# Patient Record
Sex: Female | Born: 1955 | Race: White | Hispanic: No | Marital: Married | Smoking: Never smoker
Health system: Southern US, Community
[De-identification: ages and names within clinical notes are randomized; demographics above are authoritative.]

## PROBLEM LIST (undated history)

## (undated) DIAGNOSIS — M1990 Unspecified osteoarthritis, unspecified site: Secondary | ICD-10-CM

## (undated) DIAGNOSIS — K219 Gastro-esophageal reflux disease without esophagitis: Secondary | ICD-10-CM

## (undated) DIAGNOSIS — Z9889 Other specified postprocedural states: Secondary | ICD-10-CM

## (undated) DIAGNOSIS — E079 Disorder of thyroid, unspecified: Secondary | ICD-10-CM

## (undated) DIAGNOSIS — Z923 Personal history of irradiation: Secondary | ICD-10-CM

## (undated) DIAGNOSIS — N189 Chronic kidney disease, unspecified: Secondary | ICD-10-CM

## (undated) DIAGNOSIS — E785 Hyperlipidemia, unspecified: Secondary | ICD-10-CM

## (undated) DIAGNOSIS — D649 Anemia, unspecified: Secondary | ICD-10-CM

## (undated) DIAGNOSIS — I10 Essential (primary) hypertension: Secondary | ICD-10-CM

## (undated) DIAGNOSIS — M109 Gout, unspecified: Secondary | ICD-10-CM

## (undated) DIAGNOSIS — G709 Myoneural disorder, unspecified: Secondary | ICD-10-CM

## (undated) DIAGNOSIS — R112 Nausea with vomiting, unspecified: Secondary | ICD-10-CM

## (undated) DIAGNOSIS — C50919 Malignant neoplasm of unspecified site of unspecified female breast: Secondary | ICD-10-CM

## (undated) DIAGNOSIS — M545 Low back pain, unspecified: Secondary | ICD-10-CM

## (undated) HISTORY — DX: Unspecified osteoarthritis, unspecified site: M19.90

## (undated) HISTORY — PX: SHOULDER SURGERY: SHX246

## (undated) HISTORY — DX: Nausea with vomiting, unspecified: R11.2

## (undated) HISTORY — PX: ABLATION: SHX5711

## (undated) HISTORY — DX: Low back pain, unspecified: M54.50

## (undated) HISTORY — DX: Anemia, unspecified: D64.9

## (undated) HISTORY — DX: Essential (primary) hypertension: I10

## (undated) HISTORY — DX: Gout, unspecified: M10.9

## (undated) HISTORY — DX: Chronic kidney disease, unspecified: N18.9

## (undated) HISTORY — DX: Disorder of thyroid, unspecified: E07.9

## (undated) HISTORY — DX: Myoneural disorder, unspecified: G70.9

## (undated) HISTORY — DX: Hyperlipidemia, unspecified: E78.5

## (undated) HISTORY — DX: Malignant neoplasm of unspecified site of unspecified female breast: C50.919

## (undated) HISTORY — PX: COLONOSCOPY: SHX174

## (undated) HISTORY — DX: Gastro-esophageal reflux disease without esophagitis: K21.9

## (undated) HISTORY — PX: DILATION AND CURETTAGE OF UTERUS: SHX78

## (undated) HISTORY — DX: Other specified postprocedural states: Z98.890

---

## 1997-11-27 ENCOUNTER — Ambulatory Visit (HOSPITAL_COMMUNITY): Admission: RE | Admit: 1997-11-27 | Discharge: 1997-11-27 | Payer: Self-pay | Admitting: Obstetrics and Gynecology

## 1998-12-05 ENCOUNTER — Ambulatory Visit (HOSPITAL_COMMUNITY): Admission: RE | Admit: 1998-12-05 | Discharge: 1998-12-05 | Payer: Self-pay | Admitting: Obstetrics and Gynecology

## 1998-12-05 ENCOUNTER — Encounter: Payer: Self-pay | Admitting: Obstetrics and Gynecology

## 1999-12-07 ENCOUNTER — Encounter: Payer: Self-pay | Admitting: Obstetrics and Gynecology

## 1999-12-07 ENCOUNTER — Ambulatory Visit (HOSPITAL_COMMUNITY): Admission: RE | Admit: 1999-12-07 | Discharge: 1999-12-07 | Payer: Self-pay | Admitting: Obstetrics and Gynecology

## 2000-12-12 ENCOUNTER — Encounter: Payer: Self-pay | Admitting: Obstetrics and Gynecology

## 2000-12-12 ENCOUNTER — Ambulatory Visit (HOSPITAL_COMMUNITY): Admission: RE | Admit: 2000-12-12 | Discharge: 2000-12-12 | Payer: Self-pay | Admitting: Obstetrics and Gynecology

## 2001-10-31 ENCOUNTER — Ambulatory Visit (HOSPITAL_BASED_OUTPATIENT_CLINIC_OR_DEPARTMENT_OTHER): Admission: RE | Admit: 2001-10-31 | Discharge: 2001-10-31 | Payer: Self-pay | Admitting: Orthopedic Surgery

## 2001-12-13 ENCOUNTER — Encounter: Payer: Self-pay | Admitting: Obstetrics and Gynecology

## 2001-12-13 ENCOUNTER — Ambulatory Visit (HOSPITAL_COMMUNITY): Admission: RE | Admit: 2001-12-13 | Discharge: 2001-12-13 | Payer: Self-pay | Admitting: Obstetrics and Gynecology

## 2003-01-04 ENCOUNTER — Encounter: Payer: Self-pay | Admitting: Obstetrics and Gynecology

## 2003-01-04 ENCOUNTER — Ambulatory Visit (HOSPITAL_COMMUNITY): Admission: RE | Admit: 2003-01-04 | Discharge: 2003-01-04 | Payer: Self-pay | Admitting: Obstetrics and Gynecology

## 2003-12-11 ENCOUNTER — Encounter: Admission: RE | Admit: 2003-12-11 | Discharge: 2003-12-11 | Payer: Self-pay | Admitting: Family Medicine

## 2004-01-27 ENCOUNTER — Ambulatory Visit (HOSPITAL_COMMUNITY): Admission: RE | Admit: 2004-01-27 | Discharge: 2004-01-27 | Payer: Self-pay | Admitting: Obstetrics and Gynecology

## 2004-03-01 ENCOUNTER — Ambulatory Visit (HOSPITAL_COMMUNITY): Admission: AD | Admit: 2004-03-01 | Discharge: 2004-03-01 | Payer: Self-pay | Admitting: Obstetrics and Gynecology

## 2004-03-01 ENCOUNTER — Encounter (INDEPENDENT_AMBULATORY_CARE_PROVIDER_SITE_OTHER): Payer: Self-pay | Admitting: Specialist

## 2005-01-14 ENCOUNTER — Encounter: Admission: RE | Admit: 2005-01-14 | Discharge: 2005-01-14 | Payer: Self-pay | Admitting: Family Medicine

## 2005-02-19 ENCOUNTER — Ambulatory Visit (HOSPITAL_COMMUNITY): Admission: RE | Admit: 2005-02-19 | Discharge: 2005-02-19 | Payer: Self-pay | Admitting: Obstetrics and Gynecology

## 2006-02-25 ENCOUNTER — Ambulatory Visit (HOSPITAL_COMMUNITY): Admission: RE | Admit: 2006-02-25 | Discharge: 2006-02-25 | Payer: Self-pay | Admitting: Obstetrics and Gynecology

## 2006-03-09 ENCOUNTER — Ambulatory Visit: Payer: Self-pay | Admitting: Gastroenterology

## 2006-03-22 ENCOUNTER — Ambulatory Visit: Payer: Self-pay | Admitting: Gastroenterology

## 2006-07-14 ENCOUNTER — Ambulatory Visit (HOSPITAL_COMMUNITY): Admission: RE | Admit: 2006-07-14 | Discharge: 2006-07-14 | Payer: Self-pay | Admitting: Obstetrics and Gynecology

## 2007-03-06 ENCOUNTER — Ambulatory Visit (HOSPITAL_COMMUNITY): Admission: RE | Admit: 2007-03-06 | Discharge: 2007-03-06 | Payer: Self-pay | Admitting: Obstetrics and Gynecology

## 2007-03-14 ENCOUNTER — Encounter: Admission: RE | Admit: 2007-03-14 | Discharge: 2007-03-14 | Payer: Self-pay | Admitting: Obstetrics and Gynecology

## 2007-09-03 IMAGING — US US TRANSVAGINAL NON-OB
1 series · 13 of 25 positions shown · non-contrast
Comparison: none

CLINICAL DATA: Persistent bleeding since 06/06/06.  On birth control pills for 1 month.  
 TRANSABDOMINAL AND TRANSVAGINAL PELVIC ULTRASOUND:
TECHNIQUE: Both transabdominal and transvaginal ultrasound examinations of the pelvis were performed including evaluation of the uterus, ovaries, adnexal regions, and pelvic cul-de-sac.

[Series 1: us transvaginal non-ob · 0.30mm/px · 13 of 44 slices shown]
[im 1/44]
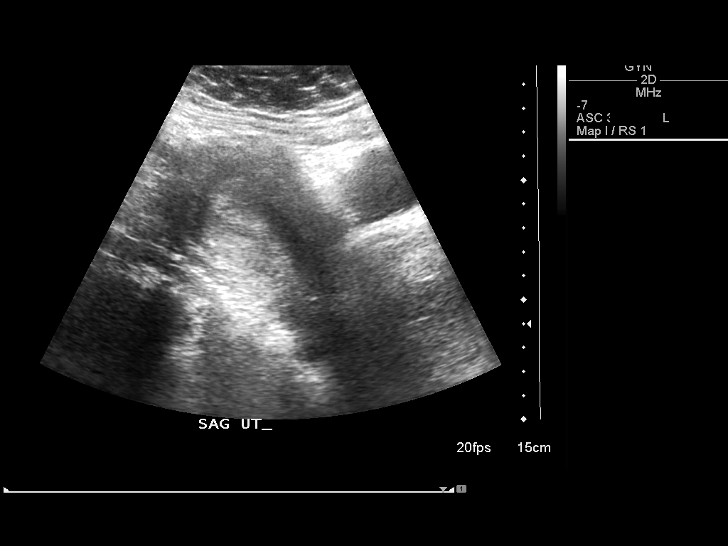
[im 4/44]
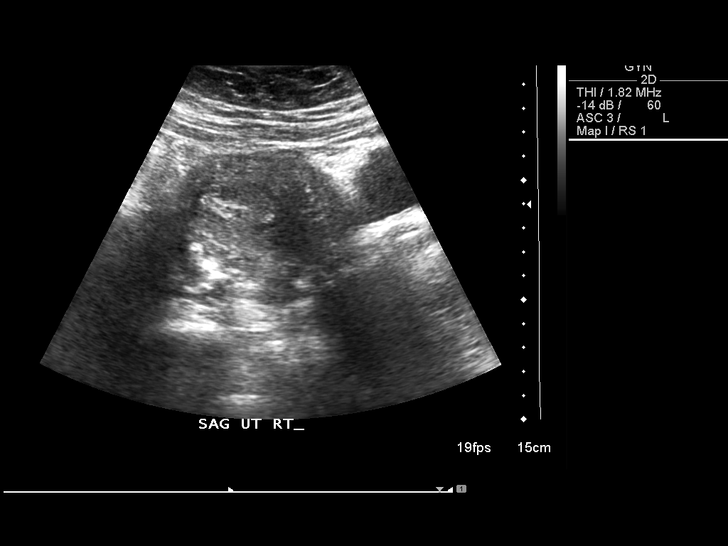
[im 8/44]
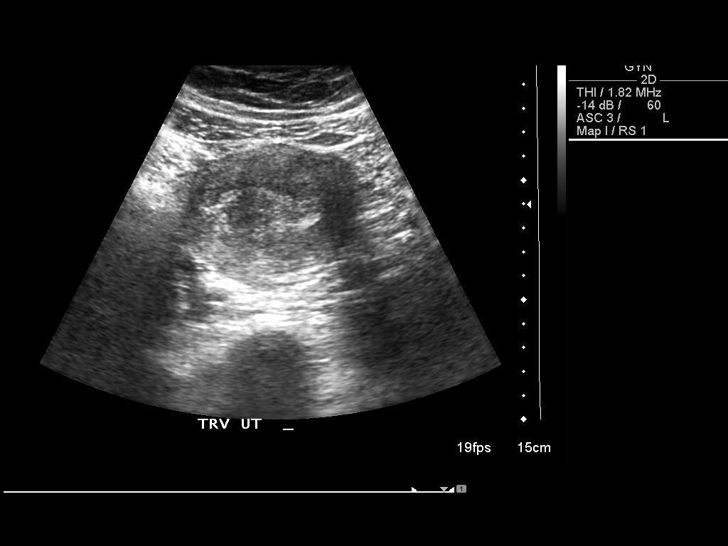
[im 11/44]
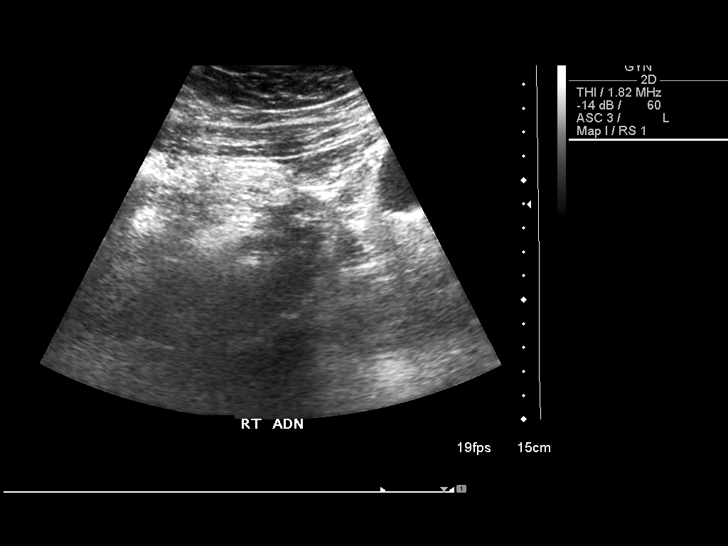
[im 15/44]
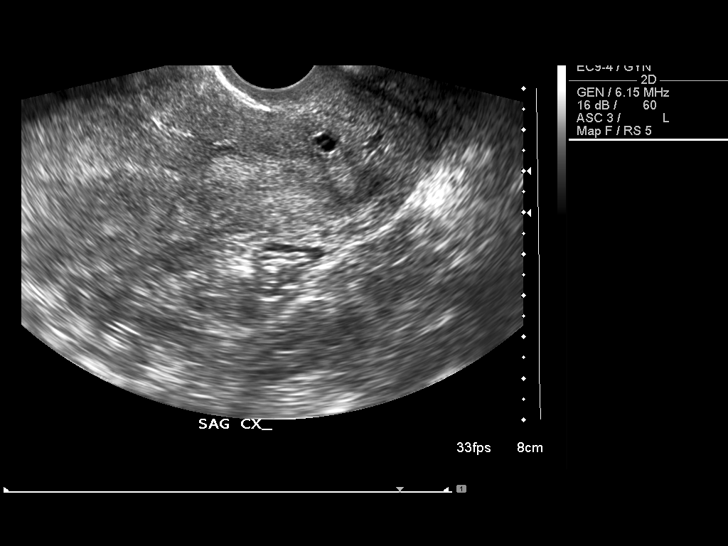
[im 18/44]
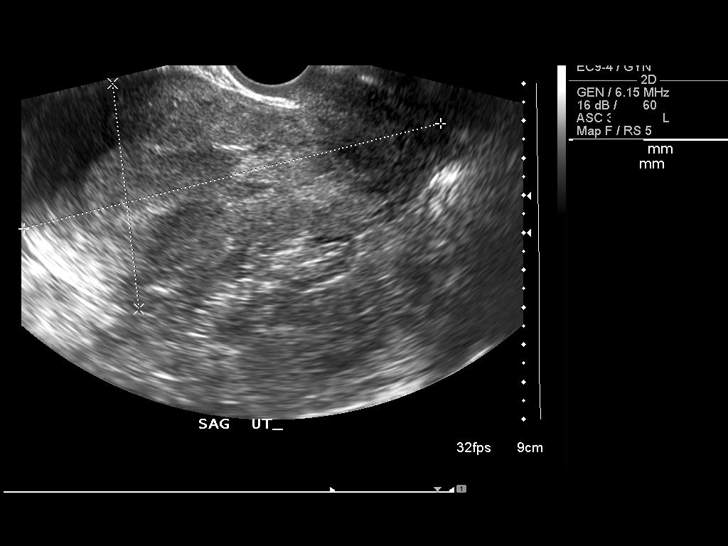
[im 22/44]
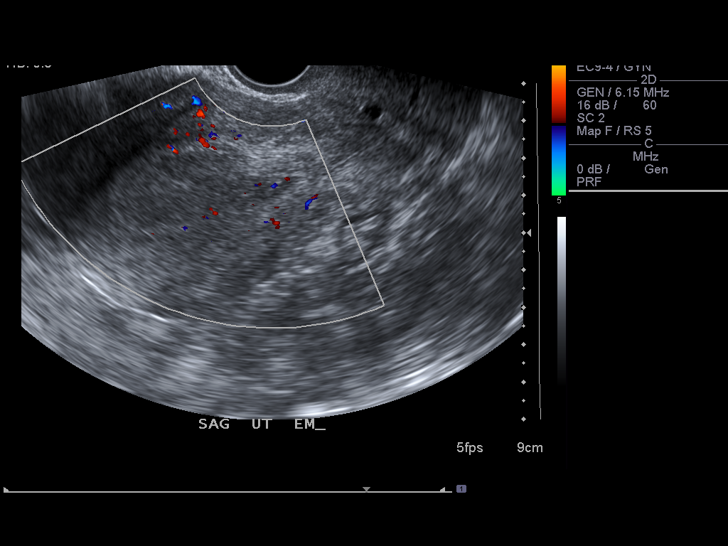
[im 26/44]
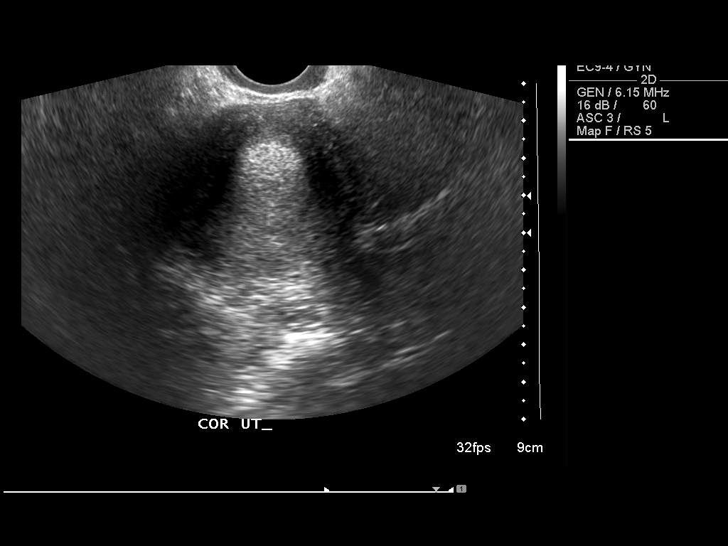
[im 29/44]
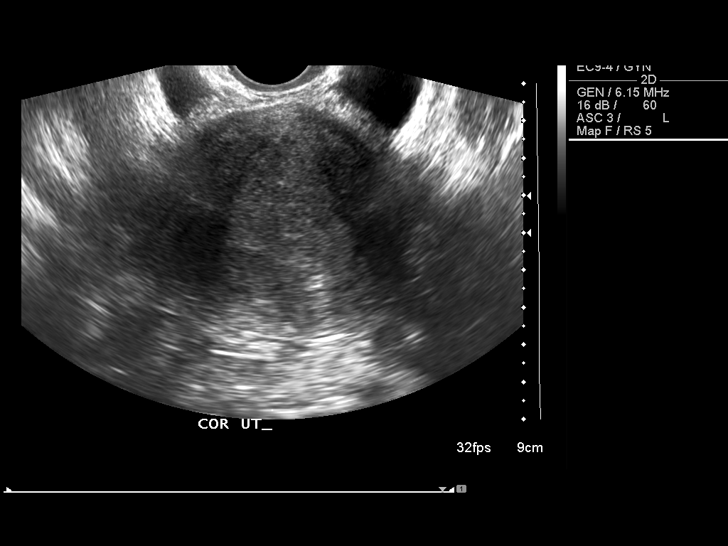
[im 33/44]
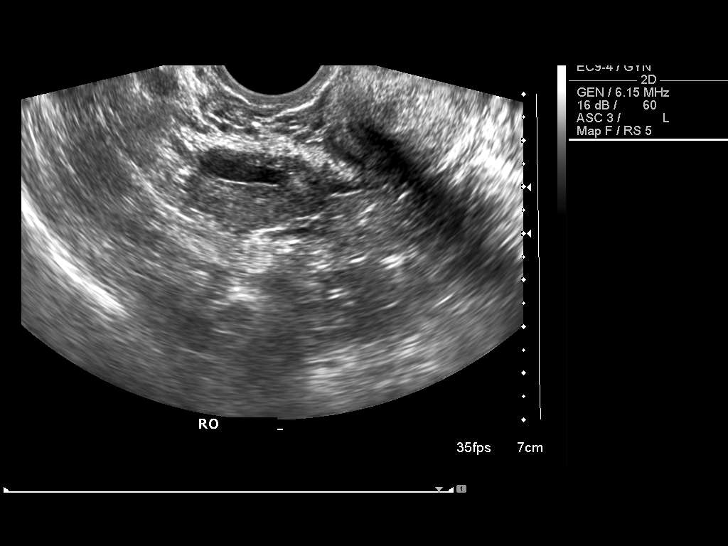
[im 36/44]
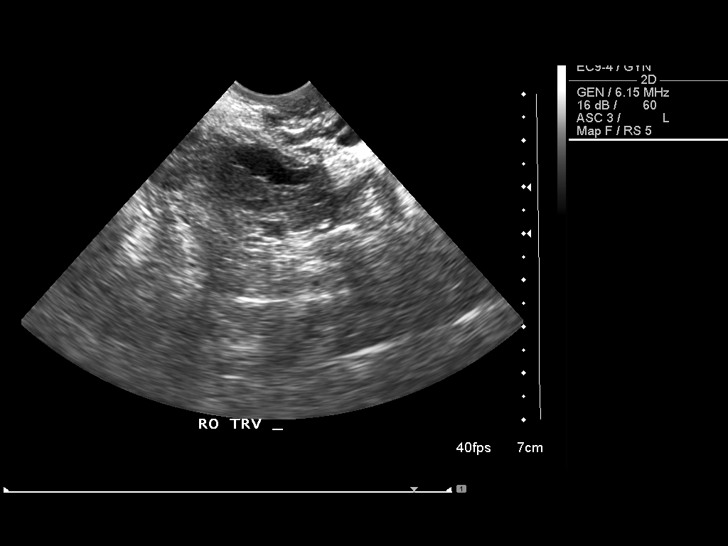
[im 40/44]
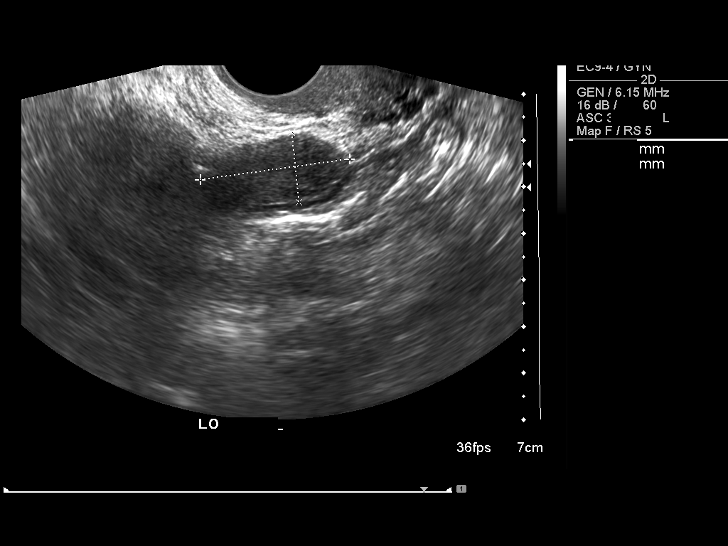
[im 44/44]
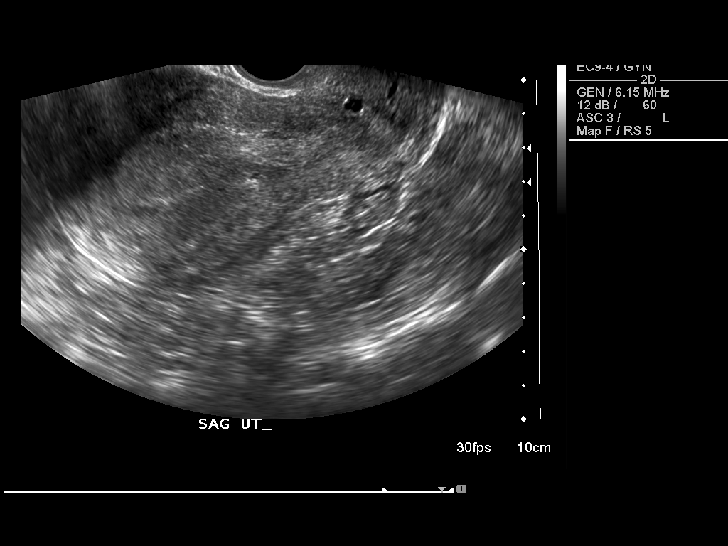

[13 of 25 positions shown; findings below may reference images not displayed]

FINDINGS: The uterus has a sagittal length of 11.0 cm, an AP width of 6.2 cm, and a transverse width of 7.2 cm.  The uterine myometrium appears homogeneous in nature.  
 The endometrial canal appears distended by mixed isoechoic and hyperechoic material, which demonstrated movement during the course of scanning and is most compatible with subacute and acute blood within the endometrial canal.  Because of the distention of the endometrial lining by this blood, the visualization of the complete endometrial lining is somewhat suboptimal.  The portion of the endometrial lining which is visualized appears thin with a single layer measurement of 3.9 mm.  No definite areas of focal thickening, inhomogeneity or abnormal vascularity are identified associated with the endometrial lining, but again the presence of the intraluminal blood decreases sensitivity of the overall evaluation.  
 The ovaries both are seen and have a normal appearance with the right ovary measuring 3.1 x 1.6 x 2.6 cm and the left ovary measuring 3.2 x 1.5 x 1.9 cm.  No cul-de-sac or periovarian fluid is seen and no separate adnexal masses are noted.
IMPRESSION: 1.  Homogeneous uterine myometrium.
 2.  Findings suggestive of subacute and acute blood within the endometrial canal with no definite focal endometrial abnormality identified.  Please see above report for more complete discussion.  
 3.  Normal ovaries.

## 2007-09-12 ENCOUNTER — Encounter (INDEPENDENT_AMBULATORY_CARE_PROVIDER_SITE_OTHER): Payer: Self-pay | Admitting: Diagnostic Radiology

## 2007-09-12 ENCOUNTER — Encounter: Admission: RE | Admit: 2007-09-12 | Discharge: 2007-09-12 | Payer: Self-pay | Admitting: Family Medicine

## 2008-03-20 ENCOUNTER — Encounter: Admission: RE | Admit: 2008-03-20 | Discharge: 2008-03-20 | Payer: Self-pay | Admitting: Obstetrics and Gynecology

## 2009-04-02 ENCOUNTER — Encounter: Admission: RE | Admit: 2009-04-02 | Discharge: 2009-04-02 | Payer: Self-pay | Admitting: Obstetrics and Gynecology

## 2010-04-30 ENCOUNTER — Encounter
Admission: RE | Admit: 2010-04-30 | Discharge: 2010-04-30 | Payer: Self-pay | Source: Home / Self Care | Attending: Internal Medicine | Admitting: Internal Medicine

## 2010-09-25 NOTE — Op Note (Signed)
NAMEMALEIA, Jo Garcia            ACCOUNT NO.:  [id number]   MEDICAL RECORD NO.:  [id number]          PATIENT TYPE:  AMB   LOCATION:  SDC                           FACILITY:  [facility]   PHYSICIAN:  Howard C. Mezer, M.D.  DATE OF BIRTH:  1956-02-23   DATE OF PROCEDURE:  03/01/2004  DATE OF DISCHARGE:                                 OPERATIVE REPORT   INDICATIONS FOR SURGERY:  The patient is a 54 year old female, presenting  with severe menorrhagia and a falling hemoglobin, who is admitted for a D&C.   PREOPERATIVE DIAGNOSIS:  Menorrhagia.   POSTOPERATIVE DIAGNOSIS:  Menorrhagia.   OPERATION PERFORMED:  D&C.   ANESTHESIA:  Local paracervical block.   SURGEON:  Dr. Teodora Medici   PREPARATION:  Betadine.   DESCRIPTION OF PROCEDURE:  With the patient in the lithotomy position, she  was prepped and draped in the routine fashion.  A paracervical block of 1%  Xylocaine was placed without difficulty.  Unfortunately, small suction  curettes were not available in the OR, and a 7 mm rigid curved cannula was  the smallest that was available.  This required a dilation of the cervix.  The uterus sounded to 9.5 cm.  The suction catheter was introduced, and the  contents of the uterus were evacuated.  There was a large amount of blood  and clots, although there did not appear to be a significant amount of  tissue.  The patient tolerated this very well, and the cavity was lightly  curetted with a sharp curette productive of no further tissue.  A Randall-  Stone forceps was introduced, and no polyps or fibroids could be identified.  The cavity did feel to be somewhat irregular.  There was some minimal  bleeding at the end of the procedure.  The estimated blood loss was  approximately 25 mL from the procedure.  The sponge, instrument, and needle  counts were correct.  The patient tolerated the procedure well and was taken  to recovery room in satisfactory condition.     HCM/MEDQ  D:  03/01/2004   T:  03/01/2004  Job:  [id number]   cc:   S. Kyra Manges, M.D.  [phone] [address]  [place]  [place] [address]  Fax: [phone]

## 2010-09-25 NOTE — Op Note (Signed)
[place]  Patient:    Jo Garcia, Jo Garcia Visit Number: [id number] MRN: [id number]          Service Type: DSU Location: [facility] Attending Physician:  Twana First Dictated by:   Elana Alm Thurston Hole, M.D. Proc. Date: 10/31/01 Admit Date:  10/31/2001                             Operative Report  PREOPERATIVE DIAGNOSIS:  Right shoulder partial rotator cuff tear with impingement.  POSTOPERATIVE DIAGNOSIS:  Right shoulder rotator cuff tendinitis with impingement.  OPERATION:  Right shoulder exam under anesthesia, followed by arthroscopic subacromial decompression.  SURGEON:  Salvatore Marvel, M.D.  ASSISTANT:  Julien Girt, P.A.  ANESTHESIA:  General.  OPERATIVE TIME:  30 minutes.  COMPLICATIONS:  None.  INDICATIONS:  The patient is a 55 year old woman who has had significant right shoulder pain for the past six to eight months, increasing in nature, with signs and symptoms and MRI documenting a partial rotator cuff tear versus impingement, with failed conservative care and is now to undergo arthroscopy.  DESCRIPTION OF PROCEDURE:  The patient was brought to the operating room on October 31, 2001, after a supraclavicular block had been placed in the holding room.  She was placed on the operative table in the supine position.  Her right shoulder was examined under anesthesia.  She had full range of motion, and her shoulder was stable to ligamentous exam.  She was then placed in a beach-chair position, and her shoulder and arm were ______ prepped and draped using sterile technique.  Initially, arthroscopy was performed through posterior arthroscopic portal.  The arthroscope with the pump attached was placed into an anterior portal, and an arthroscopic probe was placed.  On initial inspection the articular cartilage in the glenohumeral joint was found to be intact, anterior and posterior labrum intact, superior labrum, biceps tendon  anchor was intact.  Inferior labrum and anteroinferior glenohumeral ligament complex were intact.  Rotator cuff was thoroughly inspected, but there was found to be no evidence of a tear.  Inferior capsule recess was free of pathology.  Subacromial space was entered, and a lateral arthroscopic portal was made.  A large amount of bursitis was resected.  Underneath this the rotator cuff was inflamed and somewhat frayed but with no evidence of a tear.  Subacromial decompression was carried out removing 6-8 mm of the undersurface of the anterior, anterolateral, and anteromedial acromion, and CA ligament release carried out as well.  The [facility] joint was not disturbed.  After this was done, shoulder groove brought through a full range of motion with no impingement on the rotator cuff.  At this point it is felt that all pathology had been satisfactorily addressed.  Instruments were removed.  Portals closed with 3-0 nylon suture and injected with 0.25% Marcaine with epinephrine. Sterile dressings and a sling applied.  The patient awakened and taken to the recovery room in stable condition.  FOLLOW-UP:  The patient will be followed as an outpatient, on Vicodin and Naprosyn.  Will see her back in the office in a week for sutures out and follow-up. Dictated by:   Elana Alm Thurston Hole, M.D. Attending Physician:  Twana First DD:  10/31/01 TD:  11/01/01 Job: [id number]

## 2011-04-15 ENCOUNTER — Other Ambulatory Visit: Payer: Self-pay | Admitting: Internal Medicine

## 2011-04-15 DIAGNOSIS — Z1231 Encounter for screening mammogram for malignant neoplasm of breast: Secondary | ICD-10-CM

## 2011-05-12 ENCOUNTER — Ambulatory Visit
Admission: RE | Admit: 2011-05-12 | Discharge: 2011-05-12 | Disposition: A | Discharge status: Home / Self Care | Payer: BC Managed Care – PPO | Source: Ambulatory Visit | Attending: Internal Medicine | Admitting: Internal Medicine

## 2011-05-12 DIAGNOSIS — Z1231 Encounter for screening mammogram for malignant neoplasm of breast: Secondary | ICD-10-CM

## 2012-04-04 ENCOUNTER — Other Ambulatory Visit: Payer: Self-pay | Admitting: Internal Medicine

## 2012-04-04 DIAGNOSIS — Z1231 Encounter for screening mammogram for malignant neoplasm of breast: Secondary | ICD-10-CM

## 2012-05-12 ENCOUNTER — Ambulatory Visit
Admission: RE | Admit: 2012-05-12 | Discharge: 2012-05-12 | Disposition: A | Discharge status: Home / Self Care | Payer: BC Managed Care – PPO | Source: Ambulatory Visit | Attending: Internal Medicine | Admitting: Internal Medicine

## 2012-05-12 DIAGNOSIS — Z1231 Encounter for screening mammogram for malignant neoplasm of breast: Secondary | ICD-10-CM

## 2013-05-02 ENCOUNTER — Other Ambulatory Visit: Payer: Self-pay

## 2013-05-02 DIAGNOSIS — Z1231 Encounter for screening mammogram for malignant neoplasm of breast: Secondary | ICD-10-CM

## 2013-05-28 ENCOUNTER — Ambulatory Visit
Admission: RE | Admit: 2013-05-28 | Discharge: 2013-05-28 | Disposition: A | Discharge status: Home / Self Care | Payer: BC Managed Care – PPO | Source: Ambulatory Visit

## 2013-05-28 DIAGNOSIS — Z1231 Encounter for screening mammogram for malignant neoplasm of breast: Secondary | ICD-10-CM

## 2013-08-16 ENCOUNTER — Encounter: Payer: Self-pay | Admitting: Podiatry

## 2013-08-16 ENCOUNTER — Ambulatory Visit (INDEPENDENT_AMBULATORY_CARE_PROVIDER_SITE_OTHER): Payer: BC Managed Care – PPO

## 2013-08-16 ENCOUNTER — Ambulatory Visit (INDEPENDENT_AMBULATORY_CARE_PROVIDER_SITE_OTHER): Payer: BC Managed Care – PPO | Admitting: Podiatry

## 2013-08-16 ENCOUNTER — Other Ambulatory Visit: Payer: Self-pay | Admitting: Podiatry

## 2013-08-16 VITALS — BP 110/65 | HR 58 | Resp 16 | Ht 65.0 in | Wt 185.0 lb

## 2013-08-16 DIAGNOSIS — M779 Enthesopathy, unspecified: Secondary | ICD-10-CM

## 2013-08-16 DIAGNOSIS — M2040 Other hammer toe(s) (acquired), unspecified foot: Secondary | ICD-10-CM

## 2013-08-16 MED ORDER — TRIAMCINOLONE ACETONIDE 10 MG/ML IJ SUSP
10.0000 mg | Freq: Once | INTRAMUSCULAR | Status: AC
  Administered 2013-08-16: 10 mg

## 2013-08-16 NOTE — Progress Notes (Signed)
   Subjective:    Patient ID: Jo Garcia, female    DOB: 1955-10-01, 58 y.o.   MRN: [id number]  HPI Comments: "My toes are moving apart"  Patient c/o aching plantar forefoot and 2nd and 3rd toes right foot for a couple years. The toes are separating from each other and has gotten more painful for the last few months. The forefoot sometimes is swollen. The 2nd toe is hammered and is rubbing on her shoes. She says the further apart they get, the more painful the lateral side of her foot gets. She has tried different shoes with wider toe box and also massages the area.     Review of Systems  Musculoskeletal: Positive for arthralgias.  Allergic/Immunologic: Positive for environmental allergies.  All other systems reviewed and are negative.      Objective:   Physical Exam        Assessment & Plan:

## 2013-08-16 NOTE — Progress Notes (Signed)
Subjective:     Patient ID: Jo Garcia, female   DOB: Sep 20, 1955, 58 y.o.   MRN: [id number]  Foot Pain   patient presents stating I'm having a lot of pain in this joint and this toe has really moved over the last year. Patient has a history of gout and is now on high-dose allopurinol which is helped nodules that she was developing   Review of Systems  All other systems reviewed and are negative.      Objective:   Physical Exam  Nursing note and vitals reviewed. Constitutional: She is oriented to person, place, and time.  Cardiovascular: Intact distal pulses.   Musculoskeletal: Normal range of motion.  Neurological: She is oriented to person, place, and time.  Skin: Skin is warm.   neurovascular status intact with muscle strength adequate and range of motion of the subtalar and midtarsal joint within normal limits. Patient has elevated medial dislocation of the second MPJ right with inflammation and fluid within the second MPJ surface upon palpation. Fill time to the digits is normal and the arch height is found to be within normal limits     Assessment:     Probable flexor plate dislocation second MPJ right with acute capsulitis-like symptoms    Plan:     Reviewed H&P and x-rays. Recommended injection treatment explaining that ultimately we may need to use this digit and shorten the second metatarsal bone. Patient understands this and will try conservative care first with considerations long-term for orthotics. Infiltrated 60 mg Xylocaine Marcaine mixture under sterile technique aspirated the joint was able to get out a small amount of clear fluid and injected with a quarter cc of dexamethasone Kenalog and applied thick plantar padding. Reappoint 2 weeks

## 2013-09-06 ENCOUNTER — Encounter: Payer: Self-pay | Admitting: Podiatry

## 2013-09-06 ENCOUNTER — Ambulatory Visit (INDEPENDENT_AMBULATORY_CARE_PROVIDER_SITE_OTHER): Payer: BC Managed Care – PPO | Admitting: Podiatry

## 2013-09-06 VITALS — BP 111/59 | HR 63 | Resp 16 | Ht 65.0 in | Wt 185.0 lb

## 2013-09-06 DIAGNOSIS — M2040 Other hammer toe(s) (acquired), unspecified foot: Secondary | ICD-10-CM

## 2013-09-06 DIAGNOSIS — M779 Enthesopathy, unspecified: Secondary | ICD-10-CM

## 2013-09-07 NOTE — Progress Notes (Signed)
Subjective:     Patient ID: Jo Garcia, female   DOB: 1956/03/20, 58 y.o.   MRN: [id number]  HPI patient presents stating I'm improved but is still getting pain if him on my foot 2 much. Patient is found to have inflammation and pain around the lesser MPJ   Review of Systems     Objective:   Physical Exam Neurovascular status intact with no health history changes noted and moderate discomfort still noted upon deep palpation    Assessment:     Continued capsulitis noted foot    Plan:     Advised patient on treatment options and recommended long-term orthotics to disperse weight off the joint and try to maintain what we've done so far. Patient wants these and is scanned for custom orthotics and I then went ahead and dispensed further metatarsal padding to keep pressure off the joint until orthotics are returned

## 2013-10-05 ENCOUNTER — Ambulatory Visit (INDEPENDENT_AMBULATORY_CARE_PROVIDER_SITE_OTHER): Payer: BC Managed Care – PPO | Admitting: *Deleted

## 2013-10-05 DIAGNOSIS — M2040 Other hammer toe(s) (acquired), unspecified foot: Secondary | ICD-10-CM

## 2013-10-05 NOTE — Progress Notes (Signed)
   Subjective:    Patient ID: Jo Garcia, female    DOB: 29-Jul-1955, 59 y.o.   MRN: [id number]  HPI PICK UP ORTHOTICS AND GIVEN INSTRUCTION.   Review of Systems     Objective:   Physical Exam        Assessment & Plan:

## 2013-10-05 NOTE — Patient Instructions (Signed)

## 2013-10-29 ENCOUNTER — Ambulatory Visit: Payer: BC Managed Care – PPO | Admitting: Podiatry

## 2014-05-27 ENCOUNTER — Other Ambulatory Visit: Payer: Self-pay

## 2014-05-27 DIAGNOSIS — Z1231 Encounter for screening mammogram for malignant neoplasm of breast: Secondary | ICD-10-CM

## 2014-05-31 ENCOUNTER — Ambulatory Visit: Payer: BC Managed Care – PPO

## 2014-06-04 ENCOUNTER — Ambulatory Visit: Payer: BC Managed Care – PPO

## 2014-06-24 ENCOUNTER — Ambulatory Visit: Payer: BC Managed Care – PPO

## 2014-07-09 ENCOUNTER — Ambulatory Visit: Payer: BC Managed Care – PPO

## 2014-07-10 ENCOUNTER — Ambulatory Visit
Admission: RE | Admit: 2014-07-10 | Discharge: 2014-07-10 | Disposition: A | Discharge status: Home / Self Care | Payer: BC Managed Care – PPO | Source: Ambulatory Visit

## 2014-07-10 DIAGNOSIS — Z1231 Encounter for screening mammogram for malignant neoplasm of breast: Secondary | ICD-10-CM

## 2015-06-24 ENCOUNTER — Other Ambulatory Visit: Payer: Self-pay | Admitting: Internal Medicine

## 2015-06-24 DIAGNOSIS — R5381 Other malaise: Secondary | ICD-10-CM

## 2015-10-29 ENCOUNTER — Encounter: Payer: Self-pay | Admitting: Podiatry

## 2015-10-29 ENCOUNTER — Ambulatory Visit (INDEPENDENT_AMBULATORY_CARE_PROVIDER_SITE_OTHER): Payer: BC Managed Care – PPO | Admitting: Podiatry

## 2015-10-29 ENCOUNTER — Ambulatory Visit (INDEPENDENT_AMBULATORY_CARE_PROVIDER_SITE_OTHER): Payer: BC Managed Care – PPO

## 2015-10-29 VITALS — BP 117/64 | HR 54 | Resp 16

## 2015-10-29 DIAGNOSIS — M79675 Pain in left toe(s): Secondary | ICD-10-CM

## 2015-10-29 DIAGNOSIS — M779 Enthesopathy, unspecified: Secondary | ICD-10-CM | POA: Diagnosis not present

## 2015-10-29 MED ORDER — TRIAMCINOLONE ACETONIDE 10 MG/ML IJ SUSP
10.0000 mg | Freq: Once | INTRAMUSCULAR | Status: AC
  Administered 2015-10-29: 10 mg

## 2015-10-29 NOTE — Progress Notes (Signed)
Subjective:     Patient ID: Jo Garcia, female   DOB: April 20, 1956, 60 y.o.   MRN: [id number]  HPI patient presents with pain on the dorsum of the left foot that is occurring of the midtarsal joint with radiating discomfort into the hallux and second toe and first interspace   Review of Systems     Objective:   Physical Exam Neurovascular status intact muscle strength adequate with inflammation of the left midtarsal joint with probable irritation of the superficial peroneal nerve creating irritation distal    Assessment:     H&P and x-rays reviewed.    Plan:     Careful sheath injection administered of the dorsal tendon complex midtarsal joint which was tolerated well and recommended heat and ice therapy and wider-type shoes. Reappoint if symptoms persist  Drains indicate that there is some reactivity around the midtarsal joint with probable arthritic condition

## 2015-12-29 ENCOUNTER — Encounter: Payer: Self-pay | Admitting: Gastroenterology

## 2016-04-29 ENCOUNTER — Ambulatory Visit: Payer: BC Managed Care – PPO | Admitting: Rheumatology

## 2016-04-30 DIAGNOSIS — Z8639 Personal history of other endocrine, nutritional and metabolic disease: Secondary | ICD-10-CM | POA: Insufficient documentation

## 2016-04-30 DIAGNOSIS — Z8679 Personal history of other diseases of the circulatory system: Secondary | ICD-10-CM | POA: Insufficient documentation

## 2016-04-30 DIAGNOSIS — M19079 Primary osteoarthritis, unspecified ankle and foot: Secondary | ICD-10-CM | POA: Insufficient documentation

## 2016-04-30 DIAGNOSIS — M19041 Primary osteoarthritis, right hand: Secondary | ICD-10-CM | POA: Insufficient documentation

## 2016-04-30 DIAGNOSIS — M19042 Primary osteoarthritis, left hand: Principal | ICD-10-CM | POA: Insufficient documentation

## 2016-04-30 DIAGNOSIS — M170 Bilateral primary osteoarthritis of knee: Secondary | ICD-10-CM | POA: Insufficient documentation

## 2016-04-30 DIAGNOSIS — E559 Vitamin D deficiency, unspecified: Secondary | ICD-10-CM | POA: Insufficient documentation

## 2016-04-30 DIAGNOSIS — M1A09X0 Idiopathic chronic gout, multiple sites, without tophus (tophi): Secondary | ICD-10-CM | POA: Insufficient documentation

## 2016-04-30 DIAGNOSIS — Z87442 Personal history of urinary calculi: Secondary | ICD-10-CM | POA: Insufficient documentation

## 2016-04-30 NOTE — Progress Notes (Signed)
Office Visit Note  Patient: Jo Garcia             Date of Birth: 1956/04/10           MRN: [id number]             PCP: Haywood Pao, MD Referring: Haywood Pao, MD Visit Date: 05/11/2016 Occupation: @GUAROCC @    Subjective:  Follow-up 6 month follow-up for osteoarthritis of knees hands and feet. Using Voltaren gel or Voltaren pills with good results.  History of Present Illness: Jo Garcia is a 60 y.o. female  Last seen 12/02/2015 Doing relatively well with the hallway of the knee joint hands and feet. During the weather change, patient ends up having some discomfort. It's not too bad and can do without Voltaren pills or gel for most part but she uses it when her pain is severe. She knows how to use the medication properly. She is using one or the other but not both at the same time. She uses the pills sparingly.  Patient has a history of OA of the knee joint and has had seen [facility] orthopedics and received Visco supplementation about 6 months ago. Patient is still doing well after those injections.   Activities of Daily Living:  Patient reports morning stiffness for 15 minutes.   Patient Reports nocturnal pain.  Difficulty dressing/grooming: Denies Difficulty climbing stairs: Denies Difficulty getting out of chair: Denies Difficulty using hands for taps, buttons, cutlery, and/or writing: Reports   Review of Systems  Constitutional: Negative for fatigue.  HENT: Negative for mouth sores and mouth dryness.   Eyes: Negative for dryness.  Respiratory: Negative for shortness of breath.   Gastrointestinal: Negative for constipation and diarrhea.  Musculoskeletal: Negative for myalgias and myalgias.  Skin: Negative for sensitivity to sunlight.  Psychiatric/Behavioral: Negative for decreased concentration and sleep disturbance.    PMFS History:  Patient Active Problem List   Diagnosis Date Noted  . Osteoarthritis of foot 04/30/2016  .  History of hypertension 04/30/2016  . History of renal calculi 04/30/2016  . Vitamin D deficiency 04/30/2016  . History of hypothyroidism 04/30/2016  . Primary osteoarthritis of both hands 04/30/2016  . Primary osteoarthritis of both knees 04/30/2016  . Idiopathic chronic gout of multiple sites without tophus 04/30/2016    Past Medical History:  Diagnosis Date  . Osteoarthritis     Family History  Problem Relation Age of Onset  . Heart attack Father    Past Surgical History:  Procedure Laterality Date  . ABLATION    . TOTAL SHOULDER ARTHROPLASTY     Social History   Social History Narrative  . No narrative on file     Objective: Vital Signs: BP 117/69 (BP Location: Left Arm, Patient Position: Sitting, Cuff Size: Normal)   Pulse (!) 50   Resp 14   Ht 5\' 5"  (1.651 m)   Wt 180 lb (81.6 kg)   BMI 29.95 kg/m    Physical Exam  Constitutional: She is oriented to person, place, and time. She appears well-developed and well-nourished.  HENT:  Head: Normocephalic and atraumatic.  Eyes: EOM are normal. Pupils are equal, round, and reactive to light.  Cardiovascular: Normal rate, regular rhythm and normal heart sounds.  Exam reveals no gallop and no friction rub.   No murmur heard. Pulmonary/Chest: Effort normal and breath sounds normal. She has no wheezes. She has no rales.  Abdominal: Soft. Bowel sounds are normal. She exhibits no distension. There is no  tenderness. There is no guarding. No hernia.  Musculoskeletal: Normal range of motion. She exhibits no edema, tenderness or deformity.  Lymphadenopathy:    She has no cervical adenopathy.  Neurological: She is alert and oriented to person, place, and time. Coordination normal.  Skin: Skin is warm and dry. Capillary refill takes less than 2 seconds. No rash noted.  Psychiatric: She has a normal mood and affect. Her behavior is normal.  Nursing note and vitals reviewed.    Musculoskeletal Exam:  Full range of motion of all  joints Grip strength is equal and strong bilaterally Fiber myalgia tender points are all absent  CDAI Exam: CDAI Homunculus Exam:   Swelling:  Right hand: 1st MCP, 2nd MCP, 3rd MCP, 4th MCP and 5th MCP Left hand: 1st MCP, 2nd MCP, 3rd MCP, 4th MCP and 5th MCP  Joint Counts:  CDAI Tender Joint count: 0 CDAI Swollen Joint count: 10  Global Assessments:  Patient Global Assessment: 2 Provider Global Assessment: 2  CDAI Calculated Score: 14    Investigation: No additional findings.   Imaging: No results found.  Speciality Comments: No specialty comments available.    Procedures:  No procedures performed Allergies: Penicillins; Sulfa antibiotics; and Codeine   Assessment / Plan:     Visit Diagnoses: Primary osteoarthritis of both hands  Primary osteoarthritis of both knees  Primary osteoarthritis of both feet  Idiopathic chronic gout of multiple sites without tophus  History of hypertension  History of renal calculi - Plan: CBC with Differential/Platelet, COMPLETE METABOLIC PANEL WITH GFR  Vitamin D deficiency  History of hypothyroidism  Encounter for long-term current use of medication - Plan: CBC with Differential/Platelet, COMPLETE METABOLIC PANEL WITH GFR, CBC with Differential/Platelet, COMPLETE METABOLIC PANEL WITH GFR   Plan: #1: Patient is doing well with the OA of the hands, feet. Using Voltaren pills for Voltaren gel sparingly.  #2: We haven't had CMP for this patient since June 2016. She is agreeable to get CBC with differential CMP with GFR for records. She will get full physical exam through her PCPs office coming up in February 2018 and I've asked the patient to remind them that these labs were done today so that'll have to duplicate them. Patient is agreeable  #3: Patient has OA of the knee joint. She is doing really well after the injections received from [facility] about June July 2017.  #4: Refill diclofenac pills 75 mg 1 by  mouth twice a day when necessary dispense 60 pills with 5 refills.  #5: Patient has enough Voltaren gel at this time he does not require refill  #6: CBC with differential CMP with GFR today. Note: I will send a message to the nurse to fax these labs to patient's PCP so they're in her record in case her PCP is not on Epic and does not have access to these medications  #7: Return to clinic in 6 months for follow-up and sooner if there is any issues questions concerns or problems.   Orders: Orders Placed This Encounter  Procedures  . CBC with Differential/Platelet  . COMPLETE METABOLIC PANEL WITH GFR  . CBC with Differential/Platelet  . COMPLETE METABOLIC PANEL WITH GFR   Meds ordered this encounter  Medications  . diclofenac (VOLTAREN) 75 MG EC tablet    Sig: Take 1 tablet (75 mg total) by mouth 2 (two) times daily as needed. Reported on 10/29/2015    Dispense:  60 tablet    Refill:  5    Order  Specific Question:   Supervising Provider    Answer:   Bo Merino [phone]    Face-to-face time spent with patient was 30 minutes. 50% of time was spent in counseling and coordination of care.  Follow-Up Instructions: Return in about 6 months (around 11/08/2016) for OA KJ, Hands, Feet ; diclofenac & voltaren gel.   Eliezer Lofts, PA-C

## 2016-05-11 ENCOUNTER — Encounter: Payer: Self-pay | Admitting: Rheumatology

## 2016-05-11 ENCOUNTER — Ambulatory Visit: Payer: BC Managed Care – PPO | Admitting: Rheumatology

## 2016-05-11 VITALS — BP 117/69 | HR 50 | Resp 14 | Ht 65.0 in | Wt 180.0 lb

## 2016-05-11 DIAGNOSIS — M19041 Primary osteoarthritis, right hand: Secondary | ICD-10-CM

## 2016-05-11 DIAGNOSIS — M1A09X0 Idiopathic chronic gout, multiple sites, without tophus (tophi): Secondary | ICD-10-CM

## 2016-05-11 DIAGNOSIS — Z8639 Personal history of other endocrine, nutritional and metabolic disease: Secondary | ICD-10-CM

## 2016-05-11 DIAGNOSIS — M19071 Primary osteoarthritis, right ankle and foot: Secondary | ICD-10-CM

## 2016-05-11 DIAGNOSIS — M19042 Primary osteoarthritis, left hand: Principal | ICD-10-CM

## 2016-05-11 DIAGNOSIS — M19072 Primary osteoarthritis, left ankle and foot: Secondary | ICD-10-CM

## 2016-05-11 DIAGNOSIS — Z8679 Personal history of other diseases of the circulatory system: Secondary | ICD-10-CM

## 2016-05-11 DIAGNOSIS — Z79899 Other long term (current) drug therapy: Secondary | ICD-10-CM

## 2016-05-11 DIAGNOSIS — E559 Vitamin D deficiency, unspecified: Secondary | ICD-10-CM

## 2016-05-11 DIAGNOSIS — Z87442 Personal history of urinary calculi: Secondary | ICD-10-CM

## 2016-05-11 DIAGNOSIS — M170 Bilateral primary osteoarthritis of knee: Secondary | ICD-10-CM

## 2016-05-11 LAB — CBC WITH DIFFERENTIAL/PLATELET
BASOS PCT: 0 %
Basophils Absolute: 0 cells/uL (ref 0–200)
EOS PCT: 5 %
Eosinophils Absolute: 355 cells/uL (ref 15–500)
HCT: 45.8 % — ABNORMAL HIGH (ref 35.0–45.0)
Hemoglobin: 14.2 g/dL (ref 11.7–15.5)
Lymphocytes Relative: 26 %
Lymphs Abs: 1846 cells/uL (ref 850–3900)
MCH: 28.5 pg (ref 27.0–33.0)
MCHC: 31 g/dL — ABNORMAL LOW (ref 32.0–36.0)
MCV: 91.8 fL (ref 80.0–100.0)
MONOS PCT: 7 %
MPV: 10.5 fL (ref 7.5–12.5)
Monocytes Absolute: 497 cells/uL (ref 200–950)
Neutro Abs: 4402 cells/uL (ref 1500–7800)
Neutrophils Relative %: 62 %
PLATELETS: 171 10*3/uL (ref 140–400)
RBC: 4.99 MIL/uL (ref 3.80–5.10)
RDW: 14.8 % (ref 11.0–15.0)
WBC: 7.1 10*3/uL (ref 3.8–10.8)

## 2016-05-11 MED ORDER — DICLOFENAC SODIUM 75 MG PO TBEC: 75.0000 mg | DELAYED_RELEASE_TABLET | Freq: Two times a day (BID) | ORAL | 5 refills | Status: AC | PRN

## 2016-05-12 LAB — COMPLETE METABOLIC PANEL WITH GFR
ALT: 19 U/L (ref 6–29)
AST: 17 U/L (ref 10–35)
Albumin: 4.2 g/dL (ref 3.6–5.1)
Alkaline Phosphatase: 53 U/L (ref 33–130)
BUN: 19 mg/dL (ref 7–25)
CALCIUM: 9.6 mg/dL (ref 8.6–10.4)
CHLORIDE: 104 mmol/L (ref 98–110)
CO2: 29 mmol/L (ref 20–31)
Creat: 1.16 mg/dL — ABNORMAL HIGH (ref 0.50–0.99)
GFR, Est African American: 59 mL/min — ABNORMAL LOW (ref >=60)
GFR, Est Non African American: 51 mL/min — ABNORMAL LOW (ref >=60)
GLUCOSE: 102 mg/dL — AB (ref 65–99)
POTASSIUM: 4.5 mmol/L (ref 3.5–5.3)
SODIUM: 143 mmol/L (ref 135–146)
Total Bilirubin: 0.4 mg/dL (ref 0.2–1.2)
Total Protein: 6.6 g/dL (ref 6.1–8.1)

## 2016-05-12 NOTE — Progress Notes (Signed)
#  1: CMP with GFR is normal except for mild elevation of creatinine. And GFR is slightly low at 51.Encourage patient to drink adequate amount of water.#2: CBC with differential is within normal limits.#3: [place] labs to PCP.#4: No change in treatment at this time.

## 2016-08-03 ENCOUNTER — Encounter: Payer: Self-pay | Admitting: Gastroenterology

## 2016-10-15 ENCOUNTER — Ambulatory Visit (AMBULATORY_SURGERY_CENTER): Payer: Self-pay | Admitting: *Deleted

## 2016-10-15 VITALS — Ht 64.0 in | Wt 177.8 lb

## 2016-10-15 DIAGNOSIS — Z1211 Encounter for screening for malignant neoplasm of colon: Secondary | ICD-10-CM

## 2016-10-15 MED ORDER — NA SULFATE-K SULFATE-MG SULF 17.5-3.13-1.6 GM/177ML PO SOLN: 1.0000 | Freq: Once | ORAL | 0 refills | Status: AC

## 2016-10-15 NOTE — Progress Notes (Signed)
No egg or soy allergy known to patient   issues with past sedation with any surgeries  or procedures of PONV,  no past  intubation No diet pills per patient No home 02 use per patient  No blood thinners per patient  Pt denies issues with constipation  No A fib or A flutter  EMMI video declined 15$ coupon to pt for suprep

## 2016-10-18 ENCOUNTER — Encounter: Payer: Self-pay | Admitting: Gastroenterology

## 2016-10-29 ENCOUNTER — Ambulatory Visit (AMBULATORY_SURGERY_CENTER): Payer: BC Managed Care – PPO | Admitting: Gastroenterology

## 2016-10-29 ENCOUNTER — Encounter: Payer: Self-pay | Admitting: Gastroenterology

## 2016-10-29 VITALS — BP 106/61 | HR 55 | Temp 98.9°F | Resp 9 | Ht 64.0 in | Wt 177.0 lb

## 2016-10-29 DIAGNOSIS — Z1212 Encounter for screening for malignant neoplasm of rectum: Secondary | ICD-10-CM

## 2016-10-29 DIAGNOSIS — Z1211 Encounter for screening for malignant neoplasm of colon: Secondary | ICD-10-CM | POA: Diagnosis not present

## 2016-10-29 MED ORDER — SODIUM CHLORIDE 0.9 % IV SOLN: 500.0000 mL | INTRAVENOUS | Status: DC

## 2016-10-29 NOTE — Patient Instructions (Signed)
YOU HAD AN ENDOSCOPIC PROCEDURE TODAY AT THE [facility]:   Refer to the procedure report that was given to you for any specific questions about what was found during the examination.  If the procedure report does not answer your questions, please call your gastroenterologist to clarify.  If you requested that your care partner not be given the details of your procedure findings, then the procedure report has been included in a sealed envelope for you to review at your convenience later.  YOU SHOULD EXPECT: Some feelings of bloating in the abdomen. Passage of more gas than usual.  Walking can help get rid of the air that was put into your GI tract during the procedure and reduce the bloating. If you had a lower endoscopy (such as a colonoscopy or flexible sigmoidoscopy) you may notice spotting of blood in your stool or on the toilet paper. If you underwent a bowel prep for your procedure, you may not have a normal bowel movement for a few days.  Please Note:  You might notice some irritation and congestion in your nose or some drainage.  This is from the oxygen used during your procedure.  There is no need for concern and it should clear up in a day or so.  SYMPTOMS TO REPORT IMMEDIATELY:   Following lower endoscopy (colonoscopy or flexible sigmoidoscopy):  Excessive amounts of blood in the stool  Significant tenderness or worsening of abdominal pains  Swelling of the abdomen that is new, acute  Fever of 100F or higher   For urgent or emergent issues, a gastroenterologist can be reached at any hour by calling [phone].   DIET:  We do recommend a small meal at first, but then you may proceed to your regular diet.  Drink plenty of fluids but you should avoid alcoholic beverages for 24 hours. Try to increase the fiber in your diet, and drink plenty of water.  ACTIVITY:  You should plan to take it easy for the rest of today and you should NOT DRIVE or use heavy machinery until  tomorrow (because of the sedation medicines used during the test).    FOLLOW UP: Our staff will call the number listed on your records the next business day following your procedure to check on you and address any questions or concerns that you may have regarding the information given to you following your procedure. If we do not reach you, we will leave a message.  However, if you are feeling well and you are not experiencing any problems, there is no need to return our call.  We will assume that you have returned to your regular daily activities without incident.  If any biopsies were taken you will be contacted by phone or by letter within the next 1-3 weeks.  Please call us at [phone] if you have not heard about the biopsies in 3 weeks.    SIGNATURES/CONFIDENTIALITY: You and/or your care partner have signed paperwork which will be entered into your electronic medical record.  These signatures attest to the fact that that the information above on your After Visit Summary has been reviewed and is understood.  Full responsibility of the confidentiality of this discharge information lies with you and/or your care-partner.  Read all of the handouts given to you by your recovery room nurse. 

## 2016-10-29 NOTE — Op Note (Signed)
[place] Patient Name: Jo Garcia Procedure Date: 10/29/2016 10:45 AM MRN: [id number] Endoscopist: Mauri Pole , MD Age: 61 Referring MD:  Date of Birth: Apr 10, 1956 Gender: Female Account #: [id number] Procedure:                Colonoscopy Indications:              Screening for colorectal malignant neoplasm Medicines:                Monitored Anesthesia Care Procedure:                Pre-Anesthesia Assessment:                           - Prior to the procedure, a History and Physical                            was performed, and patient medications and                            allergies were reviewed. The patient's tolerance of                            previous anesthesia was also reviewed. The risks                            and benefits of the procedure and the sedation                            options and risks were discussed with the patient.                            All questions were answered, and informed consent                            was obtained. Prior Anticoagulants: The patient has                            taken no previous anticoagulant or antiplatelet                            agents. ASA Grade Assessment: II - A patient with                            mild systemic disease. After reviewing the risks                            and benefits, the patient was deemed in                            satisfactory condition to undergo the procedure.                           After obtaining informed consent, the colonoscope  was passed under direct vision. Throughout the                            procedure, the patient's blood pressure, pulse, and                            oxygen saturations were monitored continuously. The                            Model PCF-H190DL [id number] [id number]) scope was introduced                            through the anus and advanced to the the cecum,   identified by appendiceal orifice and ileocecal                            valve. The colonoscopy was performed without                            difficulty. The patient tolerated the procedure                            well. The quality of the bowel preparation was                            excellent. The ileocecal valve, appendiceal                            orifice, and rectum were photographed. Scope In: 10:59:12 AM Scope Out: 11:13:22 AM Scope Withdrawal Time: 0 hours 10 minutes 6 seconds  Total Procedure Duration: 0 hours 14 minutes 10 seconds  Findings:                 The perianal and digital rectal examinations were                            normal.                           A few small and large-mouthed diverticula were                            found in the sigmoid colon.                           Non-bleeding internal hemorrhoids were found during                            retroflexion. The hemorrhoids were small.                           The exam was otherwise without abnormality. Complications:            No immediate complications. Estimated Blood Loss:     Estimated blood loss: none. Impression:               - Diverticulosis  in the sigmoid colon.                           - Non-bleeding internal hemorrhoids.                           - The examination was otherwise normal.                           - No specimens collected. Recommendation:           - Patient has a contact number available for                            emergencies. The signs and symptoms of potential                            delayed complications were discussed with the                            patient. Return to normal activities tomorrow.                            Written discharge instructions were provided to the                            patient.                           - Resume previous diet.                           - Continue present medications.                           -  Repeat colonoscopy in 10 years for screening                            purposes. Mauri Pole, MD 10/29/2016 11:17:07 AM This report has been signed electronically.

## 2016-10-29 NOTE — Progress Notes (Signed)
To recovery, report to Hodges, RN, VSS 

## 2016-11-01 ENCOUNTER — Telehealth: Payer: Self-pay | Admitting: *Deleted

## 2016-11-01 NOTE — Telephone Encounter (Signed)
  Follow up Call-  Call back number 10/29/2016  Post procedure Call Back phone  # [phone]  Permission to leave phone message Yes  Some recent data might be hidden     Patient questions:  Do you have a fever, pain , or abdominal swelling? No. Pain Score  0 *  Have you tolerated food without any problems? Yes.    Have you been able to return to your normal activities? Yes.    Do you have any questions about your discharge instructions: Diet   No. Medications  No. Follow up visit  No.  Do you have questions or concerns about your Care? No.  Actions: * If pain score is 4 or above: No action needed, pain <4.

## 2016-11-04 DIAGNOSIS — E785 Hyperlipidemia, unspecified: Secondary | ICD-10-CM | POA: Insufficient documentation

## 2016-11-04 NOTE — Progress Notes (Signed)
Office Visit Note  Patient: Jo Garcia             Date of Birth: 1955/10/10           MRN: [id number]             PCP: Haywood Pao, MD Referring: Haywood Pao, MD Visit Date: 11/08/2016 Occupation: @GUAROCC @    Subjective:  Pain hands   History of Present Illness: Jo Garcia is a 61 y.o. female with history of osteoarthritis and gout. She states she has not had a gout flare in a long time. She states she has twinges of discomfort sometimes in her toes and in her hands. She's been also having discomfort in her right third PIP joint. She states her right knee joint causes discomfort especially coming in and out of the car. She had Visco supplement injections last year which were helpful. Patient reports that she had bone density done by her PCP which might have shown osteopenia.  Activities of Daily Living:  Patient reports morning stiffness for 30 minutes.   Patient Denies nocturnal pain.  Difficulty dressing/grooming: Denies Difficulty climbing stairs: Reports Difficulty getting out of chair: Reports Difficulty using hands for taps, buttons, cutlery, and/or writing: Reports   Review of Systems  Constitutional: Positive for fatigue. Negative for night sweats, weight gain, weight loss and weakness.  HENT: Negative for mouth sores, trouble swallowing, trouble swallowing, mouth dryness and nose dryness.   Eyes: Negative for pain, redness, visual disturbance and dryness.  Respiratory: Negative for cough, shortness of breath and difficulty breathing.   Cardiovascular: Negative for chest pain, palpitations, hypertension, irregular heartbeat and swelling in legs/feet.  Gastrointestinal: Negative for blood in stool, constipation and diarrhea.  Endocrine: Negative for increased urination.  Genitourinary: Negative for vaginal dryness.  Musculoskeletal: Positive for arthralgias, joint pain and morning stiffness. Negative for joint swelling, myalgias, muscle  weakness, muscle tenderness and myalgias.  Skin: Positive for sensitivity to sunlight. Negative for color change, rash, hair loss, skin tightness and ulcers.  Allergic/Immunologic: Negative for susceptible to infections.  Neurological: Negative for dizziness, memory loss and night sweats.  Hematological: Negative for swollen glands.  Psychiatric/Behavioral: Negative for depressed mood and sleep disturbance. The patient is not nervous/anxious.     PMFS History:  Patient Active Problem List   Diagnosis Date Noted  . Dyslipidemia 11/04/2016  . Osteoarthritis of foot 04/30/2016  . History of hypertension 04/30/2016  . History of renal calculi 04/30/2016  . Vitamin D deficiency 04/30/2016  . History of hypothyroidism 04/30/2016  . Primary osteoarthritis of both hands 04/30/2016  . Primary osteoarthritis of both knees 04/30/2016  . Idiopathic chronic gout of multiple sites without tophus 04/30/2016    Past Medical History:  Diagnosis Date  . Anemia   . Chronic kidney disease    uric acid kidney stones  . GERD (gastroesophageal reflux disease)    past hx - uses ranitadine OTC prn only   . Gout   . Hyperlipidemia   . Hypertension   . Neuromuscular disorder ([place])    hiatal hernia  . Osteoarthritis    thumbs, big toe left foot, top both feet  . PONV (postoperative nausea and vomiting)   . Thyroid disease     Family History  Problem Relation Age of Onset  . Heart attack Father   . Colon cancer Cousin   . Colon polyps Neg Hx   . Esophageal cancer Neg Hx   . Rectal cancer Neg Hx   .  Stomach cancer Neg Hx    Past Surgical History:  Procedure Laterality Date  . ABLATION    . COLONOSCOPY    . DILATION AND CURETTAGE OF UTERUS    . SHOULDER SURGERY     repair of tear    Social History   Social History Narrative  . No narrative on file     Objective: Vital Signs: BP 101/64   Pulse (!) 54   Resp 14   Ht 5\' 5"  (1.651 m)   Wt 180 lb (81.6 kg)   BMI 29.95 kg/m     Physical Exam  Constitutional: She is oriented to person, place, and time. She appears well-developed and well-nourished.  HENT:  Head: Normocephalic and atraumatic.  Eyes: Conjunctivae and EOM are normal.  Neck: Normal range of motion.  Cardiovascular: Normal rate, regular rhythm, normal heart sounds and intact distal pulses.   Pulmonary/Chest: Effort normal and breath sounds normal.  Abdominal: Soft. Bowel sounds are normal.  Lymphadenopathy:    She has no cervical adenopathy.  Neurological: She is alert and oriented to person, place, and time.  Skin: Skin is warm and dry. Capillary refill takes less than 2 seconds.  Psychiatric: She has a normal mood and affect. Her behavior is normal.  Nursing note and vitals reviewed.    Musculoskeletal Exam: C-spine and thoracic lumbar spine good range of motion. Shoulder joints elbow joints wrist joint MCPs PIPs DIPs with good range of motion. She does have DIP PIP thickening bilaterally consistent with osteoarthritis. She also has thickening of her right third finger flexor tendon consistent with tenosynovitis. Hip joints, knee joints, ankles MTPs PIPs DIPs are good range of motion with no synovitis.  CDAI Exam: No CDAI exam completed.    Investigation: No additional findings.   Imaging: Xr Knee 3 View Right  Result Date: 11/08/2016 Moderate medial compartment narrowing noted. Severe patellofemoral narrowing noted. Impression: These findings a consistent with moderate osteoarthritis and severe chondromalacia patella.   Speciality Comments: No specialty comments available. CBC    Component Value Date/Time   WBC 7.1 05/11/2016 0843   RBC 4.99 05/11/2016 0843   HGB 14.2 05/11/2016 0843   HCT 45.8 (H) 05/11/2016 0843   PLT 171 05/11/2016 0843   MCV 91.8 05/11/2016 0843   MCH 28.5 05/11/2016 0843   MCHC 31.0 (L) 05/11/2016 0843   RDW 14.8 05/11/2016 0843   LYMPHSABS 1,846 05/11/2016 0843   MONOABS 497 05/11/2016 0843   EOSABS 355  05/11/2016 0843   BASOSABS 0 05/11/2016 0843   CMP     Component Value Date/Time   NA 143 05/11/2016 0843   K 4.5 05/11/2016 0843   CL 104 05/11/2016 0843   CO2 29 05/11/2016 0843   GLUCOSE 102 (H) 05/11/2016 0843   BUN 19 05/11/2016 0843   CREATININE 1.16 (H) 05/11/2016 0843   CALCIUM 9.6 05/11/2016 0843   PROT 6.6 05/11/2016 0843   ALBUMIN 4.2 05/11/2016 0843   AST 17 05/11/2016 0843   ALT 19 05/11/2016 0843   ALKPHOS 53 05/11/2016 0843   BILITOT 0.4 05/11/2016 0843   GFRNONAA 51 (L) 05/11/2016 0843   GFRAA 59 (L) 05/11/2016 0843   10/2014 Uric acid 4.0, GFR in 50s   Procedures:  No procedures performed Allergies: Penicillins; Sulfa antibiotics; and Codeine   Assessment / Plan:     Visit Diagnoses: Idiopathic chronic gout of multiple sites without tophus -Patient has not had any flares of gout. On allopurinol 300 mg by mouth daily, diclofenac  75 mg by mouth twice a day prn. - Plan: Uric acid  Trigger middle finger of right hand: I discussed cortisone injection which she declined. I've advised her to apply Voltaren gel and also we'll give finger splint to use for 3 days. Then she can uses intermittently for flares. If she has persistent problems she should notify us for future cortisone injection.  Primary osteoarthritis of both hands: Joint protection and muscle strengthening discussed.   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 Findings are consistent with moderate osteoarthritis and severe chondromalacia patella. Patient is concerned about possible meniscal tear as she is having some instability off-and-on. I've advised her to follow-up with Dr. Maureen Ralphs if she has persistent symptoms. In the meantime she will try Voltaren gel and knee joint extremity exercises.  Primary osteoarthritis of both feet: Proper fitting shoes were discussed.  History of hypertension: Blood pressure is  well controlled.  History of hypothyroidism  History of renal calculi uric acid: Followed by urology.  Vitamin D deficiency. Patient states that she has osteopenia. Have advised her to bring DEXA scan results next visit.  Dyslipidemia  NSAID long-term use - Plan: CBC with Differential/Platelet, COMPLETE METABOLIC PANEL WITH GFR     Orders: Orders Placed This Encounter  Procedures  . XR KNEE 3 VIEW RIGHT  . CBC with Differential/Platelet  . COMPLETE METABOLIC PANEL WITH GFR  . Uric acid  . VITAMIN D 25 Hydroxy (Vit-D Deficiency, Fractures)   No orders of the defined types were placed in this encounter.   Face-to-face time spent with patient was 30 minutes. 50% of time was spent in counseling and coordination of care.  Follow-Up Instructions: Return in about 6 months (around 05/11/2017) for Osteoarthritis, Gout.   Bo Merino, MD  Note - This record has been created using Editor, commissioning.  Chart creation errors have been sought, but may not always  have been located. Such creation errors do not reflect on  the standard of medical care.

## 2016-11-08 ENCOUNTER — Encounter: Payer: Self-pay | Admitting: Rheumatology

## 2016-11-08 ENCOUNTER — Ambulatory Visit (INDEPENDENT_AMBULATORY_CARE_PROVIDER_SITE_OTHER): Payer: BC Managed Care – PPO | Admitting: Rheumatology

## 2016-11-08 ENCOUNTER — Ambulatory Visit (INDEPENDENT_AMBULATORY_CARE_PROVIDER_SITE_OTHER): Payer: BC Managed Care – PPO

## 2016-11-08 VITALS — BP 101/64 | HR 54 | Resp 14 | Ht 65.0 in | Wt 180.0 lb

## 2016-11-08 DIAGNOSIS — M65331 Trigger finger, right middle finger: Secondary | ICD-10-CM | POA: Diagnosis not present

## 2016-11-08 DIAGNOSIS — M1A09X0 Idiopathic chronic gout, multiple sites, without tophus (tophi): Secondary | ICD-10-CM

## 2016-11-08 DIAGNOSIS — Z87442 Personal history of urinary calculi: Secondary | ICD-10-CM

## 2016-11-08 DIAGNOSIS — Z8679 Personal history of other diseases of the circulatory system: Secondary | ICD-10-CM | POA: Diagnosis not present

## 2016-11-08 DIAGNOSIS — M25561 Pain in right knee: Secondary | ICD-10-CM

## 2016-11-08 DIAGNOSIS — M19071 Primary osteoarthritis, right ankle and foot: Secondary | ICD-10-CM | POA: Diagnosis not present

## 2016-11-08 DIAGNOSIS — M19041 Primary osteoarthritis, right hand: Secondary | ICD-10-CM | POA: Diagnosis not present

## 2016-11-08 DIAGNOSIS — M170 Bilateral primary osteoarthritis of knee: Secondary | ICD-10-CM | POA: Diagnosis not present

## 2016-11-08 DIAGNOSIS — Z8639 Personal history of other endocrine, nutritional and metabolic disease: Secondary | ICD-10-CM

## 2016-11-08 DIAGNOSIS — E785 Hyperlipidemia, unspecified: Secondary | ICD-10-CM

## 2016-11-08 DIAGNOSIS — M19042 Primary osteoarthritis, left hand: Secondary | ICD-10-CM

## 2016-11-08 DIAGNOSIS — Z791 Long term (current) use of non-steroidal anti-inflammatories (NSAID): Secondary | ICD-10-CM

## 2016-11-08 DIAGNOSIS — G8929 Other chronic pain: Secondary | ICD-10-CM

## 2016-11-08 DIAGNOSIS — E559 Vitamin D deficiency, unspecified: Secondary | ICD-10-CM

## 2016-11-08 DIAGNOSIS — M19072 Primary osteoarthritis, left ankle and foot: Secondary | ICD-10-CM

## 2016-11-08 LAB — CBC WITH DIFFERENTIAL/PLATELET
BASOS ABS: 0 {cells}/uL (ref 0–200)
Basophils Relative: 0 %
EOS ABS: 427 {cells}/uL (ref 15–500)
EOS PCT: 7 %
HEMATOCRIT: 43.7 % (ref 35.0–45.0)
Hemoglobin: 14.2 g/dL (ref 11.7–15.5)
LYMPHS ABS: 1586 {cells}/uL (ref 850–3900)
Lymphocytes Relative: 26 %
MCH: 29.5 pg (ref 27.0–33.0)
MCHC: 32.5 g/dL (ref 32.0–36.0)
MCV: 90.7 fL (ref 80.0–100.0)
MONO ABS: 305 {cells}/uL (ref 200–950)
MPV: 10.2 fL (ref 7.5–12.5)
Monocytes Relative: 5 %
NEUTROS PCT: 62 %
Neutro Abs: 3782 cells/uL (ref 1500–7800)
Platelets: 157 10*3/uL (ref 140–400)
RBC: 4.82 MIL/uL (ref 3.80–5.10)
RDW: 15 % (ref 11.0–15.0)
WBC: 6.1 10*3/uL (ref 3.8–10.8)

## 2016-11-08 LAB — URIC ACID: URIC ACID, SERUM: 4.5 mg/dL (ref 2.5–7.0)

## 2016-11-08 LAB — COMPLETE METABOLIC PANEL WITH GFR
ALK PHOS: 63 U/L (ref 33–130)
ALT: 36 U/L — ABNORMAL HIGH (ref 6–29)
AST: 28 U/L (ref 10–35)
Albumin: 4.3 g/dL (ref 3.6–5.1)
BILIRUBIN TOTAL: 0.4 mg/dL (ref 0.2–1.2)
BUN: 17 mg/dL (ref 7–25)
CALCIUM: 9.5 mg/dL (ref 8.6–10.4)
CO2: 27 mmol/L (ref 20–31)
Chloride: 103 mmol/L (ref 98–110)
Creat: 1.1 mg/dL — ABNORMAL HIGH (ref 0.50–0.99)
GFR, EST AFRICAN AMERICAN: 63 mL/min (ref >=60)
GFR, EST NON AFRICAN AMERICAN: 55 mL/min — AB (ref >=60)
Glucose, Bld: 96 mg/dL (ref 65–99)
POTASSIUM: 4.2 mmol/L (ref 3.5–5.3)
Sodium: 141 mmol/L (ref 135–146)
TOTAL PROTEIN: 6.9 g/dL (ref 6.1–8.1)

## 2016-11-08 NOTE — Patient Instructions (Signed)

## 2016-11-09 LAB — VITAMIN D 25 HYDROXY (VIT D DEFICIENCY, FRACTURES): VIT D 25 HYDROXY: 25 ng/mL — AB (ref 30–100)

## 2016-11-09 NOTE — Progress Notes (Signed)
Labs are stable. Vitami D is low. Please call vitamin D 50,000 units once a week for 90 days. Repeat labs in 3 months.

## 2016-11-16 ENCOUNTER — Telehealth: Payer: Self-pay | Admitting: *Deleted

## 2016-11-16 DIAGNOSIS — E559 Vitamin D deficiency, unspecified: Secondary | ICD-10-CM

## 2016-11-16 MED ORDER — VITAMIN D (ERGOCALCIFEROL) 1.25 MG (50000 UNIT) PO CAPS: 50000.0000 [IU] | ORAL_CAPSULE | ORAL | 0 refills | Status: DC

## 2016-11-16 NOTE — Telephone Encounter (Signed)
-----   Message from Bo Merino, MD sent at 11/09/2016 12:24 PM EDT ----- Labs are stable. Vitami D is low. Please call vitamin D 50,000 units once a week for 90 days. Repeat labs in 3 months.

## 2017-02-07 ENCOUNTER — Telehealth: Payer: Self-pay | Admitting: Rheumatology

## 2017-02-07 ENCOUNTER — Other Ambulatory Visit: Payer: Self-pay | Admitting: *Deleted

## 2017-02-07 DIAGNOSIS — E559 Vitamin D deficiency, unspecified: Secondary | ICD-10-CM

## 2017-02-07 NOTE — Telephone Encounter (Signed)
Labs released.  

## 2017-02-07 NOTE — Telephone Encounter (Signed)
Patient needs lab orders sent to [facility] [phone]

## 2017-02-09 LAB — VITAMIN D 25 HYDROXY (VIT D DEFICIENCY, FRACTURES): VIT D 25 HYDROXY: 49 ng/mL (ref 30–100)

## 2017-02-09 NOTE — Progress Notes (Signed)
Vitamin D is normal now. She can take vitamin D 50,000 units once a month or 2000 units OTC every day.

## 2017-02-11 ENCOUNTER — Telehealth: Payer: Self-pay | Admitting: Rheumatology

## 2017-02-11 NOTE — Telephone Encounter (Signed)
Opened in error

## 2017-04-27 NOTE — Progress Notes (Signed)
Office Visit Note  Patient: Jo Garcia             Date of Birth: Nov 25, 1955           MRN: [id number]             PCP: Haywood Pao, MD Referring: Haywood Pao, MD Visit Date: 05/11/2017 Occupation: @GUAROCC @    Subjective:  Left CMC joint pain   History of Present Illness: Jo Garcia is a 61 y.o. female with a history of gout and osteoarthritis.  Patient states she continues to take Allopurinol 300 mg po daily.  She uses diclofenac 75 mg as needed during flares.  She states she noticed increased pain in her left elbow, wrist, and thumb while cooking around Christmas time.  She also is experiencing left CMC joint pain.  She states she has occasional knee and ankle pain.  She states she continues to see her urologist yearly, and she reports increased uric acid stones in her left kidney.  She states she experiences stiffness in her bilateral hands.  She also states she has right middle trigger finger, which she applies Voltaren gel that helps.     Activities of Daily Living:  Patient reports morning stiffness for 10 minutes.   Patient Denies nocturnal pain.  Difficulty dressing/grooming: Denies Difficulty climbing stairs: Reports Difficulty getting out of chair: Reports Difficulty using hands for taps, buttons, cutlery, and/or writing: Denies   Review of Systems  Constitutional: Negative for fatigue and weakness.  HENT: Negative for mouth sores, mouth dryness and nose dryness.   Eyes: Negative for redness and dryness.  Respiratory: Negative for cough, hemoptysis, shortness of breath and difficulty breathing.   Cardiovascular: Positive for hypertension. Negative for chest pain, palpitations, irregular heartbeat and swelling in legs/feet.  Gastrointestinal: Negative for blood in stool, constipation and diarrhea.  Endocrine: Negative for increased urination.  Genitourinary: Negative for painful urination.  Musculoskeletal: Positive for arthralgias, joint  pain, joint swelling and morning stiffness. Negative for myalgias, muscle weakness, muscle tenderness and myalgias.  Skin: Negative for pallor, rash, hair loss, nodules/bumps, redness, skin tightness, ulcers and sensitivity to sunlight.  Neurological: Negative for dizziness, numbness and headaches.  Hematological: Negative for swollen glands.  Psychiatric/Behavioral: Negative.  Negative for depressed mood and sleep disturbance. The patient is not nervous/anxious.     PMFS History:  Patient Active Problem List   Diagnosis Date Noted  . Dyslipidemia 11/04/2016  . Osteoarthritis of foot 04/30/2016  . History of hypertension 04/30/2016  . History of renal calculi 04/30/2016  . Vitamin D deficiency 04/30/2016  . History of hypothyroidism 04/30/2016  . Primary osteoarthritis of both hands 04/30/2016  . Primary osteoarthritis of both knees 04/30/2016  . Idiopathic chronic gout of multiple sites without tophus 04/30/2016    Past Medical History:  Diagnosis Date  . Anemia   . Chronic kidney disease    uric acid kidney stones  . GERD (gastroesophageal reflux disease)    past hx - uses ranitadine OTC prn only   . Gout   . Hyperlipidemia   . Hypertension   . Neuromuscular disorder ([place])    hiatal hernia  . Osteoarthritis    thumbs, big toe left foot, top both feet  . PONV (postoperative nausea and vomiting)   . Thyroid disease     Family History  Problem Relation Age of Onset  . Heart attack Father   . Colon cancer Cousin   . Colon polyps Neg Hx   .  Esophageal cancer Neg Hx   . Rectal cancer Neg Hx   . Stomach cancer Neg Hx    Past Surgical History:  Procedure Laterality Date  . ABLATION    . COLONOSCOPY    . DILATION AND CURETTAGE OF UTERUS    . SHOULDER SURGERY     repair of tear    Social History   Social History Narrative  . Not on file     Objective: Vital Signs: BP 122/64 (BP Location: Left Arm, Patient Position: Sitting, Cuff Size: Normal)   Pulse 73   Resp  17   Ht 5' 4.5" (1.638 m)   Wt 181 lb (82.1 kg)   BMI 30.59 kg/m    Physical Exam  Constitutional: She is oriented to person, place, and time. She appears well-developed and well-nourished.  HENT:  Head: Normocephalic and atraumatic.  Eyes: Conjunctivae and EOM are normal.  Neck: Normal range of motion.  Cardiovascular: Normal rate, regular rhythm, normal heart sounds and intact distal pulses.  Pulmonary/Chest: Effort normal and breath sounds normal.  Abdominal: Soft. Bowel sounds are normal.  Lymphadenopathy:    She has no cervical adenopathy.  Neurological: She is alert and oriented to person, place, and time.  Skin: Skin is warm and dry. Capillary refill takes less than 2 seconds.  Psychiatric: She has a normal mood and affect. Her behavior is normal.  Nursing note and vitals reviewed.    Musculoskeletal Exam: C-spine, thoracic, and lumbar spine good ROM.  Shoulder joints, elbow joints, and wrist joints good ROM.  MCPs, PIPs, and DIPs good ROM with no synovitis.  PIP and DIP synovial thickening consistent with osteoarthritis.  Left CMC joint tenderness.  Hip joints, knee joints, and ankle joints good ROM.  Knee crepitus bilaterally with no warmth or effusion.  MTPs, PIPs, and DIPs good ROM with no synovitis.    CDAI Exam: No CDAI exam completed.    Investigation: No additional findings. Uric acid: 11/08/2016 4.5 CBC Latest Ref Rng & Units 11/08/2016 05/11/2016  WBC 3.8 - 10.8 K/uL 6.1 7.1  Hemoglobin 11.7 - 15.5 g/dL 14.2 14.2  Hematocrit 35.0 - 45.0 % 43.7 45.8(H)  Platelets 140 - 400 K/uL 157 171   CMP Latest Ref Rng & Units 11/08/2016 05/11/2016  Glucose 65 - 99 mg/dL 96 102(H)  BUN 7 - 25 mg/dL 17 19  Creatinine 0.50 - 0.99 mg/dL 1.10(H) 1.16(H)  Sodium 135 - 146 mmol/L 141 143  Potassium 3.5 - 5.3 mmol/L 4.2 4.5  Chloride 98 - 110 mmol/L 103 104  CO2 20 - 31 mmol/L 27 29  Calcium 8.6 - 10.4 mg/dL 9.5 9.6  Total Protein 6.1 - 8.1 g/dL 6.9 6.6  Total Bilirubin 0.2 - 1.2  mg/dL 0.4 0.4  Alkaline Phos 33 - 130 U/L 63 53  AST 10 - 35 U/L 28 17  ALT 6 - 29 U/L 36(H) 19    Imaging: No results found.  Speciality Comments: No specialty comments available.    Procedures:  No procedures performed Allergies: Penicillins; Sulfa antibiotics; and Codeine   Assessment / Plan:     Visit Diagnoses: Idiopathic chronic gout of multiple sites without tophus - On allopurinol 300 mg by mouth daily, diclofenac 75 mg by mouth twice a day prn. Uric acid: 11/08/2016 4. Her uric acid level was drawn today.  She will continue on her current treatment regimen. Her joint pain is most likely due to osteoarthritis.- Plan: Uric acid  Primary osteoarthritis of both hands: She has  PIP and DIP synovial thickening. She has been experiencing pain in her CMC joints and DIP joints. Discussed joint protection and muscle strengthening.  A handout of hand exercises was provided to the patient.  A list of natural anti-inflammatories was also provided to the patient.    Primary osteoarthritis of both knees - with chondromalacia patella: Crepitus of bilateral knees.  No warmth or effusion.  She has occasional discomfort.  Discussed osteoarthritis natural anti-inflammatories that she can start introducing.    Primary osteoarthritis of both feet: She experiences occasional discomfort.  She wears proper fitting shoes.    History of vitamin D deficiency - Her vitamin D was last checked on 02/08/17 and it was WNL.  She is no longer taking Vitamin D 50,000 units.  Recommended that she take 2,000 units OTC daily as a maintenance dose.   NSAID long-term use - She takes Diclofenac PRN.  A CBC and CMP were drawn today.  CBC and CMP will be monitored every 6 months. Plan: CBC with Differential/Platelet, COMPLETE METABOLIC PANEL WITH GFR   Other medical conditions are listed as follows:   History of hypothyroidism  History of renal calculi - Followed by urology.  Patient reports she has increased uric acid  stones in her left kidney.  She is going to keep following up with her urologist.    History of hypertension  Dyslipidemia     Orders: Orders Placed This Encounter  Procedures  . Uric acid  . CBC with Differential/Platelet  . COMPLETE METABOLIC PANEL WITH GFR   No orders of the defined types were placed in this encounter.   Face-to-face time spent with patient was 30 minutes. Greater than 50% of time was spent in counseling and coordination of care.  Follow-Up Instructions: Return in about 6 months (around 11/08/2017) for Gout, Osteoarthritis.  Bo Merino, MD  Note - This record has been created using Editor, commissioning.  Chart creation errors have been sought, but may not always  have been located. Such creation errors do not reflect on  the standard of medical care.

## 2017-05-11 ENCOUNTER — Encounter: Payer: Self-pay | Admitting: Rheumatology

## 2017-05-11 ENCOUNTER — Ambulatory Visit: Payer: BC Managed Care – PPO | Admitting: Rheumatology

## 2017-05-11 ENCOUNTER — Encounter (INDEPENDENT_AMBULATORY_CARE_PROVIDER_SITE_OTHER): Payer: Self-pay

## 2017-05-11 VITALS — BP 122/64 | HR 73 | Resp 17 | Ht 64.5 in | Wt 181.0 lb

## 2017-05-11 DIAGNOSIS — Z8639 Personal history of other endocrine, nutritional and metabolic disease: Secondary | ICD-10-CM

## 2017-05-11 DIAGNOSIS — Z791 Long term (current) use of non-steroidal anti-inflammatories (NSAID): Secondary | ICD-10-CM

## 2017-05-11 DIAGNOSIS — M19072 Primary osteoarthritis, left ankle and foot: Secondary | ICD-10-CM | POA: Diagnosis not present

## 2017-05-11 DIAGNOSIS — M170 Bilateral primary osteoarthritis of knee: Secondary | ICD-10-CM

## 2017-05-11 DIAGNOSIS — M19071 Primary osteoarthritis, right ankle and foot: Secondary | ICD-10-CM | POA: Diagnosis not present

## 2017-05-11 DIAGNOSIS — Z8679 Personal history of other diseases of the circulatory system: Secondary | ICD-10-CM | POA: Diagnosis not present

## 2017-05-11 DIAGNOSIS — M1A09X0 Idiopathic chronic gout, multiple sites, without tophus (tophi): Secondary | ICD-10-CM | POA: Diagnosis not present

## 2017-05-11 DIAGNOSIS — Z87442 Personal history of urinary calculi: Secondary | ICD-10-CM

## 2017-05-11 DIAGNOSIS — M19041 Primary osteoarthritis, right hand: Secondary | ICD-10-CM | POA: Diagnosis not present

## 2017-05-11 DIAGNOSIS — M19042 Primary osteoarthritis, left hand: Secondary | ICD-10-CM | POA: Diagnosis not present

## 2017-05-11 DIAGNOSIS — E785 Hyperlipidemia, unspecified: Secondary | ICD-10-CM | POA: Diagnosis not present

## 2017-05-11 NOTE — Patient Instructions (Addendum)
 Natural anti-inflammatories  You can purchase these at [employer], [employer] or online.  . Turmeric (capsules)  . Ginger (ginger root or capsules)  . Omega 3 (Fish, flax seeds, chia seeds, walnuts, almonds)  . Tart cherry (dried or extract)   Patient should be under the care of a physician while taking these supplements. This may not be reproduced without the permission of Dr. Shaili Deveshwar.   Hand Exercises Hand exercises can be helpful to almost anyone. These exercises can strengthen the hands, improve flexibility and movement, and increase blood flow to the hands. These results can make work and daily tasks easier. Hand exercises can be especially helpful for people who have joint pain from arthritis or have nerve damage from overuse (carpal tunnel syndrome). These exercises can also help people who have injured a hand. Most of these hand exercises are fairly gentle stretching routines. You can do them often throughout the day. Still, it is a good idea to ask your health care provider which exercises would be best for you. Warming your hands before exercise may help to reduce stiffness. You can do this with gentle massage or by placing your hands in warm water for 15 minutes. Also, make sure you pay attention to your level of hand pain as you begin an exercise routine. Exercises Knuckle Bend Repeat this exercise 5-10 times with each hand. 1. Stand or sit with your arm, hand, and all five fingers pointed straight up. Make sure your wrist is straight. 2. Gently and slowly bend your fingers down and inward until the tips of your fingers are touching the tops of your palm. 3. Hold this position for a few seconds. 4. Extend your fingers out to their original position, all pointing straight up again.  Finger Fan Repeat this exercise 5-10 times with each hand. 1. Hold your arm and hand out in front of you. Keep your wrist straight. 2. Squeeze your hand into a fist. 3. Hold this  position for a few seconds. 4. Fan out, or spread apart, your hand and fingers as much as possible, stretching every joint fully.  Tabletop Repeat this exercise 5-10 times with each hand. 1. Stand or sit with your arm, hand, and all five fingers pointed straight up. Make sure your wrist is straight. 2. Gently and slowly bend your fingers at the knuckles where they meet the hand until your hand is making an upside-down L shape. Your fingers should form a tabletop. 3. Hold this position for a few seconds. 4. Extend your fingers out to their original position, all pointing straight up again.  Making Os Repeat this exercise 5-10 times with each hand. 1. Stand or sit with your arm, hand, and all five fingers pointed straight up. Make sure your wrist is straight. 2. Make an O shape by touching your pointer finger to your thumb. Hold for a few seconds. Then open your hand wide. 3. Repeat this motion with each finger on your hand.  Table Spread Repeat this exercise 5-10 times with each hand. 1. Place your hand on a table with your palm facing down. Make sure your wrist is straight. 2. Spread your fingers out as much as possible. Hold this position for a few seconds. 3. Slide your fingers back together again. Hold for a few seconds.  Ball Grip  Repeat this exercise 10-15 times with each hand. 1. Hold a tennis ball or another soft ball in your hand. 2. While slowly increasing pressure, squeeze the ball as   hard as possible. 3. Squeeze as hard as you can for 3-5 seconds. 4. Relax and repeat.  Wrist Curls Repeat this exercise 10-15 times with each hand. 1. Sit in a chair that has armrests. 2. Hold a light weight in your hand, such as a dumbbell that weighs 1-3 pounds (0.5-1.4 kg). Ask your health care provider what weight would be best for you. 3. Rest your hand just over the end of the chair arm with your palm facing up. 4. Gently pivot your wrist up and down while holding the weight. Do not  twist your wrist from side to side.  Contact a health care provider if:  Your hand pain or discomfort gets much worse when you do an exercise.  Your hand pain or discomfort does not improve within 2 hours after you exercise. If you have any of these problems, stop doing these exercises right away. Do not do them again unless your health care provider says that you can. Get help right away if:  You develop sudden, severe hand pain. If this happens, stop doing these exercises right away. Do not do them again unless your health care provider says that you can. This information is not intended to replace advice given to you by your health care provider. Make sure you discuss any questions you have with your health care provider. Document Released: 04/07/2015 Document Revised: 10/02/2015 Document Reviewed: 11/04/2014 Elsevier Interactive Patient Education  2018 [employer]  

## 2017-05-12 LAB — COMPLETE METABOLIC PANEL WITH GFR
AG Ratio: 1.9 (calc) (ref 1.0–2.5)
ALKALINE PHOSPHATASE (APISO): 71 U/L (ref 33–130)
ALT: 24 U/L (ref 6–29)
AST: 19 U/L (ref 10–35)
Albumin: 4.4 g/dL (ref 3.6–5.1)
BUN / CREAT RATIO: 17 (calc) (ref 6–22)
BUN: 23 mg/dL (ref 7–25)
CO2: 31 mmol/L (ref 20–32)
CREATININE: 1.37 mg/dL — AB (ref 0.50–0.99)
Calcium: 9.6 mg/dL (ref 8.6–10.4)
Chloride: 104 mmol/L (ref 98–110)
GFR, EST NON AFRICAN AMERICAN: 42 mL/min/{1.73_m2} — AB (ref >=60)
GFR, Est African American: 48 mL/min/{1.73_m2} — ABNORMAL LOW (ref >=60)
GLOBULIN: 2.3 g/dL (ref 1.9–3.7)
Glucose, Bld: 100 mg/dL — ABNORMAL HIGH (ref 65–99)
Potassium: 4.7 mmol/L (ref 3.5–5.3)
SODIUM: 141 mmol/L (ref 135–146)
Total Bilirubin: 0.5 mg/dL (ref 0.2–1.2)
Total Protein: 6.7 g/dL (ref 6.1–8.1)

## 2017-05-12 LAB — CBC WITH DIFFERENTIAL/PLATELET
BASOS PCT: 0.4 %
Basophils Absolute: 28 cells/uL (ref 0–200)
EOS ABS: 391 {cells}/uL (ref 15–500)
Eosinophils Relative: 5.5 %
HCT: 43 % (ref 35.0–45.0)
Hemoglobin: 14.1 g/dL (ref 11.7–15.5)
Lymphs Abs: 1512 cells/uL (ref 850–3900)
MCH: 29.1 pg (ref 27.0–33.0)
MCHC: 32.8 g/dL (ref 32.0–36.0)
MCV: 88.7 fL (ref 80.0–100.0)
MONOS PCT: 6.3 %
MPV: 10.5 fL (ref 7.5–12.5)
Neutro Abs: 4722 cells/uL (ref 1500–7800)
Neutrophils Relative %: 66.5 %
PLATELETS: 171 10*3/uL (ref 140–400)
RBC: 4.85 10*6/uL (ref 3.80–5.10)
RDW: 13.4 % (ref 11.0–15.0)
TOTAL LYMPHOCYTE: 21.3 %
WBC: 7.1 10*3/uL (ref 3.8–10.8)
WBCMIX: 447 {cells}/uL (ref 200–950)

## 2017-05-12 LAB — URIC ACID: Uric Acid, Serum: 3.9 mg/dL (ref 2.5–7.0)

## 2017-05-12 NOTE — Progress Notes (Signed)
Uric acid is within desired range.   Creat continues to increase and GFR continues to decrease.  Please advise her to discontinue Diclofenac po tablets.  We will continue to monitor.

## 2017-06-17 ENCOUNTER — Other Ambulatory Visit: Payer: Self-pay | Admitting: *Deleted

## 2017-06-17 NOTE — Telephone Encounter (Signed)
Refill request received from pharmacy for  Diclofenac. Unable to refill due to labs.

## 2017-11-03 NOTE — Progress Notes (Signed)
Office Visit Note  Patient: Jo Garcia             Date of Birth: 15-Jun-1955           MRN: [id number]             PCP: Haywood Pao, MD Referring: Haywood Pao, MD Visit Date: 11/16/2017 Occupation: @GUAROCC @    Subjective:  Hand stiffness   History of Present Illness: Jo Garcia is a 62 y.o. female with history of gout and osteoarthritis.  Patient is on allopurinol 300 mg by mouth daily and Diclofenac 75 mg BID PRN.  She denies any recent gout flares.  She denies missing any doses of her medications.  She occasionally will take Tylenol for pain relief as well.  She reports she continues to have stiffness in bilateral hands.  She denies any pain or swelling at this time.  She states she tries to work on range of motion and complete fist formation on a daily basis.  She denies any pain in her feet at this time.  She wears proper fitting shoes.  She states that in May she twisted her knee while trying to pull a box of paper on the ground.  She states that her right knee bruised and was very painful.  She denies any swelling or pain at this time.  She states she continues to have crepitus when climbing steps.    Activities of Daily Living:  Patient reports morning stiffness all day.   Patient Reports nocturnal pain.  Difficulty dressing/grooming: Denies Difficulty climbing stairs: Reports Difficulty getting out of chair: Reports Difficulty using hands for taps, buttons, cutlery, and/or writing: Reports   Review of Systems  Constitutional: Negative for fatigue.  HENT: Negative for mouth sores, mouth dryness and nose dryness.   Eyes: Negative for pain, visual disturbance and dryness.  Respiratory: Negative for cough, hemoptysis, shortness of breath and difficulty breathing.   Cardiovascular: Negative for chest pain, palpitations, hypertension and swelling in legs/feet.  Gastrointestinal: Negative for blood in stool, constipation and diarrhea.  Endocrine:  Negative for increased urination.  Genitourinary: Negative for painful urination.  Musculoskeletal: Negative for arthralgias, joint pain, joint swelling, myalgias, muscle weakness, morning stiffness, muscle tenderness and myalgias.  Skin: Negative for color change, pallor, rash, hair loss, nodules/bumps, skin tightness, ulcers and sensitivity to sunlight.  Allergic/Immunologic: Negative for susceptible to infections.  Neurological: Negative for dizziness, numbness, headaches and weakness.  Hematological: Negative for swollen glands.  Psychiatric/Behavioral: Negative for depressed mood and sleep disturbance. The patient is not nervous/anxious.     PMFS History:  Patient Active Problem List   Diagnosis Date Noted  . Dyslipidemia 11/04/2016  . Osteoarthritis of foot 04/30/2016  . History of hypertension 04/30/2016  . History of renal calculi 04/30/2016  . Vitamin D deficiency 04/30/2016  . History of hypothyroidism 04/30/2016  . Primary osteoarthritis of both hands 04/30/2016  . Primary osteoarthritis of both knees 04/30/2016  . Idiopathic chronic gout of multiple sites without tophus 04/30/2016    Past Medical History:  Diagnosis Date  . Anemia   . Chronic kidney disease    uric acid kidney stones  . GERD (gastroesophageal reflux disease)    past hx - uses ranitadine OTC prn only   . Gout   . Hyperlipidemia   . Hypertension   . Neuromuscular disorder ([place])    hiatal hernia  . Osteoarthritis    thumbs, big toe left foot, top both feet  . PONV (  postoperative nausea and vomiting)   . Thyroid disease     Family History  Problem Relation Age of Onset  . Rheum arthritis Mother   . Heart attack Father   . Colon cancer Cousin   . Rheum arthritis Daughter   . Hypertension Daughter   . Healthy Son   . Colon polyps Neg Hx   . Esophageal cancer Neg Hx   . Rectal cancer Neg Hx   . Stomach cancer Neg Hx    Past Surgical History:  Procedure Laterality Date  . ABLATION    .  COLONOSCOPY    . DILATION AND CURETTAGE OF UTERUS    . SHOULDER SURGERY     repair of tear    Social History   Social History Narrative  . Not on file     Objective: Vital Signs: BP 110/76 (BP Location: Left Arm, Patient Position: Sitting, Cuff Size: Normal)   Pulse 64   Resp 12   Ht 5' 4.5" (1.638 m)   Wt 181 lb (82.1 kg)   BMI 30.59 kg/m    Physical Exam  Constitutional: She is oriented to person, place, and time. She appears well-developed and well-nourished.  HENT:  Head: Normocephalic and atraumatic.  Eyes: Conjunctivae and EOM are normal.  Neck: Normal range of motion.  Cardiovascular: Normal rate, regular rhythm, normal heart sounds and intact distal pulses.  Pulmonary/Chest: Effort normal and breath sounds normal.  Abdominal: Soft. Bowel sounds are normal.  Lymphadenopathy:    She has no cervical adenopathy.  Neurological: She is alert and oriented to person, place, and time.  Skin: Skin is warm and dry. Capillary refill takes less than 2 seconds.  Psychiatric: She has a normal mood and affect. Her behavior is normal.  Nursing note and vitals reviewed.    Musculoskeletal Exam: C-spine, thoracic spine, and lumbar spine good ROM. No midline spinal tenderness.  No SI joint tenderness. Shoulder joints, elbow joints, wrist joints, MCPs, PIPs, and DIPs good ROM. Nearly complete fist formation bilaterally.  PIP and DIP synovial thickening consistent with osteoarthritis.  Mild CMC joint synovial thickening bilaterally.  Hip joints, knee joints, ankle joints, ankle joints, MTPs, PIPs, and DIPs good ROM with no synovitis.  Discomfort with right hip ROM.  No warmth or effusion of knee joints.  No tenderness of trochanteric bursa bilaterally.   CDAI Exam: No CDAI exam completed.    Investigation: No additional findings.Uric acid: 05/11/2017 3.9 CBC Latest Ref Rng & Units 05/11/2017 11/08/2016 05/11/2016  WBC 3.8 - 10.8 Thousand/uL 7.1 6.1 7.1  Hemoglobin 11.7 - 15.5 g/dL 14.1 14.2  14.2  Hematocrit 35.0 - 45.0 % 43.0 43.7 45.8(H)  Platelets 140 - 400 Thousand/uL 171 157 171   CMP Latest Ref Rng & Units 05/11/2017 11/08/2016 05/11/2016  Glucose 65 - 99 mg/dL 100(H) 96 102(H)  BUN 7 - 25 mg/dL 23 17 19   Creatinine 0.50 - 0.99 mg/dL 1.37(H) 1.10(H) 1.16(H)  Sodium 135 - 146 mmol/L 141 141 143  Potassium 3.5 - 5.3 mmol/L 4.7 4.2 4.5  Chloride 98 - 110 mmol/L 104 103 104  CO2 20 - 32 mmol/L 31 27 29   Calcium 8.6 - 10.4 mg/dL 9.6 9.5 9.6  Total Protein 6.1 - 8.1 g/dL 6.7 6.9 6.6  Total Bilirubin 0.2 - 1.2 mg/dL 0.5 0.4 0.4  Alkaline Phos 33 - 130 U/L - 63 53  AST 10 - 35 U/L 19 28 17   ALT 6 - 29 U/L 24 36(H) 19    Imaging:  No results found.  Speciality Comments: No specialty comments available.    Procedures:  No procedures performed Allergies: Penicillins; Sulfa antibiotics; and Codeine   Assessment / Plan:     Visit Diagnoses: Idiopathic chronic gout of multiple sites without tophus -She has not had any flares of gout recently.  She has no joint pain or joint swelling at this time.  She has no synovitis on exam.  She continues to take allopurinol 300 mg by mouth daily.  She takes Diclofenac 75 mg by mouth twice daily as needed.  We will check uric acid level, CBC, and CMP today .Uric acid: 05/11/2017 3.9 - Plan: CBC with Differential/Platelet, COMPLETE METABOLIC PANEL WITH GFR, Uric acid  Primary osteoarthritis of both hands: She has PIP and DIP synovial thickening consistent with osteoarthritis of bilateral hands.  She has mild CMC joint synovial thickening bilaterally.  She has nearly complete fist formation bilaterally.  She works on range of motion daily basis.  She is given a handout of hand exercises that she can perform at home.  Joint protection and muscle strengthening were discussed.  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  No warmth or effusion noted on exam.  She has good range of motion.  She  injured her right knee in May when she twisted her knee while moving a box of paper on the ground.  She reports that it became very swollen and bruised.  She continues to have some tenderness on the medial aspect of her right knee.  No ecchymosis is noted today.  Primary osteoarthritis of both feet: She has mild osteoarthritic changes in bilateral feet. She has no discomfort at this time.  The importance of wearing proper fitting shoes was discussed.  History of vitamin D deficiency  NSAID long-term use - She takes Diclofenac PRN.  We will check CMP today.  - Plan: COMPLETE METABOLIC PANEL WITH GFR  Other medical conditions are listed as follows:   Dyslipidemia  History of renal calculi - Followed by urology.  History of hypothyroidism  History of hypertension    Orders: Orders Placed This Encounter  Procedures  . CBC with Differential/Platelet  . COMPLETE METABOLIC PANEL WITH GFR  . Uric acid   No orders of the defined types were placed in this encounter.     Follow-Up Instructions: Return in about 6 months (around 05/19/2018) for Gout, Osteoarthritis.   Ofilia Neas, PA-C   I examined and evaluated the patient with Jo Sams PA.  Patient has significant osteoarthritis in her hands on my examination today.  She also has osteoarthritis in her knee joints which causes crepitus.  No synovitis was noted.  She has not had any gout flare in a long time.  The plan of care was discussed as noted above.  Bo Merino, MD  Note - This record has been created using Editor, commissioning.  Chart creation errors have been sought, but may not always  have been located. Such creation errors do not reflect on  the standard of medical care.

## 2017-11-16 ENCOUNTER — Ambulatory Visit: Payer: BC Managed Care – PPO | Admitting: Rheumatology

## 2017-11-16 ENCOUNTER — Encounter (INDEPENDENT_AMBULATORY_CARE_PROVIDER_SITE_OTHER): Payer: Self-pay

## 2017-11-16 ENCOUNTER — Encounter: Payer: Self-pay | Admitting: Rheumatology

## 2017-11-16 VITALS — BP 110/76 | HR 64 | Resp 12 | Ht 64.5 in | Wt 181.0 lb

## 2017-11-16 DIAGNOSIS — M19042 Primary osteoarthritis, left hand: Secondary | ICD-10-CM

## 2017-11-16 DIAGNOSIS — Z8639 Personal history of other endocrine, nutritional and metabolic disease: Secondary | ICD-10-CM | POA: Diagnosis not present

## 2017-11-16 DIAGNOSIS — M19072 Primary osteoarthritis, left ankle and foot: Secondary | ICD-10-CM

## 2017-11-16 DIAGNOSIS — M1A09X0 Idiopathic chronic gout, multiple sites, without tophus (tophi): Secondary | ICD-10-CM | POA: Diagnosis not present

## 2017-11-16 DIAGNOSIS — M170 Bilateral primary osteoarthritis of knee: Secondary | ICD-10-CM | POA: Diagnosis not present

## 2017-11-16 DIAGNOSIS — M19041 Primary osteoarthritis, right hand: Secondary | ICD-10-CM

## 2017-11-16 DIAGNOSIS — Z8679 Personal history of other diseases of the circulatory system: Secondary | ICD-10-CM | POA: Diagnosis not present

## 2017-11-16 DIAGNOSIS — M19071 Primary osteoarthritis, right ankle and foot: Secondary | ICD-10-CM | POA: Diagnosis not present

## 2017-11-16 DIAGNOSIS — Z87442 Personal history of urinary calculi: Secondary | ICD-10-CM | POA: Diagnosis not present

## 2017-11-16 DIAGNOSIS — Z791 Long term (current) use of non-steroidal anti-inflammatories (NSAID): Secondary | ICD-10-CM | POA: Diagnosis not present

## 2017-11-16 DIAGNOSIS — E785 Hyperlipidemia, unspecified: Secondary | ICD-10-CM

## 2017-11-16 NOTE — Patient Instructions (Signed)

## 2017-11-17 LAB — COMPLETE METABOLIC PANEL WITH GFR
AG Ratio: 1.8 (calc) (ref 1.0–2.5)
ALKALINE PHOSPHATASE (APISO): 65 U/L (ref 33–130)
ALT: 15 U/L (ref 6–29)
AST: 18 U/L (ref 10–35)
Albumin: 4.4 g/dL (ref 3.6–5.1)
BILIRUBIN TOTAL: 0.5 mg/dL (ref 0.2–1.2)
BUN/Creatinine Ratio: 16 (calc) (ref 6–22)
BUN: 19 mg/dL (ref 7–25)
CHLORIDE: 104 mmol/L (ref 98–110)
CO2: 31 mmol/L (ref 20–32)
CREATININE: 1.16 mg/dL — AB (ref 0.50–0.99)
Calcium: 9.2 mg/dL (ref 8.6–10.4)
GFR, Est African American: 59 mL/min/{1.73_m2} — ABNORMAL LOW (ref >=60)
GFR, Est Non African American: 51 mL/min/{1.73_m2} — ABNORMAL LOW (ref >=60)
GLUCOSE: 95 mg/dL (ref 65–99)
Globulin: 2.4 g/dL (calc) (ref 1.9–3.7)
Potassium: 4 mmol/L (ref 3.5–5.3)
Sodium: 142 mmol/L (ref 135–146)
TOTAL PROTEIN: 6.8 g/dL (ref 6.1–8.1)

## 2017-11-17 LAB — URIC ACID: URIC ACID, SERUM: 4 mg/dL (ref 2.5–7.0)

## 2017-11-17 LAB — CBC WITH DIFFERENTIAL/PLATELET
BASOS PCT: 0.5 %
Basophils Absolute: 33 cells/uL (ref 0–200)
EOS PCT: 7 %
Eosinophils Absolute: 455 cells/uL (ref 15–500)
HEMATOCRIT: 41.9 % (ref 35.0–45.0)
Hemoglobin: 14.1 g/dL (ref 11.7–15.5)
LYMPHS ABS: 1599 {cells}/uL (ref 850–3900)
MCH: 29.7 pg (ref 27.0–33.0)
MCHC: 33.7 g/dL (ref 32.0–36.0)
MCV: 88.4 fL (ref 80.0–100.0)
MPV: 10.7 fL (ref 7.5–12.5)
Monocytes Relative: 8.5 %
Neutro Abs: 3861 cells/uL (ref 1500–7800)
Neutrophils Relative %: 59.4 %
PLATELETS: 187 10*3/uL (ref 140–400)
RBC: 4.74 10*6/uL (ref 3.80–5.10)
RDW: 13.6 % (ref 11.0–15.0)
Total Lymphocyte: 24.6 %
WBC: 6.5 10*3/uL (ref 3.8–10.8)
WBCMIX: 553 {cells}/uL (ref 200–950)

## 2017-11-17 NOTE — Progress Notes (Signed)
CBC WNL. CMP stable. Uric acid is 4.0, which is in desirable range.

## 2017-11-21 ENCOUNTER — Ambulatory Visit: Payer: BC Managed Care – PPO | Admitting: Rheumatology

## 2017-12-08 HISTORY — PX: EYE SURGERY: SHX253

## 2018-05-04 NOTE — Progress Notes (Signed)
Office Visit Note  Patient: Jo Garcia             Date of Birth: 03-15-1956           MRN: [id number]             PCP: Haywood Pao, MD Referring: Haywood Pao, MD Visit Date: 05/18/2018 Occupation: @GUAROCC @  Subjective:  Left shoulder pain   History of Present Illness: Jo Garcia is a 62 y.o. female with history of gout and osteoarthritis.  She is on Allopurinol 300 mg po daily.  She has not had any recent gout flares.  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   She presents today with pain in multiple joints including bilateral shoulder joints, bilateral knee joints, bilateral hands, and bilateral feet.  She denies any joint swelling.  She states she walks 4 miles per day.  She states she experiences pain on the dorsal aspect of both feet and is unable to wear tennis shoes. She states she is having severe pain in the left shoulder joint.  She states she has been evaluated by Dr. Noemi Chapel in September 2019 who recommended performing exercises.  She states the exercises have worsened her left shoulder pain. She is also having pain in both CMC joints.   She has been taking Ibuprofen 200 mg 3 tablets by mouth at bedtime and she uses voltaren gel topically at bedtime.    Activities of Daily Living:  Patient reports morning stiffness for several hours.   Patient Reports nocturnal pain.  Difficulty dressing/grooming: Reports Difficulty climbing stairs: Denies Difficulty getting out of chair: Denies Difficulty using hands for taps, buttons, cutlery, and/or writing: Reports  Review of Systems  Constitutional: Positive for fatigue.  HENT: Negative for mouth sores, trouble swallowing, trouble swallowing, mouth dryness and nose dryness.   Eyes: Negative for pain, redness, itching, visual disturbance and dryness.  Respiratory: Negative for cough, hemoptysis, shortness of breath, wheezing and difficulty breathing.   Cardiovascular: Negative for  chest pain, palpitations, hypertension and swelling in legs/feet.  Gastrointestinal: Negative for abdominal pain, blood in stool, constipation and diarrhea.  Endocrine: Negative for increased urination.  Genitourinary: Negative for painful urination, nocturia and pelvic pain.  Musculoskeletal: Positive for arthralgias, joint pain, joint swelling and morning stiffness. Negative for myalgias, muscle weakness, muscle tenderness and myalgias.  Skin: Negative for color change, pallor, rash, hair loss, nodules/bumps, skin tightness, ulcers and sensitivity to sunlight.  Allergic/Immunologic: Negative for susceptible to infections.  Neurological: Negative for dizziness, light-headedness, numbness, headaches, memory loss and weakness.  Hematological: Negative for bruising/bleeding tendency and swollen glands.  Psychiatric/Behavioral: Negative for depressed mood, confusion and sleep disturbance. The patient is not nervous/anxious.     PMFS History:  Patient Active Problem List   Diagnosis Date Noted  . Dyslipidemia 11/04/2016  . Osteoarthritis of foot 04/30/2016  . History of hypertension 04/30/2016  . History of renal calculi 04/30/2016  . Vitamin D deficiency 04/30/2016  . History of hypothyroidism 04/30/2016  . Primary osteoarthritis of both hands 04/30/2016  . Primary osteoarthritis of both knees 04/30/2016  . Idiopathic chronic gout of multiple sites without tophus 04/30/2016    Past Medical History:  Diagnosis Date  . Anemia   . Chronic kidney disease    uric acid kidney stones  . GERD (gastroesophageal reflux disease)    past hx - uses ranitadine OTC prn only   . Gout   . Hyperlipidemia   . Hypertension   .  Neuromuscular disorder ([place])    hiatal hernia  . Osteoarthritis    thumbs, big toe left foot, top both feet  . PONV (postoperative nausea and vomiting)   . Thyroid disease     Family History  Problem Relation Age of Onset  . Rheum arthritis Mother   . Heart attack Father    . Colon cancer Cousin   . Rheum arthritis Daughter   . Hypertension Daughter   . Healthy Son   . Colon polyps Neg Hx   . Esophageal cancer Neg Hx   . Rectal cancer Neg Hx   . Stomach cancer Neg Hx    Past Surgical History:  Procedure Laterality Date  . ABLATION    . COLONOSCOPY    . DILATION AND CURETTAGE OF UTERUS    . EYE SURGERY Bilateral 12/2017   eyelid surgery   . SHOULDER SURGERY     repair of tear    Social History   Social History Narrative  . Not on file    Objective: Vital Signs: BP 124/72 (BP Location: Left Arm, Patient Position: Sitting, Cuff Size: Normal)   Pulse (!) 47   Resp 13   Ht 5' 4.5" (1.638 m)   Wt 174 lb (78.9 kg)   BMI 29.41 kg/m    Physical Exam Vitals signs and nursing note reviewed.  Constitutional:      Appearance: She is well-developed.  HENT:     Head: Normocephalic and atraumatic.  Eyes:     Conjunctiva/sclera: Conjunctivae normal.  Neck:     Musculoskeletal: Normal range of motion.  Cardiovascular:     Rate and Rhythm: Normal rate and regular rhythm.     Heart sounds: Normal heart sounds.  Pulmonary:     Effort: Pulmonary effort is normal.     Breath sounds: Normal breath sounds.  Abdominal:     General: Bowel sounds are normal.     Palpations: Abdomen is soft.  Lymphadenopathy:     Cervical: No cervical adenopathy.  Skin:    General: Skin is warm and dry.     Capillary Refill: Capillary refill takes less than 2 seconds.  Neurological:     Mental Status: She is alert and oriented to person, place, and time.  Psychiatric:        Behavior: Behavior normal.      Musculoskeletal Exam: C-spine, thoracic spine, and lumbar spine good ROM.  No midline spinal tenderness.  No SI joint tenderness.  Shoulder joints good ROM. She has painful ROM of the left shoulder joint.  Elbow joints, wrist joints, MCPs, PIPs, and DIPs good ROM with no synovitis. PIP and DIP synovial thickening consistent with osteoarthritis of both hands. Mild  CMC joint synovial thickening.  Right CMC joint tenderness. Right hip slightly limited ROM with discomfort. Full ROM of bilateral knee joints.  No warmth or effusion noted.  No tenderness of trochanteric bursa bilaterally.    CDAI Exam: CDAI Score: Not documented Patient Global Assessment: Not documented; Provider Global Assessment: Not documented Swollen: Not documented; Tender: Not documented Joint Exam   Not documented   There is currently no information documented on the homunculus. Go to the Rheumatology activity and complete the homunculus joint exam.  Investigation: No additional findings.  Imaging: Xr Shoulder Left  Result Date: 05/18/2018 No glenohumeral joint space narrowing was noted.  No acromioclavicular joint space narrowing was noted.  No chondrocalcinosis was noted. Impression: Unremarkable x-ray of the shoulder joint.   Recent Labs: Lab Results  Component Value Date   WBC 6.5 11/16/2017   HGB 14.1 11/16/2017   PLT 187 11/16/2017   NA 142 11/16/2017   K 4.0 11/16/2017   CL 104 11/16/2017   CO2 31 11/16/2017   GLUCOSE 95 11/16/2017   BUN 19 11/16/2017   CREATININE 1.16 (H) 11/16/2017   BILITOT 0.5 11/16/2017   ALKPHOS 63 11/08/2016   AST 18 11/16/2017   ALT 15 11/16/2017   PROT 6.8 11/16/2017   ALBUMIN 4.3 11/08/2016   CALCIUM 9.2 11/16/2017   GFRAA 59 (L) 11/16/2017    Speciality Comments: No specialty comments available.  Procedures:  Large Joint Inj: L glenohumeral on 05/18/2018 10:46 AM Indications: pain Details: 27 G 1.5 in needle, posterior approach  Arthrogram: No  Medications: 1.5 mL lidocaine 1 %; 40 mg triamcinolone acetonide 40 MG/ML Aspirate: 0 mL Outcome: tolerated well, no immediate complications Procedure, treatment alternatives, risks and benefits explained, specific risks discussed. Consent was given by the patient. Immediately prior to procedure a time out was called to verify the correct patient, procedure, equipment, support  staff and site/side marked as required. Patient was prepped and draped in the usual sterile fashion.     Allergies: Penicillins; Sulfa antibiotics; and Codeine   Assessment / Plan:     Visit Diagnoses: Idiopathic chronic gout of multiple sites without tophus - She has not had any recent gout flares.  She is clinically doing well on allopurinol 300 mg by mouth daily.  Her most recent uric acid: 11/16/2017 4.0.  Future orders for CBC, CMP, and uric acid were placed today.  She is going to return for lab work tomorrow.  She does not need any refills of allopurinol at this time.  She was advised to notify us if she develops a gout flare.  She will follow up in 6 months. - Plan: COMPLETE METABOLIC PANEL WITH GFR, CBC with Differential/Platelet, Uric acid  Primary osteoarthritis of both hands: She has PIP and DIP synovial thickening consistent with osteoarthritis of both hands.  She has mild CMC joint synovial thickening. She has right CMC joint tenderness.  No synovitis noted.  She has been experiencing increased discomfort in both hands.  Joint protection and muscle strengthening were discussed.   Chronic left shoulder pain -She has been having increased pain in the left shoulder for the past several months.  She was evaluated by Dr. Noemi Chapel who recommended exercises and performed a cortisone injection in September 2019.  She reports the cortisone injection provided significant relief.  She denies any recent injuries but reports she has been having increased pain with ROM and at night.  She requested a cortisone injection today.  She tolerated the procedure well.  Procedure note is completed above.  X-ray of the left knee was obtained today.  She reports performing shoulder exercises aggravates her left shoulder.  She is going to continue using voltaren gel topically for pain relief. 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  She has no warmth or effusion of knee joints.  She has good ROM bilaterally.  She walks 4 miles per day.  She was encouraged to continue on using voltaren gel topically.   NSAID long-term use - She takes Diclofenac PRN.  Future orders for CMP were placed today.   Primary osteoarthritis of both feet: She has tenderness on the dorsal aspect of both feet.  No synovitis or erythema  noted.  She has PIP and DIP synovial thickening consistent with osteoarthritis of both feet.  We discussed the importance of wearing proper fitting shoes.   Other medical conditions are listed as follows:   History of vitamin D deficiency  History of hypertension  History of renal calculi - Followed by urology.  Dyslipidemia  History of hypothyroidism    Orders: Orders Placed This Encounter  Procedures  . Large Joint Inj: L glenohumeral  . XR Shoulder Left  . COMPLETE METABOLIC PANEL WITH GFR  . CBC with Differential/Platelet  . Uric acid   No orders of the defined types were placed in this encounter.   Face-to-face time spent with patient was 30 minutes. Greater than 50% of time was spent in counseling and coordination of care.  Follow-Up Instructions: Return in about 6 months (around 11/16/2018) for Gout, Osteoarthritis.   Bo Merino, MD  Note - This record has been created using Editor, commissioning.  Chart creation errors have been sought, but may not always  have been located. Such creation errors do not reflect on  the standard of medical care.

## 2018-05-18 ENCOUNTER — Encounter: Payer: Self-pay | Admitting: Rheumatology

## 2018-05-18 ENCOUNTER — Ambulatory Visit (INDEPENDENT_AMBULATORY_CARE_PROVIDER_SITE_OTHER): Payer: Self-pay

## 2018-05-18 ENCOUNTER — Ambulatory Visit: Payer: BC Managed Care – PPO | Admitting: Rheumatology

## 2018-05-18 VITALS — BP 124/72 | HR 47 | Resp 13 | Ht 64.5 in | Wt 174.0 lb

## 2018-05-18 DIAGNOSIS — E785 Hyperlipidemia, unspecified: Secondary | ICD-10-CM

## 2018-05-18 DIAGNOSIS — M170 Bilateral primary osteoarthritis of knee: Secondary | ICD-10-CM

## 2018-05-18 DIAGNOSIS — M19042 Primary osteoarthritis, left hand: Secondary | ICD-10-CM

## 2018-05-18 DIAGNOSIS — M19071 Primary osteoarthritis, right ankle and foot: Secondary | ICD-10-CM | POA: Diagnosis not present

## 2018-05-18 DIAGNOSIS — M19072 Primary osteoarthritis, left ankle and foot: Secondary | ICD-10-CM

## 2018-05-18 DIAGNOSIS — Z87442 Personal history of urinary calculi: Secondary | ICD-10-CM

## 2018-05-18 DIAGNOSIS — Z8679 Personal history of other diseases of the circulatory system: Secondary | ICD-10-CM

## 2018-05-18 DIAGNOSIS — G8929 Other chronic pain: Secondary | ICD-10-CM

## 2018-05-18 DIAGNOSIS — Z8639 Personal history of other endocrine, nutritional and metabolic disease: Secondary | ICD-10-CM

## 2018-05-18 DIAGNOSIS — M25512 Pain in left shoulder: Secondary | ICD-10-CM | POA: Diagnosis not present

## 2018-05-18 DIAGNOSIS — M19041 Primary osteoarthritis, right hand: Secondary | ICD-10-CM | POA: Diagnosis not present

## 2018-05-18 DIAGNOSIS — M1A09X0 Idiopathic chronic gout, multiple sites, without tophus (tophi): Secondary | ICD-10-CM | POA: Diagnosis not present

## 2018-05-18 DIAGNOSIS — Z791 Long term (current) use of non-steroidal anti-inflammatories (NSAID): Secondary | ICD-10-CM

## 2018-05-18 MED ORDER — TRIAMCINOLONE ACETONIDE 40 MG/ML IJ SUSP
40.0000 mg | INTRAMUSCULAR | Status: AC | PRN
  Administered 2018-05-18: 40 mg via INTRA_ARTICULAR

## 2018-05-18 MED ORDER — LIDOCAINE HCL 1 % IJ SOLN
1.5000 mL | INTRAMUSCULAR | Status: AC | PRN
  Administered 2018-05-18: 1.5 mL

## 2018-05-23 ENCOUNTER — Other Ambulatory Visit: Payer: Self-pay | Admitting: *Deleted

## 2018-05-23 DIAGNOSIS — M1A09X0 Idiopathic chronic gout, multiple sites, without tophus (tophi): Secondary | ICD-10-CM

## 2018-05-24 LAB — CBC WITH DIFFERENTIAL/PLATELET
Absolute Monocytes: 578 cells/uL (ref 200–950)
Basophils Absolute: 38 cells/uL (ref 0–200)
Basophils Relative: 0.5 %
EOS PCT: 2.3 %
Eosinophils Absolute: 173 cells/uL (ref 15–500)
HCT: 43.7 % (ref 35.0–45.0)
Hemoglobin: 14.4 g/dL (ref 11.7–15.5)
Lymphs Abs: 1853 cells/uL (ref 850–3900)
MCH: 29.5 pg (ref 27.0–33.0)
MCHC: 33 g/dL (ref 32.0–36.0)
MCV: 89.5 fL (ref 80.0–100.0)
MONOS PCT: 7.7 %
MPV: 10.8 fL (ref 7.5–12.5)
NEUTROS ABS: 4860 {cells}/uL (ref 1500–7800)
Neutrophils Relative %: 64.8 %
Platelets: 190 10*3/uL (ref 140–400)
RBC: 4.88 10*6/uL (ref 3.80–5.10)
RDW: 13.8 % (ref 11.0–15.0)
Total Lymphocyte: 24.7 %
WBC: 7.5 10*3/uL (ref 3.8–10.8)

## 2018-05-24 LAB — COMPLETE METABOLIC PANEL WITH GFR
AG Ratio: 1.8 (calc) (ref 1.0–2.5)
ALT: 17 U/L (ref 6–29)
AST: 17 U/L (ref 10–35)
Albumin: 4.4 g/dL (ref 3.6–5.1)
Alkaline phosphatase (APISO): 62 U/L (ref 33–130)
BUN/Creatinine Ratio: 19 (calc) (ref 6–22)
BUN: 23 mg/dL (ref 7–25)
CO2: 28 mmol/L (ref 20–32)
Calcium: 9.4 mg/dL (ref 8.6–10.4)
Chloride: 102 mmol/L (ref 98–110)
Creat: 1.22 mg/dL — ABNORMAL HIGH (ref 0.50–0.99)
GFR, Est African American: 55 mL/min/{1.73_m2} — ABNORMAL LOW (ref >=60)
GFR, Est Non African American: 47 mL/min/{1.73_m2} — ABNORMAL LOW (ref >=60)
Globulin: 2.4 g/dL (calc) (ref 1.9–3.7)
Glucose, Bld: 89 mg/dL (ref 65–99)
Potassium: 4.4 mmol/L (ref 3.5–5.3)
Sodium: 141 mmol/L (ref 135–146)
Total Bilirubin: 0.5 mg/dL (ref 0.2–1.2)
Total Protein: 6.8 g/dL (ref 6.1–8.1)

## 2018-05-24 LAB — URIC ACID: Uric Acid, Serum: 4.4 mg/dL (ref 2.5–7.0)

## 2018-05-24 NOTE — Progress Notes (Signed)
Uric acid is within desirable range. CBC is WNL. Creatinine and GFR are stable. We will continue to monitor.

## 2018-11-02 NOTE — Progress Notes (Signed)
Office Visit Note  Patient: Jo Garcia             Date of Birth: 09/26/1955           MRN: [id number]             PCP: Jo Pao, MD Referring: Jo Pao, MD Visit Date: 11/16/2018 Occupation: @GUAROCC @  Subjective:  Pain in hands and feet   History of Present Illness: Jo Garcia is a 63 y.o. female with history of gout and osteoarthritis.  She is taking allopurinol 300 mg 1 tablet by mouth daily. She denies any gout flares.  She denies missing any doses of allopurinol.  She is experiencing increased pain in both hands and both feet.  She is having bilateral CMC joint pain.  She has not tried [facility] joint braces in the past.  She is been applying Voltaren gel topically as needed and taking diclofenac tablets to 75 mg twice daily PRN for pain relief.  She has seen Dr. Paulla Garcia in the past for hammertoes but has not seen him recently due to not wanting to proceed with surgery.  She is currently having left shoulder joint pain.  She states that she experiences nocturnal pain in the left shoulder.  She states that she was seeing Dr. Noemi Garcia in the past and was going to proceed with surgery but has postponed it due to the COVID-19 pandemic.  She has been walking 4 miles daily.  She has been trying to lose weight and has noticed less knee joint pain bilaterally.  She reports she continues to follow-up with her urologist on a regular basis.  She states she continues to pass uric acid kidney stones.     Activities of Daily Living:  Patient reports  joint stiffness  all day Patient Reports nocturnal pain.  Difficulty dressing/grooming: Denies  Difficulty climbing stairs: Denies Difficulty getting out of chair: Denies Difficulty using hands for taps, buttons, cutlery, and/or writing: Reports  Review of Systems  Constitutional: Negative for fatigue.  HENT: Negative for mouth sores, mouth dryness and nose dryness.   Eyes: Negative for pain, visual disturbance and  dryness.  Respiratory: Negative for cough, hemoptysis, shortness of breath and difficulty breathing.   Cardiovascular: Negative for chest pain, palpitations, hypertension and swelling in legs/feet.  Gastrointestinal: Negative for blood in stool, constipation and diarrhea.  Endocrine: Negative for increased urination.  Genitourinary: Negative for painful urination.  Musculoskeletal: Positive for arthralgias, joint pain and morning stiffness. Negative for joint swelling, myalgias, muscle weakness, muscle tenderness and myalgias.  Skin: Negative for color change, pallor, rash, hair loss, nodules/bumps, skin tightness, ulcers and sensitivity to sunlight.  Allergic/Immunologic: Negative for susceptible to infections.  Neurological: Negative for dizziness, numbness, headaches and weakness.  Hematological: Negative for swollen glands.  Psychiatric/Behavioral: Negative for depressed mood and sleep disturbance. The patient is not nervous/anxious.     PMFS History:  Patient Active Problem List   Diagnosis Date Noted   Dyslipidemia 11/04/2016   Osteoarthritis of foot 04/30/2016   History of hypertension 04/30/2016   History of renal calculi 04/30/2016   Vitamin D deficiency 04/30/2016   History of hypothyroidism 04/30/2016   Primary osteoarthritis of both hands 04/30/2016   Primary osteoarthritis of both knees 04/30/2016   Idiopathic chronic gout of multiple sites without tophus 04/30/2016    Past Medical History:  Diagnosis Date   Anemia    Chronic kidney disease    uric acid kidney stones   GERD (gastroesophageal  reflux disease)    past hx - uses ranitadine OTC prn only    Gout    Hyperlipidemia    Hypertension    Neuromuscular disorder ([facility])    hiatal hernia   Osteoarthritis    thumbs, big toe left foot, top both feet   PONV (postoperative nausea and vomiting)    Thyroid disease     Family History  Problem Relation Age of Onset   Rheum arthritis Mother     Heart attack Father    Colon cancer Cousin    Rheum arthritis Daughter    Hypertension Daughter    Healthy Son    Colon polyps Neg Hx    Esophageal cancer Neg Hx    Rectal cancer Neg Hx    Stomach cancer Neg Hx    Past Surgical History:  Procedure Laterality Date   ABLATION     COLONOSCOPY     DILATION AND CURETTAGE OF UTERUS     EYE SURGERY Bilateral 12/2017   eyelid surgery    SHOULDER SURGERY     repair of tear    Social History   Social History Narrative   Not on file    There is no immunization history on file for this patient.   Objective: Vital Signs: BP 109/78 (BP Location: Left Arm, Patient Position: Sitting, Cuff Size: Normal)    Pulse (!) 59    Resp 13    Ht 5' 4.5" (1.638 m)    Wt 166 lb 12.8 oz (75.7 kg)    BMI 28.19 kg/m    Physical Exam Vitals signs and nursing note reviewed.  Constitutional:      Appearance: She is well-developed.  HENT:     Head: Normocephalic and atraumatic.  Eyes:     Conjunctiva/sclera: Conjunctivae normal.  Neck:     Musculoskeletal: Normal range of motion.  Cardiovascular:     Rate and Rhythm: Normal rate and regular rhythm.     Heart sounds: Normal heart sounds.  Pulmonary:     Effort: Pulmonary effort is normal.     Breath sounds: Normal breath sounds.  Abdominal:     General: Bowel sounds are normal.     Palpations: Abdomen is soft.  Lymphadenopathy:     Cervical: No cervical adenopathy.  Skin:    General: Skin is warm and dry.     Capillary Refill: Capillary refill takes less than 2 seconds.  Neurological:     Mental Status: She is alert and oriented to person, place, and time.  Psychiatric:        Behavior: Behavior normal.      Musculoskeletal Exam: C-spine limited range of motion with lateral rotation.  Thoracic and lumbar spine good range of motion.  No midline spinal tenderness.  No SI joint tenderness.  Shoulder joints have good range of motion with some discomfort in the left shoulder.   Elbow joints, wrist joints, MCPs, PIPs and DIPs good range of motion.  She has tenderness in the right second MCP joint.  She has tenderness of bilateral CMC joints.  She has PIP and DIP synovial thickening consistent with osteoarthritis of bilateral hands.  She has 95% fist formation bilaterally.  Hip joints have good range of motion with no discomfort.  Knee joints have good range of motion with no warmth or effusion.  Ankle joints, MTPs, PIPs, DIPs good range of motion no synovitis.  No warmth or effusion bilateral knee joints.  No tenderness of MTP joints.  She has  PIP and DIP synovial thickening consistent with osteoarthritis of bilateral feet.  Hammertoes present.  CDAI Exam: CDAI Score: -- Patient Global: --; Provider Global: -- Swollen: --; Tender: -- Joint Exam   No joint exam has been documented for this visit   There is currently no information documented on the homunculus. Go to the Rheumatology activity and complete the homunculus joint exam.  Investigation: No additional findings.  Imaging: No results found.  Recent Labs: Lab Results  Component Value Date   WBC 7.5 05/23/2018   HGB 14.4 05/23/2018   PLT 190 05/23/2018   NA 141 05/23/2018   K 4.4 05/23/2018   CL 102 05/23/2018   CO2 28 05/23/2018   GLUCOSE 89 05/23/2018   BUN 23 05/23/2018   CREATININE 1.22 (H) 05/23/2018   BILITOT 0.5 05/23/2018   ALKPHOS 63 11/08/2016   AST 17 05/23/2018   ALT 17 05/23/2018   PROT 6.8 05/23/2018   ALBUMIN 4.3 11/08/2016   CALCIUM 9.4 05/23/2018   GFRAA 55 (L) 05/23/2018    Speciality Comments: No specialty comments available.  Procedures:  No procedures performed Allergies: Penicillins, Sulfa antibiotics, and Codeine   Assessment / Plan:     Visit Diagnoses: Idiopathic chronic gout of multiple sites without tophus -She has not had any recent gout flares.  She continues to pass uric acid kidney stones and is followed by her urologist.  She takes allopurinol 300 mg 1  tablet by mouth daily.  She has not missed any doses of allopurinol recently.  Her uric acid level was 4.4 on 05/23/2018.  We will check her uric acid level today.  She does not need any refills of allopurinol this time.  She was advised to notify us if develops signs or symptoms of a gout flare.  She will follow up in 6 months.  Primary osteoarthritis of both hands -She has PIP and DIP synovial thickening consistent with osteoarthritis of both hands. She has tenderness of bilateral CMC joints.  No obvious synovitis was noted. Lab work and X-rays of both hands were obtained today.    Pain in both hands: She has PIP and DIP synovial thickening consistent with osteoarthritis of both hands.  She is having bilateral CMC joint tenderness.  She has tenderness and thickening of the right 2nd MCP joint .X-rays of both hands were obtained today.  We will check CCP, RF, 14-3-3 eta, sed rate, and uric acid today.    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  She has some discomfort in the left knee joint when going down steps.  She has been walking 4 miles daily.  Her knee joint pain has improved since losing weight.  Primary osteoarthritis of both feet - She has PIP and DIP synovial thickening consistent with osteoarthritis of both feet.  X-rays of both feet were obtained today.  Pain in both feet: She has been having increased pain in both feet.  PIP and DIP synovial thickening consistent with osteoarthritis.  X-rays of both feet were obtained today.  She has been using voltaren gel topically and taking diclofenac 75 1 tablet by mouth BID PRN for pain relief.  She was advised to avoid NSAIDs and to take tylenol as needed for pain relief due to elevated creatinine and low GFR. We discussed the importance of wearing proper fitting shoes.  She has been walking 4 miles daily for exercise.    History of renal calculi -  Followed by urology. She  continues to pass uric acid kidney stones intermittently. She has been taking allopurinol 300 mg po daily.   NSAID long-term use - She takes Diclofenac 75 mg 1 tablet BID PRN for pain relief.  She was advised to avoid taking NSAIDs at this time.  Her creatinine has been chronically elevated.  Creatinine was 1.22 and GFR was 47 on 05/23/18.  We will check CMP today.  She was encouraged to take tylenol for pain relief if necessary.   Medication monitoring encounter: CBC and CMP will be drawn today to monitor for drug toxicity.   Other medical conditions are listed as follows:   History of hypertension   History of vitamin D deficiency   Dyslipidemia   History of hypothyroidism   Orders: No orders of the defined types were placed in this encounter.  No orders of the defined types were placed in this encounter.   Face-to-face time spent with patient was 30  minutes. Greater than 50% of time was spent in counseling and coordination of care.  Follow-Up Instructions: Return in about 6 months (around 05/19/2019) for Gout, Osteoarthritis.   Ofilia Neas, PA-C   I examined and evaluated the patient with Hazel Sams PA.  Patient has been complaining of increased discomfort in her hands and feet with intermittent swelling.  No synovitis was noted on examination today.  X-rays were consistent with osteoarthritis.  She has underlying gout.  We will obtain labs as discussed above.  The plan of care was discussed as noted above.  Bo Merino, MD Note - This record has been created using Editor, commissioning.  Chart creation errors have been sought, but may not always  have been located. Such creation errors do not reflect on  the standard of medical care.

## 2018-11-16 ENCOUNTER — Ambulatory Visit: Payer: Self-pay

## 2018-11-16 ENCOUNTER — Ambulatory Visit: Payer: BC Managed Care – PPO | Admitting: Rheumatology

## 2018-11-16 ENCOUNTER — Other Ambulatory Visit: Payer: Self-pay

## 2018-11-16 ENCOUNTER — Encounter: Payer: Self-pay | Admitting: Rheumatology

## 2018-11-16 VITALS — BP 109/78 | HR 59 | Resp 13 | Ht 64.5 in | Wt 166.8 lb

## 2018-11-16 DIAGNOSIS — M170 Bilateral primary osteoarthritis of knee: Secondary | ICD-10-CM

## 2018-11-16 DIAGNOSIS — M1A09X0 Idiopathic chronic gout, multiple sites, without tophus (tophi): Secondary | ICD-10-CM | POA: Diagnosis not present

## 2018-11-16 DIAGNOSIS — E785 Hyperlipidemia, unspecified: Secondary | ICD-10-CM

## 2018-11-16 DIAGNOSIS — Z87442 Personal history of urinary calculi: Secondary | ICD-10-CM

## 2018-11-16 DIAGNOSIS — Z791 Long term (current) use of non-steroidal anti-inflammatories (NSAID): Secondary | ICD-10-CM

## 2018-11-16 DIAGNOSIS — M19041 Primary osteoarthritis, right hand: Secondary | ICD-10-CM

## 2018-11-16 DIAGNOSIS — M79672 Pain in left foot: Secondary | ICD-10-CM

## 2018-11-16 DIAGNOSIS — M19072 Primary osteoarthritis, left ankle and foot: Secondary | ICD-10-CM

## 2018-11-16 DIAGNOSIS — M79671 Pain in right foot: Secondary | ICD-10-CM

## 2018-11-16 DIAGNOSIS — Z8679 Personal history of other diseases of the circulatory system: Secondary | ICD-10-CM

## 2018-11-16 DIAGNOSIS — Z8639 Personal history of other endocrine, nutritional and metabolic disease: Secondary | ICD-10-CM

## 2018-11-16 DIAGNOSIS — M79641 Pain in right hand: Secondary | ICD-10-CM

## 2018-11-16 DIAGNOSIS — M19071 Primary osteoarthritis, right ankle and foot: Secondary | ICD-10-CM | POA: Diagnosis not present

## 2018-11-16 DIAGNOSIS — M19042 Primary osteoarthritis, left hand: Secondary | ICD-10-CM

## 2018-11-16 DIAGNOSIS — M79642 Pain in left hand: Secondary | ICD-10-CM | POA: Diagnosis not present

## 2018-11-16 DIAGNOSIS — Z5181 Encounter for therapeutic drug level monitoring: Secondary | ICD-10-CM

## 2018-11-17 NOTE — Progress Notes (Signed)
Anti-CCP is negative.

## 2018-11-17 NOTE — Progress Notes (Signed)
CBC WNL. Creatinine borderline elevated but improving.  Sed rate WNL.  Uric acid within desirable range.  RF negative.

## 2018-11-21 LAB — COMPLETE METABOLIC PANEL WITH GFR
AG Ratio: 1.8 (calc) (ref 1.0–2.5)
ALT: 21 U/L (ref 6–29)
AST: 25 U/L (ref 10–35)
Albumin: 4.5 g/dL (ref 3.6–5.1)
Alkaline phosphatase (APISO): 63 U/L (ref 37–153)
BUN/Creatinine Ratio: 22 (calc) (ref 6–22)
BUN: 23 mg/dL (ref 7–25)
CO2: 28 mmol/L (ref 20–32)
Calcium: 9.6 mg/dL (ref 8.6–10.4)
Chloride: 105 mmol/L (ref 98–110)
Creat: 1.03 mg/dL — ABNORMAL HIGH (ref 0.50–0.99)
GFR, Est African American: 67 mL/min/{1.73_m2} (ref >=60)
GFR, Est Non African American: 58 mL/min/{1.73_m2} — ABNORMAL LOW (ref >=60)
Globulin: 2.5 g/dL (calc) (ref 1.9–3.7)
Glucose, Bld: 99 mg/dL (ref 65–99)
Potassium: 4.3 mmol/L (ref 3.5–5.3)
Sodium: 141 mmol/L (ref 135–146)
Total Bilirubin: 0.5 mg/dL (ref 0.2–1.2)
Total Protein: 7 g/dL (ref 6.1–8.1)

## 2018-11-21 LAB — 14-3-3 ETA PROTEIN: 14-3-3 eta Protein: <0.2 ng/mL (ref <0.2)

## 2018-11-21 LAB — CBC WITH DIFFERENTIAL/PLATELET
Absolute Monocytes: 462 cells/uL (ref 200–950)
Basophils Absolute: 29 cells/uL (ref 0–200)
Basophils Relative: 0.5 %
Eosinophils Absolute: 291 cells/uL (ref 15–500)
Eosinophils Relative: 5.1 %
HCT: 43.3 % (ref 35.0–45.0)
Hemoglobin: 14.2 g/dL (ref 11.7–15.5)
Lymphs Abs: 1248 cells/uL (ref 850–3900)
MCH: 30 pg (ref 27.0–33.0)
MCHC: 32.8 g/dL (ref 32.0–36.0)
MCV: 91.4 fL (ref 80.0–100.0)
MPV: 10.5 fL (ref 7.5–12.5)
Monocytes Relative: 8.1 %
Neutro Abs: 3671 cells/uL (ref 1500–7800)
Neutrophils Relative %: 64.4 %
Platelets: 175 10*3/uL (ref 140–400)
RBC: 4.74 10*6/uL (ref 3.80–5.10)
RDW: 13.6 % (ref 11.0–15.0)
Total Lymphocyte: 21.9 %
WBC: 5.7 10*3/uL (ref 3.8–10.8)

## 2018-11-21 LAB — URIC ACID: Uric Acid, Serum: 4.1 mg/dL (ref 2.5–7.0)

## 2018-11-21 LAB — RHEUMATOID FACTOR: Rheumatoid fact SerPl-aCnc: <14 IU/mL (ref <14)

## 2018-11-21 LAB — SEDIMENTATION RATE: Sed Rate: 2 mm/h (ref 0–30)

## 2018-11-21 LAB — CYCLIC CITRUL PEPTIDE ANTIBODY, IGG: Cyclic Citrullin Peptide Ab: <16 UNITS

## 2018-11-22 NOTE — Progress Notes (Signed)
14-3-3 eta negative.

## 2019-05-16 ENCOUNTER — Ambulatory Visit: Payer: BC Managed Care – PPO | Admitting: Rheumatology

## 2019-07-13 NOTE — Progress Notes (Signed)
Office Visit Note  Patient: Jo Garcia             Date of Birth: 08/08/1955           MRN: [id number]             PCP: Haywood Pao, MD Referring: Haywood Pao, MD Visit Date: 07/20/2019 Occupation: @GUAROCC @  Subjective:  Pain in both feet   History of Present Illness: Jo Garcia is a 64 y.o. female with history of osteoarthritis and gout. She is taking allopurinol 300 mg 1 tablet by mouth daily for management of gout.  She denies any recent gout flares.  She has been experiencing increased pain in both hands and both feet.  She has significant discomfort in both CMC joints.  She has noticed decreased grip strength. She has been experiencing nocturnal pain in both feet.  She denies any joint swelling.  She continues to have chronic pain in both hands, which is exacerbated by going down steps or down hill.  She continues to walk 4 miles daily. She ices her knee joints and uses voltaren gel topically.  She has also noticed tenderness over the medial epicondyles of both elbow joints.    Activities of Daily Living:  Patient reports joint stiffness all day  Patient Reports nocturnal pain.  Difficulty dressing/grooming: Reports Difficulty climbing stairs: Reports Difficulty getting out of chair: Reports Difficulty using hands for taps, buttons, cutlery, and/or writing: Reports  Review of Systems  Constitutional: Negative for fatigue.  HENT: Negative for mouth sores, mouth dryness and nose dryness.   Eyes: Negative for itching and dryness.  Respiratory: Negative for shortness of breath and difficulty breathing.   Cardiovascular: Negative for chest pain and palpitations.  Gastrointestinal: Negative for blood in stool, constipation and diarrhea.  Endocrine: Positive for increased urination.  Genitourinary: Negative for difficulty urinating and painful urination.  Musculoskeletal: Positive for arthralgias, joint pain, muscle weakness, morning stiffness and muscle  tenderness. Negative for joint swelling.  Skin: Negative for rash.  Allergic/Immunologic: Negative for susceptible to infections.  Neurological: Negative for dizziness, numbness, headaches, memory loss and weakness.  Hematological: Negative for bruising/bleeding tendency.  Psychiatric/Behavioral: Negative for confusion and sleep disturbance.    PMFS History:  Patient Active Problem List   Diagnosis Date Noted  . Dyslipidemia 11/04/2016  . Osteoarthritis of foot 04/30/2016  . History of hypertension 04/30/2016  . History of renal calculi 04/30/2016  . Vitamin D deficiency 04/30/2016  . History of hypothyroidism 04/30/2016  . Primary osteoarthritis of both hands 04/30/2016  . Primary osteoarthritis of both knees 04/30/2016  . Idiopathic chronic gout of multiple sites without tophus 04/30/2016    Past Medical History:  Diagnosis Date  . Anemia   . Chronic kidney disease    uric acid kidney stones  . GERD (gastroesophageal reflux disease)    past hx - uses ranitadine OTC prn only   . Gout   . Hyperlipidemia   . Hypertension   . Neuromuscular disorder ([place])    hiatal hernia  . Osteoarthritis    thumbs, big toe left foot, top both feet  . PONV (postoperative nausea and vomiting)   . Thyroid disease     Family History  Problem Relation Age of Onset  . Rheum arthritis Mother   . Heart attack Father   . Colon cancer Cousin   . Rheum arthritis Daughter   . Hypertension Daughter   . Healthy Son   . Colon polyps Neg Hx   .  Esophageal cancer Neg Hx   . Rectal cancer Neg Hx   . Stomach cancer Neg Hx    Past Surgical History:  Procedure Laterality Date  . ABLATION    . COLONOSCOPY    . DILATION AND CURETTAGE OF UTERUS    . EYE SURGERY Bilateral 12/2017   eyelid surgery   . SHOULDER SURGERY     repair of tear    Social History   Social History Narrative  . Not on file    There is no immunization history on file for this patient.   Objective: Vital Signs: BP  103/72 (BP Location: Left Wrist, Patient Position: Sitting, Cuff Size: Normal)   Pulse (!) 53   Resp 13   Ht 5' 4.5" (1.638 m)   Wt 169 lb (76.7 kg)   BMI 28.56 kg/m    Physical Exam Vitals and nursing note reviewed.  Constitutional:      Appearance: She is well-developed.  HENT:     Head: Normocephalic and atraumatic.  Eyes:     Conjunctiva/sclera: Conjunctivae normal.  Pulmonary:     Effort: Pulmonary effort is normal.  Abdominal:     General: Bowel sounds are normal.     Palpations: Abdomen is soft.  Musculoskeletal:     Cervical back: Normal range of motion.  Lymphadenopathy:     Cervical: No cervical adenopathy.  Skin:    General: Skin is warm and dry.     Capillary Refill: Capillary refill takes less than 2 seconds.  Neurological:     Mental Status: She is alert and oriented to person, place, and time.  Psychiatric:        Behavior: Behavior normal.      Musculoskeletal Exam: C-spine good ROM.  Postural thoracic kyphosis noted.  Shoulder joints good ROM with no discomfort.  Elbow joints good ROM with no inflammation.  Tenderness over medial epicondyles of both elbows.  Wrist joints, MCPs, PIPs, and DIPs good ROM with no synovitis.  PIP and DIP thickening consistent with osteoarthritis of both hands.  CMC joint thickening and tenderness bilaterally.  Hip joints, knee joints, ankle joints, MTPs, PIPs, and DIPs good ROM with no synovitis.  No warmth or effusion of knee joints.  No tenderness or swelling of ankle joints.  Dorsal spurs noted.  PIP and DIP thickening consistent with osteoarthritis.   CDAI Exam: CDAI Score: -- Patient Global: --; Provider Global: -- Swollen: --; Tender: -- Joint Exam 07/20/2019   No joint exam has been documented for this visit   There is currently no information documented on the homunculus. Go to the Rheumatology activity and complete the homunculus joint exam.  Investigation: No additional findings.  Imaging: No results  found.  Recent Labs: Lab Results  Component Value Date   WBC 5.7 11/16/2018   HGB 14.2 11/16/2018   PLT 175 11/16/2018   NA 141 11/16/2018   K 4.3 11/16/2018   CL 105 11/16/2018   CO2 28 11/16/2018   GLUCOSE 99 11/16/2018   BUN 23 11/16/2018   CREATININE 1.03 (H) 11/16/2018   BILITOT 0.5 11/16/2018   ALKPHOS 63 11/08/2016   AST 25 11/16/2018   ALT 21 11/16/2018   PROT 7.0 11/16/2018   ALBUMIN 4.3 11/08/2016   CALCIUM 9.6 11/16/2018   GFRAA 67 11/16/2018    Speciality Comments: No specialty comments available.  Procedures:  No procedures performed Allergies: Penicillins, Sulfa antibiotics, and Codeine   Assessment / Plan:     Visit Diagnoses: Idiopathic chronic  gout of multiple sites without tophus -She has not had any recent gout flares.  She is clinically doing well on allopurinol 300 mg 1 tablet by mouth daily.  She has not missed any doses of allopurinol recently.  Her uric acid level was 4.1 on 11/16/2018.  We will recheck uric acid level today.  She will continue taking allopurinol as prescribed.  She was advised to notify us if she develops signs or symptoms of a gout flare.- Plan: CBC with Differential/Platelet, COMPLETE METABOLIC PANEL WITH GFR, Uric acid  Primary osteoarthritis of both hands: She has PIP and DIP thickening consistent with osteoarthritis of both hands.  She has discomfort in bilateral CMC joints.  She has no synovitis on exam.  RF, 14 3 3  eta, anti-CCP were negative on 11/16/2018.  Sed rate was within normal limits at that time.  We discussed purchasing CMC joint braces to wear.  Joint protection and muscle strengthening were discussed.  Several hand exercises were demonstrated today.  She was given a handout of hand exercises to perform.  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  She has good range of motion of both knees on  exam.  No warmth or effusion was noted.  She uses Voltaren gel and ice topically for pain relief.  We discussed the importance of lower extremity muscle strengthening.  Primary osteoarthritis of both feet: She presents today with increased pain in both feet.  She has PIP and DIP thickening consistent with osteoarthritis of both feet.  Dorsal spurs noted bilaterally.  She has been experiencing nocturnal pain in both feet.  No synovitis or dactylitis was noted.  We discussed the importance of wearing proper fitting shoes.  We also discussed she may need to be fitted for orthotics.  She was given ankle joint exercises to start performing.  NSAID long-term use: She takes diclofenac 75 mg 1 tablet by mouth as needed for pain relief.  She has been taking diclofenac very sparingly.  Medial epicondylitis of both elbows: She has tenderness bilaterally.  We discussed that she can use Voltaren gel topically as needed.  She was also given a handout of exercises to perform.  We discussed the importance of avoiding overuse activities.   Other medical conditions are listed as follows:  History of vitamin D deficiency  History of renal calculi  History of hypothyroidism  History of hypertension  Dyslipidemia    Orders: Orders Placed This Encounter  Procedures  . CBC with Differential/Platelet  . COMPLETE METABOLIC PANEL WITH GFR  . Uric acid   No orders of the defined types were placed in this encounter.   Face-to-face time spent with patient was 30 minutes. Greater than 50% of time was spent in counseling and coordination of care.  Follow-Up Instructions: Return in about 6 months (around 01/20/2020) for Osteoarthritis, Gout.   Ofilia Neas, PA-C   I examined and evaluated the patient with Hazel Sams PA.  Patient has not had any gout flare.  Although she continues to have some discomfort from underlying osteoarthritis.  She had no synovitis on examination.  The plan of care was discussed as  noted above.  Bo Merino, MD  Note - This record has been created using Editor, commissioning.  Chart creation errors have been sought, but may not always  have been located. Such creation errors do not reflect on  the standard of medical care. 

## 2019-07-20 ENCOUNTER — Other Ambulatory Visit: Payer: Self-pay

## 2019-07-20 ENCOUNTER — Encounter: Payer: Self-pay | Admitting: Rheumatology

## 2019-07-20 ENCOUNTER — Encounter (INDEPENDENT_AMBULATORY_CARE_PROVIDER_SITE_OTHER): Payer: Self-pay

## 2019-07-20 ENCOUNTER — Ambulatory Visit: Payer: BC Managed Care – PPO | Admitting: Rheumatology

## 2019-07-20 VITALS — BP 103/72 | HR 53 | Resp 13 | Ht 64.5 in | Wt 169.0 lb

## 2019-07-20 DIAGNOSIS — M19072 Primary osteoarthritis, left ankle and foot: Secondary | ICD-10-CM

## 2019-07-20 DIAGNOSIS — Z8639 Personal history of other endocrine, nutritional and metabolic disease: Secondary | ICD-10-CM

## 2019-07-20 DIAGNOSIS — M19041 Primary osteoarthritis, right hand: Secondary | ICD-10-CM | POA: Diagnosis not present

## 2019-07-20 DIAGNOSIS — M7702 Medial epicondylitis, left elbow: Secondary | ICD-10-CM

## 2019-07-20 DIAGNOSIS — Z87442 Personal history of urinary calculi: Secondary | ICD-10-CM

## 2019-07-20 DIAGNOSIS — Z791 Long term (current) use of non-steroidal anti-inflammatories (NSAID): Secondary | ICD-10-CM

## 2019-07-20 DIAGNOSIS — M19071 Primary osteoarthritis, right ankle and foot: Secondary | ICD-10-CM | POA: Diagnosis not present

## 2019-07-20 DIAGNOSIS — Z8679 Personal history of other diseases of the circulatory system: Secondary | ICD-10-CM

## 2019-07-20 DIAGNOSIS — M1A09X0 Idiopathic chronic gout, multiple sites, without tophus (tophi): Secondary | ICD-10-CM | POA: Diagnosis not present

## 2019-07-20 DIAGNOSIS — E785 Hyperlipidemia, unspecified: Secondary | ICD-10-CM

## 2019-07-20 DIAGNOSIS — M7701 Medial epicondylitis, right elbow: Secondary | ICD-10-CM

## 2019-07-20 DIAGNOSIS — M19042 Primary osteoarthritis, left hand: Secondary | ICD-10-CM

## 2019-07-20 DIAGNOSIS — M170 Bilateral primary osteoarthritis of knee: Secondary | ICD-10-CM | POA: Diagnosis not present

## 2019-07-20 NOTE — Patient Instructions (Addendum)
Hand Exercises Hand exercises can be helpful for almost anyone. These exercises can strengthen the hands, improve flexibility and movement, and increase blood flow to the hands. These results can make work and daily tasks easier. Hand exercises can be especially helpful for people who have joint pain from arthritis or have nerve damage from overuse (carpal tunnel syndrome). These exercises can also help people who have injured a hand. Exercises Most of these hand exercises are gentle stretching and motion exercises. It is usually safe to do them often throughout the day. Warming up your hands before exercise may help to reduce stiffness. You can do this with gentle massage or by placing your hands in warm water for 10-15 minutes. It is normal to feel some stretching, pulling, tightness, or mild discomfort as you begin new exercises. This will gradually improve. Stop an exercise right away if you feel sudden, severe pain or your pain gets worse. Ask your health care provider which exercises are best for you. Knuckle bend or "claw" fist 1. Stand or sit with your arm, hand, and all five fingers pointed straight up. Make sure to keep your wrist straight during the exercise. 2. Gently bend your fingers down toward your palm until the tips of your fingers are touching the top of your palm. Keep your big knuckle straight and just bend the small knuckles in your fingers. 3. Hold this position for __________ seconds. 4. Straighten (extend) your fingers back to the starting position. Repeat this exercise 5-10 times with each hand. Full finger fist 1. Stand or sit with your arm, hand, and all five fingers pointed straight up. Make sure to keep your wrist straight during the exercise. 2. Gently bend your fingers into your palm until the tips of your fingers are touching the middle of your palm. 3. Hold this position for __________ seconds. 4. Extend your fingers back to the starting position, stretching every  joint fully. Repeat this exercise 5-10 times with each hand. Straight fist 1. Stand or sit with your arm, hand, and all five fingers pointed straight up. Make sure to keep your wrist straight during the exercise. 2. Gently bend your fingers at the big knuckle, where your fingers meet your hand, and the middle knuckle. Keep the knuckle at the tips of your fingers straight and try to touch the bottom of your palm. 3. Hold this position for __________ seconds. 4. Extend your fingers back to the starting position, stretching every joint fully. Repeat this exercise 5-10 times with each hand. Tabletop 1. Stand or sit with your arm, hand, and all five fingers pointed straight up. Make sure to keep your wrist straight during the exercise. 2. Gently bend your fingers at the big knuckle, where your fingers meet your hand, as far down as you can while keeping the small knuckles in your fingers straight. Think of forming a tabletop with your fingers. 3. Hold this position for __________ seconds. 4. Extend your fingers back to the starting position, stretching every joint fully. Repeat this exercise 5-10 times with each hand. Finger spread 1. Place your hand flat on a table with your palm facing down. Make sure your wrist stays straight as you do this exercise. 2. Spread your fingers and thumb apart from each other as far as you can until you feel a gentle stretch. Hold this position for __________ seconds. 3. Bring your fingers and thumb tight together again. Hold this position for __________ seconds. Repeat this exercise 5-10 times with each hand.   Making circles 1. Stand or sit with your arm, hand, and all five fingers pointed straight up. Make sure to keep your wrist straight during the exercise. 2. Make a circle by touching the tip of your thumb to the tip of your index finger. 3. Hold for __________ seconds. Then open your hand wide. 4. Repeat this motion with your thumb and each finger on your  hand. Repeat this exercise 5-10 times with each hand. Thumb motion 1. Sit with your forearm resting on a table and your wrist straight. Your thumb should be facing up toward the ceiling. Keep your fingers relaxed as you move your thumb. 2. Lift your thumb up as high as you can toward the ceiling. Hold for __________ seconds. 3. Bend your thumb across your palm as far as you can, reaching the tip of your thumb for the small finger (pinkie) side of your palm. Hold for __________ seconds. Repeat this exercise 5-10 times with each hand. Grip strengthening  1. Hold a stress ball or other soft ball in the middle of your hand. 2. Slowly increase the pressure, squeezing the ball as much as you can without causing pain. Think of bringing the tips of your fingers into the middle of your palm. All of your finger joints should bend when doing this exercise. 3. Hold your squeeze for __________ seconds, then relax. Repeat this exercise 5-10 times with each hand. Contact a health care provider if:  Your hand pain or discomfort gets much worse when you do an exercise.  Your hand pain or discomfort does not improve within 2 hours after you exercise. If you have any of these problems, stop doing these exercises right away. Do not do them again unless your health care provider says that you can. Get help right away if:  You develop sudden, severe hand pain or swelling. If this happens, stop doing these exercises right away. Do not do them again unless your health care provider says that you can. This information is not intended to replace advice given to you by your health care provider. Make sure you discuss any questions you have with your health care provider. Document Revised: 08/17/2018 Document Reviewed: 04/27/2018 Elsevier Patient Education  [place] Ask your health care provider which exercises are safe for you. Do exercises exactly as told by your health care  provider and adjust them as directed. It is normal to feel mild stretching, pulling, tightness, or discomfort as you do these exercises. Stop right away if you feel sudden pain or your pain gets worse. Do not begin these exercises until told by your health care provider. Stretching and range-of-motion exercises These exercises warm up your muscles and joints and improve the movement and flexibility of your elbow. Wrist extension  1. Straighten your left / right elbow in front of you with your palm facing up toward the ceiling. ? If told by your health care provider, bend your left / right elbow to a 90-degree angle (right angle) at your side. 2. With your other hand, gently pull your left / right hand and fingers toward the floor (extension). Stop when you feel a gentle stretch on the palm side of your forearm. 3. Hold this position for __________ seconds. Repeat __________ times. Complete this exercise __________ times a day. Wrist flexion  1. Straighten your left / right elbow in front of you with your palm facing down toward the floor. ? If told by your health  care provider, bend your left / right elbow to a 90-degree angle (right angle) at your side. 2. With your other hand, gently push over the back of your left / right hand so your fingers point toward the floor (flexion). Stop when you feel a gentle stretch on the back of your forearm. 3. Hold this position for __________ seconds. Repeat __________ times. Complete this exercise __________ times a day. Forearm rotation, supination 1. Sit or stand with your elbows at your side. 2. Bend your left / right elbow to a 90-degree angle (right angle). 3. Using your uninjured hand, turn your left / right palm up toward the ceiling (supination) until you feel a gentle stretch along the inside of your forearm. 4. Hold this position for __________ seconds. Repeat __________ times. Complete this exercise __________ times a day. Forearm rotation,  pronation 1. Sit or stand with your elbows at your side. 2. Bend your left / right elbow to a 90-degree angle (right angle). 3. Using your uninjured hand, turn your left / right palm down toward the floor (pronation) until you feel a gentle stretch along the top of your forearm. 4. Hold this position for __________ seconds. Repeat __________ times. Complete this exercise __________ times a day. Strengthening exercises These exercises build strength and endurance in your elbow. Endurance is the ability to use your muscles for a long time, even after they get tired. Wrist flexion  1. Sit with your left / right forearm supported on a table or other surface and your palm turned up toward the ceiling. Let your left / right wrist extend over the edge of the surface. 2. Hold a __________ weight or a piece of rubber exercise band or tubing. ? If using a rubber exercise band or tubing, hold the other end of the tubing with your other hand. 3. Slowly bend your wrist so your hand moves up toward the ceiling (flexion). Try to only move your wrist and keep the rest of your arm still. 4. Hold this position for __________ seconds. 5. Slowly return to the starting position. Repeat __________ times. Complete this exercise __________ times a day. Wrist flexion, eccentric 1. Sit with your left / right forearm palm-up and supported on a table or other surface. Let your left / right wrist extend over the edge of the surface. 2. Hold a __________ weight or a piece of rubber exercise band or tubing in your left / right hand. ? If using a rubber exercise band or tubing, hold the other end of the tubing with your other hand. 3. Use your uninjured hand to move your left / right hand up toward the ceiling. 4. Take your uninjured hand away and slowly return to the starting position using only your left / right hand (eccentric flexion). Repeat __________ times. Complete this exercise __________ times a day. Forearm  rotation, pronation To do this exercise, you will need a lightweight hammer or rubber mallet. 1. Sit with your left / right forearm supported on a table or other surface. Bend your elbow to a 90-degree angle (right angle). Position your forearm so that your palm is facing up toward the ceiling, with your hand resting over the edge of the table. 2. Hold a hammer in your left / right hand. ? To make this exercise easier, hold the hammer near the head of the hammer. ? To make this exercise harder, hold the hammer near the end of the handle. 3. Without moving your elbow, slowly turn (rotate)  your forearm so your palm faces down toward the floor (pronation). 4. Hold this position for __________ seconds. 5. Slowly return to the starting position. Repeat __________ times. Complete this exercise __________ times a day. Shoulder blade squeeze 1. Sit in a stable chair or stand with good posture. If you are sitting down, do not let your back touch the back of the chair. 2. Your arms should be at your sides with your elbows bent to a 90-degree angle (right angle). Position your forearms so that your thumbs are facing the ceiling (neutral position). 3. Without lifting your shoulders up, squeeze your shoulder blades tightly together. 4. Hold this position for __________ seconds. 5. Slowly release and return to the starting position. Repeat __________ times. Complete this exercise __________ times a day. This information is not intended to replace advice given to you by your health care provider. Make sure you discuss any questions you have with your health care provider. Document Revised: 08/17/2018 Document Reviewed: 06/20/2018 Elsevier Patient Education  [place].  Ankle Exercises Ask your health care provider which exercises are safe for you. Do exercises exactly as told by your health care provider and adjust them as directed. It is normal to feel mild stretching, pulling, tightness, or mild  discomfort as you do these exercises. Stop right away if you feel sudden pain or your pain gets worse. Do not begin these exercises until told by your health care provider. Stretching and range-of-motion exercises These exercises warm up your muscles and joints and improve the movement and flexibility of your ankle. These exercises may also help to relieve pain. Dorsiflexion/plantar flexion  4. Sit with your __________ knee straight or bent. Do not rest your foot on anything. 5. Flex your __________ ankle to tilt the top of your foot toward your shin. This is called dorsiflexion. 6. Hold this position for __________ seconds. 7. Point your toes downward to tilt the top of your foot away from your shin. This is called plantar flexion. 8. Hold this position for __________ seconds. Repeat __________ times. Complete this exercise __________ times a day. Ankle alphabet  4. Sit with your __________ foot supported at your lower leg. ? Do not rest your foot on anything. ? Make sure your foot has room to move freely. 5. Think of your __________ foot as a paintbrush: ? Move your foot to trace each letter of the alphabet in the air. Keep your hip and knee still while you trace the letters. Trace every letter from A to Z. ? Make the letters as large as you can without causing or increasing any discomfort. Repeat __________ times. Complete this exercise __________ times a day. Passive ankle dorsiflexion This is an exercise in which something or someone moves your ankle for you. You do not move it yourself. 5. Sit on a chair that is placed on a non-carpeted surface. 6. Place your __________ foot on the floor, directly under your __________ knee. Extend your __________ leg for support. 7. Keeping your heel down, slide your __________ foot back toward the chair until you feel a stretch at your ankle or calf. If you do not feel a stretch, slide your buttocks forward to the edge of the chair while keeping your  heel down. 8. Hold this stretch for __________ seconds. Repeat __________ times. Complete this exercise __________ times a day. Strengthening exercises These exercises build strength and endurance in your ankle. Endurance is the ability to use your muscles for a long time, even after  they get tired. Dorsiflexors These are muscles that lift your foot up. 5. Secure a rubber exercise band or tube to an object, such as a table leg, that will stay still when the band is pulled. Secure the other end around your __________ foot. 6. Sit on the floor, facing the object with your __________ leg extended. The band or tube should be slightly tense when your foot is relaxed. 7. Slowly flex your __________ ankle and toes to bring your foot toward your shin. 8. Hold this position for __________ seconds. 9. Slowly return your foot to the starting position, controlling the band as you do that. Repeat __________ times. Complete this exercise __________ times a day. Plantar flexors These are muscles that push your foot down. 6. Sit on the floor with your __________ leg extended. 7. Loop a rubber exercise band or tube around the ball of your __________ foot. The ball of your foot is on the walking surface, right under your toes. The band or tube should be slightly tense when your foot is relaxed. 8. Slowly point your toes downward, pushing them away from you. 9. Hold this position for __________ seconds. 10. Slowly release the tension in the band or tube, controlling smoothly until your foot is back in the starting position. Repeat __________ times. Complete this exercise __________ times a day. Towel curls  5. Sit in a chair on a non-carpeted surface, and put your feet on the floor. 6. Place a towel in front of your feet. If told by your health care provider, add a __________ pound weight to the end of the towel. 7. Keeping your heel on the floor, put your __________ foot on the towel. 8. Pull the towel  toward you by grabbing the towel with your toes and curling them under. Keep your heel on the floor. 9. Let your toes relax. 10. Grab the towel again. Keep pulling the towel until it is completely underneath your foot. Repeat __________ times. Complete this exercise __________ times a day. Standing plantar flexion This is an exercise in which you use your toes to lift your body's weight while standing. 6. Stand with your feet shoulder-width apart. 7. Keep your weight spread evenly over the width of your feet while you rise up on your toes. Use a wall or table to steady yourself if needed, but try not to use it for support. 8. If this exercise is too easy, try these options: ? Shift your weight toward your __________ leg until you feel challenged. ? If told by your health care provider, lift your uninjured leg off the floor. 9. Hold this position for __________ seconds. Repeat __________ times. Complete this exercise __________ times a day. Tandem walking 6. Stand with one foot directly in front of the other. 7. Slowly raise your back foot up, lifting your heel before your toes, and place it directly in front of your other foot. 8. Continue to walk in this heel-to-toe way for __________ or for as long as told by your health care provider. Have a countertop or wall nearby to use if needed to keep your balance, but try not to hold onto anything for support. Repeat __________ times. Complete this exercise __________ times a day. This information is not intended to replace advice given to you by your health care provider. Make sure you discuss any questions you have with your health care provider. Document Revised: 01/21/2018 Document Reviewed: 01/23/2018 Elsevier Patient Education  [place].

## 2019-07-21 LAB — CBC WITH DIFFERENTIAL/PLATELET
Absolute Monocytes: 470 cells/uL (ref 200–950)
Basophils Absolute: 41 cells/uL (ref 0–200)
Basophils Relative: 0.7 %
Eosinophils Absolute: 360 cells/uL (ref 15–500)
Eosinophils Relative: 6.2 %
HCT: 42.3 % (ref 35.0–45.0)
Hemoglobin: 14.1 g/dL (ref 11.7–15.5)
Lymphs Abs: 1444 cells/uL (ref 850–3900)
MCH: 30.5 pg (ref 27.0–33.0)
MCHC: 33.3 g/dL (ref 32.0–36.0)
MCV: 91.6 fL (ref 80.0–100.0)
MPV: 10.7 fL (ref 7.5–12.5)
Monocytes Relative: 8.1 %
Neutro Abs: 3486 cells/uL (ref 1500–7800)
Neutrophils Relative %: 60.1 %
Platelets: 182 10*3/uL (ref 140–400)
RBC: 4.62 10*6/uL (ref 3.80–5.10)
RDW: 14.4 % (ref 11.0–15.0)
Total Lymphocyte: 24.9 %
WBC: 5.8 10*3/uL (ref 3.8–10.8)

## 2019-07-21 LAB — COMPLETE METABOLIC PANEL WITH GFR
AG Ratio: 2.1 (calc) (ref 1.0–2.5)
ALT: 19 U/L (ref 6–29)
AST: 24 U/L (ref 10–35)
Albumin: 4.6 g/dL (ref 3.6–5.1)
Alkaline phosphatase (APISO): 65 U/L (ref 37–153)
BUN/Creatinine Ratio: 21 (calc) (ref 6–22)
BUN: 23 mg/dL (ref 7–25)
CO2: 29 mmol/L (ref 20–32)
Calcium: 10.2 mg/dL (ref 8.6–10.4)
Chloride: 104 mmol/L (ref 98–110)
Creat: 1.11 mg/dL — ABNORMAL HIGH (ref 0.50–0.99)
GFR, Est African American: 61 mL/min/{1.73_m2} (ref >=60)
GFR, Est Non African American: 53 mL/min/{1.73_m2} — ABNORMAL LOW (ref >=60)
Globulin: 2.2 g/dL (calc) (ref 1.9–3.7)
Glucose, Bld: 104 mg/dL — ABNORMAL HIGH (ref 65–99)
Potassium: 4 mmol/L (ref 3.5–5.3)
Sodium: 141 mmol/L (ref 135–146)
Total Bilirubin: 0.5 mg/dL (ref 0.2–1.2)
Total Protein: 6.8 g/dL (ref 6.1–8.1)

## 2019-07-21 LAB — URIC ACID: Uric Acid, Serum: 3.9 mg/dL (ref 2.5–7.0)

## 2019-07-23 NOTE — Progress Notes (Signed)
CBC WNL.  Creatinine is mildly elevated. GFR is 53.  Please advise patient to avoid taking oral Diclofenac and other NSAIDs due to abnormal kidney function.

## 2020-01-04 NOTE — Progress Notes (Signed)
Office Visit Note  Patient: Jo Garcia             Date of Birth: 06-Aug-1955           MRN: [id number]             PCP: Haywood Pao, MD Referring: Haywood Pao, MD Visit Date: 01/18/2020 Occupation: @GUAROCC @  Subjective:  Other (12/26/2019 leg pain/spasms began. saw Dr. French Ana at [facility] (patient brought MRI results). patient has an appointment with neuro next month)   History of Present Illness: Jo Garcia is a 64 y.o. female with history of gout and osteoarthritis.  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  She states she has been walking about 3 miles a day.  She was also doing some yard work.  1 evening she walked 1 mile and went to bed.  She states she woke up in the middle of the night with severe pain in her lower back and her right buttock area which was radiating down her right lower extremity and wrapped around her right knee.  She was seen by Dr. French Ana who evaluated her and did MRI of her lumbar spine and her right hip joint.  He gave her right trochanteric bursa injection which relieved some of her symptoms.  She was also placed on meloxicam and methocarbamol.  She still has discomfort in the right gluteal region.  She states she is unable to sleep on her sides as well.  She has been using ice pack at night in the gluteal region.  She has right knee joint pain as well.  She has been using Voltaren gel on her knee.  She had MRI done by Dr. French Ana of her lumbar spine which showed degenerative changes and mild to moderate spinal stenosis.  She also had MRI of her right hip joint which showed gluteus minimus tendinosis on the right side and right SI joint sclerosis.  She has not had any gout flares.  Activities of Daily Living:  Patient reports morning stiffness for 0 minutes.   Patient Reports nocturnal pain.  Difficulty dressing/grooming: Denies Difficulty climbing stairs:  Reports Difficulty getting out of chair: Denies Difficulty using hands for taps, buttons, cutlery, and/or writing: Denies  Review of Systems  Constitutional: Positive for fatigue.  HENT: Negative for mouth sores, mouth dryness and nose dryness.   Eyes: Negative for itching and dryness.  Respiratory: Negative for shortness of breath and difficulty breathing.   Cardiovascular: Negative for chest pain and palpitations.  Gastrointestinal: Negative for blood in stool, constipation and diarrhea.  Endocrine: Negative for increased urination.  Genitourinary: Negative for difficulty urinating.  Musculoskeletal: Positive for arthralgias, joint pain, joint swelling, myalgias, muscle tenderness and myalgias. Negative for morning stiffness.  Skin: Negative for color change, rash and redness.  Allergic/Immunologic: Negative for susceptible to infections.  Neurological: Negative for dizziness, numbness, headaches, memory loss and weakness.  Hematological: Negative for bruising/bleeding tendency.  Psychiatric/Behavioral: Negative for confusion.    PMFS History:  Patient Active Problem List   Diagnosis Date Noted   Medial epicondylitis of both elbows 01/18/2020   Dyslipidemia 11/04/2016   Osteoarthritis of foot 04/30/2016   History of hypertension 04/30/2016   History of renal calculi 04/30/2016   Vitamin D deficiency 04/30/2016   History of hypothyroidism 04/30/2016   Primary osteoarthritis of both hands 04/30/2016   Primary osteoarthritis of both knees 04/30/2016   Idiopathic chronic gout  of multiple sites without tophus 04/30/2016    Past Medical History:  Diagnosis Date   Anemia    Chronic kidney disease    uric acid kidney stones   GERD (gastroesophageal reflux disease)    past hx - uses ranitadine OTC prn only    Gout    Hyperlipidemia    Hypertension    Neuromuscular disorder (HCC)    hiatal hernia   Osteoarthritis    thumbs, big toe left foot, top both feet    PONV (postoperative nausea and vomiting)    Thyroid disease     Family History  Problem Relation Age of Onset   Rheum arthritis Mother    Heart attack Father    Colon cancer Cousin    Rheum arthritis Daughter    Hypertension Daughter    Healthy Son    Colon polyps Neg Hx    Esophageal cancer Neg Hx    Rectal cancer Neg Hx    Stomach cancer Neg Hx    Past Surgical History:  Procedure Laterality Date   ABLATION     COLONOSCOPY     DILATION AND CURETTAGE OF UTERUS     EYE SURGERY Bilateral 12/2017   eyelid surgery    SHOULDER SURGERY     repair of tear    Social History   Social History Narrative   Not on file   Immunization History  Administered Date(s) Administered   PFIZER SARS-COV-2 Vaccination 07/25/2019, 08/15/2019     Objective: Vital Signs: BP 120/82 (BP Location: Left Arm, Patient Position: Sitting, Cuff Size: Normal)    Pulse 64    Resp 14    Ht 5' 4.5" (1.638 m)    Wt 158 lb 6.4 oz (71.8 kg)    BMI 26.77 kg/m    Physical Exam Vitals and nursing note reviewed.  Constitutional:      Appearance: She is well-developed.  HENT:     Head: Normocephalic and atraumatic.  Eyes:     Conjunctiva/sclera: Conjunctivae normal.  Cardiovascular:     Rate and Rhythm: Normal rate and regular rhythm.     Heart sounds: Normal heart sounds.  Pulmonary:     Effort: Pulmonary effort is normal.     Breath sounds: Normal breath sounds.  Abdominal:     General: Bowel sounds are normal.     Palpations: Abdomen is soft.  Musculoskeletal:     Cervical back: Normal range of motion.  Lymphadenopathy:     Cervical: No cervical adenopathy.  Skin:    General: Skin is warm and dry.     Capillary Refill: Capillary refill takes less than 2 seconds.  Neurological:     Mental Status: She is alert and oriented to person, place, and time.  Psychiatric:        Behavior: Behavior normal.      Musculoskeletal Exam: C-spine was in good range of motion.  She has  discomfort in the lower lumbar region.  There is no point tenderness.  No SI joint tenderness was noted.  She had tenderness over right gluteus medius region.  Mild tenderness on right trochanteric bursa was noted.  Shoulder joints, elbow joints were in good range of motion.  She has bilateral DIP and PIP thickening with incomplete fist formation.  Hip joints and knee joints with good range of motion with no warmth swelling or effusion.  There was no tenderness across MTPs.  CDAI Exam: CDAI Score: -- Patient Global: --; Provider Global: -- Swollen: --; Tender: --  Joint Exam 01/18/2020   No joint exam has been documented for this visit   There is currently no information documented on the homunculus. Go to the Rheumatology activity and complete the homunculus joint exam.  Investigation: No additional findings.  Imaging: No results found.  Recent Labs: Lab Results  Component Value Date   WBC 5.8 07/20/2019   HGB 14.1 07/20/2019   PLT 182 07/20/2019   NA 141 07/20/2019   K 4.0 07/20/2019   CL 104 07/20/2019   CO2 29 07/20/2019   GLUCOSE 104 (H) 07/20/2019   BUN 23 07/20/2019   CREATININE 1.11 (H) 07/20/2019   BILITOT 0.5 07/20/2019   ALKPHOS 63 11/08/2016   AST 24 07/20/2019   ALT 19 07/20/2019   PROT 6.8 07/20/2019   ALBUMIN 4.3 11/08/2016   CALCIUM 10.2 07/20/2019   GFRAA 61 07/20/2019    January 01, 2020 MRI of the lumbar spine done at [facility] orthopedic specialist show 3 mm anterolisthesis L4-5 with severe facet joint degeneration.  Left L5 nerve root impingement in the subarticular zone.  L4-5 mild to moderate spinal stenosis was noted. January 07, 2020 MRI of the right hip joint showed mild to moderate osteoarthritis of the left sacroiliac joint no hip fracture was noted.  Mild tendinosis of right gluteus minimus tendon insertion was noted.  Speciality Comments: No specialty comments available.  Procedures:  No procedures performed Allergies: Penicillins, Sulfa  antibiotics, and Codeine   Assessment / Plan:     Visit Diagnoses: Idiopathic chronic gout of multiple sites without tophus - Allopurinol 300 mg daily -patient denies having any flare of gout.  Her uric acid is in desirable range.  She has been taking allopurinol on a regular basis.  Plan: Uric acid  Medication monitoring encounter -we will obtain labs today.  Plan: CBC with Differential/Platelet, COMPLETE METABOLIC PANEL WITH GFR  Primary osteoarthritis of both hands-she continues to have joint pain and stiffness.  Joint protection muscle strengthening was discussed.  Primary osteoarthritis of both knees-she complains of right knee joint.  No warmth swelling or effusion was noted.  I believe that is from lumbar radiculopathy.  Primary osteoarthritis of both feet-proper fitting shoes were discussed.  DDD (degenerative disc disease), lumbar -she has been experiencing some lower back pain and right-sided radiculopathy.  MRI done was Dr. French Ana was reviewed at length and discussed with the patient.  I will refer her to physical therapy.  L4-5 spondylolisthesis, mild to moderate spinal stenosis at L4-5 and facet joint arthropathy.  Handout on exercises was given.  Gluteal pain -she has been experiencing gluteal pain.  MRI was done by Dr. Celesta Aver which was reviewed and discussed with the patient.  MRI showed right SI joint to sclerosis and right gluteus minimus tendinopathy.  I will refer her to physical therapy.  Handout on exercises was given.  History of vitamin D deficiency  History of renal calculi  History of hypertension  Dyslipidemia  History of hypothyroidism  Osteoporosis screening - Patient reports that she gets DEXA scan with her PCP which has been normal.  Educated about COVID-19 virus infection-she is fully immunized against COVID-19.  Have advised her to get a booster when it is available.  Use of mask, social distancing and hand hygiene was discussed.  I also advised to  contact her PCP or our office in case she gets COVID-19 infection to check if she is eligible for monoclonal antibody infusion.  Orders: Orders Placed This Encounter  Procedures   CBC  with Differential/Platelet   COMPLETE METABOLIC PANEL WITH GFR   Uric acid   No orders of the defined types were placed in this encounter.     Follow-Up Instructions: Return in about 6 months (around 07/17/2020) for Osteoarthritis, Gout.   Bo Merino, MD  Note - This record has been created using Editor, commissioning.  Chart creation errors have been sought, but may not always  have been located. Such creation errors do not reflect on  the standard of medical care.

## 2020-01-18 ENCOUNTER — Encounter: Payer: Self-pay | Admitting: Rheumatology

## 2020-01-18 ENCOUNTER — Other Ambulatory Visit: Payer: Self-pay

## 2020-01-18 ENCOUNTER — Ambulatory Visit: Payer: BC Managed Care – PPO | Admitting: Rheumatology

## 2020-01-18 VITALS — BP 120/82 | HR 64 | Resp 14 | Ht 64.5 in | Wt 158.4 lb

## 2020-01-18 DIAGNOSIS — M5136 Other intervertebral disc degeneration, lumbar region: Secondary | ICD-10-CM

## 2020-01-18 DIAGNOSIS — M7702 Medial epicondylitis, left elbow: Secondary | ICD-10-CM

## 2020-01-18 DIAGNOSIS — M19072 Primary osteoarthritis, left ankle and foot: Secondary | ICD-10-CM

## 2020-01-18 DIAGNOSIS — Z8639 Personal history of other endocrine, nutritional and metabolic disease: Secondary | ICD-10-CM

## 2020-01-18 DIAGNOSIS — Z13820 Encounter for screening for osteoporosis: Secondary | ICD-10-CM

## 2020-01-18 DIAGNOSIS — Z5181 Encounter for therapeutic drug level monitoring: Secondary | ICD-10-CM

## 2020-01-18 DIAGNOSIS — Z8679 Personal history of other diseases of the circulatory system: Secondary | ICD-10-CM

## 2020-01-18 DIAGNOSIS — M1A09X0 Idiopathic chronic gout, multiple sites, without tophus (tophi): Secondary | ICD-10-CM

## 2020-01-18 DIAGNOSIS — M170 Bilateral primary osteoarthritis of knee: Secondary | ICD-10-CM

## 2020-01-18 DIAGNOSIS — M19041 Primary osteoarthritis, right hand: Secondary | ICD-10-CM

## 2020-01-18 DIAGNOSIS — M7701 Medial epicondylitis, right elbow: Secondary | ICD-10-CM | POA: Diagnosis not present

## 2020-01-18 DIAGNOSIS — Z87442 Personal history of urinary calculi: Secondary | ICD-10-CM

## 2020-01-18 DIAGNOSIS — M19042 Primary osteoarthritis, left hand: Secondary | ICD-10-CM

## 2020-01-18 DIAGNOSIS — Z7189 Other specified counseling: Secondary | ICD-10-CM

## 2020-01-18 DIAGNOSIS — M7918 Myalgia, other site: Secondary | ICD-10-CM

## 2020-01-18 DIAGNOSIS — E785 Hyperlipidemia, unspecified: Secondary | ICD-10-CM

## 2020-01-18 DIAGNOSIS — M19071 Primary osteoarthritis, right ankle and foot: Secondary | ICD-10-CM

## 2020-01-18 NOTE — Patient Instructions (Signed)
Iliotibial Band Syndrome Rehab Ask your health care provider which exercises are safe for you. Do exercises exactly as told by your health care provider and adjust them as directed. It is normal to feel mild stretching, pulling, tightness, or discomfort as you do these exercises. Stop right away if you feel sudden pain or your pain gets significantly worse. Do not begin these exercises until told by your health care provider. Stretching and range-of-motion exercises These exercises warm up your muscles and joints and improve the movement and flexibility of your hip and pelvis. Quadriceps stretch, prone  1. Lie on your abdomen on a firm surface, such as a bed or padded floor (prone position). 2. Bend your left / right knee and reach back to hold your ankle or pant leg. If you cannot reach your ankle or pant leg, loop a belt around your foot and grab the belt instead. 3. Gently pull your heel toward your buttocks. Your knee should not slide out to the side. You should feel a stretch in the front of your thigh and knee (quadriceps). 4. Hold this position for __________ seconds. Repeat __________ times. Complete this exercise __________ times a day. Iliotibial band stretch An iliotibial band is a strong band of muscle tissue that runs from the outer side of your hip to the outer side of your thigh and knee. 1. Lie on your side with your left / right leg in the top position. 2. Bend both of your knees and grab your left / right ankle. Stretch out your bottom arm to help you balance. 3. Slowly bring your top knee back so your thigh goes behind your trunk. 4. Slowly lower your top leg toward the floor until you feel a gentle stretch on the outside of your left / right hip and thigh. If you do not feel a stretch and your knee will not fall farther, place the heel of your other foot on top of your knee and pull your knee down toward the floor with your foot. 5. Hold this position for __________  seconds. Repeat __________ times. Complete this exercise __________ times a day. Strengthening exercises These exercises build strength and endurance in your hip and pelvis. Endurance is the ability to use your muscles for a long time, even after they get tired. Straight leg raises, side-lying This exercise strengthens the muscles that rotate the leg at the hip and move it away from your body (hip abductors). 1. Lie on your side with your left / right leg in the top position. Lie so your head, shoulder, hip, and knee line up. You may bend your bottom knee to help you balance. 2. Roll your hips slightly forward so your hips are stacked directly over each other and your left / right knee is facing forward. 3. Tense the muscles in your outer thigh and lift your top leg 4-6 inches (10-15 cm). 4. Hold this position for __________ seconds. 5. Slowly return to the starting position. Let your muscles relax completely before doing another repetition. Repeat __________ times. Complete this exercise __________ times a day. Leg raises, prone This exercise strengthens the muscles that move the hips (hip extensors). 1. Lie on your abdomen on your bed or a firm surface. You can put a pillow under your hips if that is more comfortable for your lower back. 2. Bend your left / right knee so your foot is straight up in the air. 3. Squeeze your buttocks muscles and lift your left / right thigh   off the bed. Do not let your back arch. 4. Tense your thigh muscle as hard as you can without increasing any knee pain. 5. Hold this position for __________ seconds. 6. Slowly lower your leg to the starting position and allow it to relax completely. Repeat __________ times. Complete this exercise __________ times a day. Hip hike 1. Stand sideways on a bottom step. Stand on your left / right leg with your other foot unsupported next to the step. You can hold on to the railing or wall for balance if needed. 2. Keep your knees  straight and your torso square. Then lift your left / right hip up toward the ceiling. 3. Slowly let your left / right hip lower toward the floor, past the starting position. Your foot should get closer to the floor. Do not lean or bend your knees. Repeat __________ times. Complete this exercise __________ times a day. This information is not intended to replace advice given to you by your health care provider. Make sure you discuss any questions you have with your health care provider. Document Revised: 08/17/2018 Document Reviewed: 02/15/2018 Elsevier Patient Education  2020 [employer] Back Exercises The following exercises strengthen the muscles that help to support the trunk and back. They also help to keep the lower back flexible. Doing these exercises can help to prevent back pain or lessen existing pain.  If you have back pain or discomfort, try doing these exercises 2-3 times each day or as told by your health care provider.  As your pain improves, do them once each day, but increase the number of times that you repeat the steps for each exercise (do more repetitions).  To prevent the recurrence of back pain, continue to do these exercises once each day or as told by your health care provider. Do exercises exactly as told by your health care provider and adjust them as directed. It is normal to feel mild stretching, pulling, tightness, or discomfort as you do these exercises, but you should stop right away if you feel sudden pain or your pain gets worse. Exercises Single knee to chest Repeat these steps 3-5 times for each leg: 1. Lie on your back on a firm bed or the floor with your legs extended. 2. Bring one knee to your chest. Your other leg should stay extended and in contact with the floor. 3. Hold your knee in place by grabbing your knee or thigh with both hands and hold. 4. Pull on your knee until you feel a gentle stretch in your lower back or buttocks. 5. Hold the stretch  for 10-30 seconds. 6. Slowly release and straighten your leg. Pelvic tilt Repeat these steps 5-10 times: 1. Lie on your back on a firm bed or the floor with your legs extended. 2. Bend your knees so they are pointing toward the ceiling and your feet are flat on the floor. 3. Tighten your lower abdominal muscles to press your lower back against the floor. This motion will tilt your pelvis so your tailbone points up toward the ceiling instead of pointing to your feet or the floor. 4. With gentle tension and even breathing, hold this position for 5-10 seconds. Cat-cow Repeat these steps until your lower back becomes more flexible: 1. Get into a hands-and-knees position on a firm surface. Keep your hands under your shoulders, and keep your knees under your hips. You may place padding under your knees for comfort. 2. Let your head hang down toward your chest. Contract   your abdominal muscles and point your tailbone toward the floor so your lower back becomes rounded like the back of a cat. 3. Hold this position for 5 seconds. 4. Slowly lift your head, let your abdominal muscles relax and point your tailbone up toward the ceiling so your back forms a sagging arch like the back of a cow. 5. Hold this position for 5 seconds.  Press-ups Repeat these steps 5-10 times: 1. Lie on your abdomen (face-down) on the floor. 2. Place your palms near your head, about shoulder-width apart. 3. Keeping your back as relaxed as possible and keeping your hips on the floor, slowly straighten your arms to raise the top half of your body and lift your shoulders. Do not use your back muscles to raise your upper torso. You may adjust the placement of your hands to make yourself more comfortable. 4. Hold this position for 5 seconds while you keep your back relaxed. 5. Slowly return to lying flat on the floor.  Bridges Repeat these steps 10 times: 1. Lie on your back on a firm surface. 2. Bend your knees so they are  pointing toward the ceiling and your feet are flat on the floor. Your arms should be flat at your sides, next to your body. 3. Tighten your buttocks muscles and lift your buttocks off the floor until your waist is at almost the same height as your knees. You should feel the muscles working in your buttocks and the back of your thighs. If you do not feel these muscles, slide your feet 1-2 inches farther away from your buttocks. 4. Hold this position for 3-5 seconds. 5. Slowly lower your hips to the starting position, and allow your buttocks muscles to relax completely. If this exercise is too easy, try doing it with your arms crossed over your chest. Abdominal crunches Repeat these steps 5-10 times: 1. Lie on your back on a firm bed or the floor with your legs extended. 2. Bend your knees so they are pointing toward the ceiling and your feet are flat on the floor. 3. Cross your arms over your chest. 4. Tip your chin slightly toward your chest without bending your neck. 5. Tighten your abdominal muscles and slowly raise your trunk (torso) high enough to lift your shoulder blades a tiny bit off the floor. Avoid raising your torso higher than that because it can put too much stress on your low back and does not help to strengthen your abdominal muscles. 6. Slowly return to your starting position. Back lifts Repeat these steps 5-10 times: 1. Lie on your abdomen (face-down) with your arms at your sides, and rest your forehead on the floor. 2. Tighten the muscles in your legs and your buttocks. 3. Slowly lift your chest off the floor while you keep your hips pressed to the floor. Keep the back of your head in line with the curve in your back. Your eyes should be looking at the floor. 4. Hold this position for 3-5 seconds. 5. Slowly return to your starting position. Contact a health care provider if:  Your back pain or discomfort gets much worse when you do an exercise.  Your worsening back pain or  discomfort does not lessen within 2 hours after you exercise. If you have any of these problems, stop doing these exercises right away. Do not do them again unless your health care provider says that you can. Get help right away if:  You develop sudden, severe back pain. If this   happens, stop doing the exercises right away. Do not do them again unless your health care provider says that you can. This information is not intended to replace advice given to you by your health care provider. Make sure you discuss any questions you have with your health care provider. Document Revised: 08/31/2018 Document Reviewed: 01/26/2018 Elsevier Patient Education  2020 [employer]  

## 2020-01-19 LAB — CBC WITH DIFFERENTIAL/PLATELET
Absolute Monocytes: 535 cells/uL (ref 200–950)
Basophils Absolute: 40 cells/uL (ref 0–200)
Basophils Relative: 0.6 %
Eosinophils Absolute: 257 cells/uL (ref 15–500)
Eosinophils Relative: 3.9 %
HCT: 43.9 % (ref 35.0–45.0)
Hemoglobin: 13.6 g/dL (ref 11.7–15.5)
Lymphs Abs: 1274 cells/uL (ref 850–3900)
MCH: 27.6 pg (ref 27.0–33.0)
MCHC: 31 g/dL — ABNORMAL LOW (ref 32.0–36.0)
MCV: 89 fL (ref 80.0–100.0)
MPV: 10.5 fL (ref 7.5–12.5)
Monocytes Relative: 8.1 %
Neutro Abs: 4495 cells/uL (ref 1500–7800)
Neutrophils Relative %: 68.1 %
Platelets: 218 10*3/uL (ref 140–400)
RBC: 4.93 10*6/uL (ref 3.80–5.10)
RDW: 14.5 % (ref 11.0–15.0)
Total Lymphocyte: 19.3 %
WBC: 6.6 10*3/uL (ref 3.8–10.8)

## 2020-01-19 LAB — COMPLETE METABOLIC PANEL WITH GFR
AG Ratio: 1.8 (calc) (ref 1.0–2.5)
ALT: 30 U/L — ABNORMAL HIGH (ref 6–29)
AST: 23 U/L (ref 10–35)
Albumin: 4.5 g/dL (ref 3.6–5.1)
Alkaline phosphatase (APISO): 58 U/L (ref 37–153)
BUN/Creatinine Ratio: 24 (calc) — ABNORMAL HIGH (ref 6–22)
BUN: 28 mg/dL — ABNORMAL HIGH (ref 7–25)
CO2: 30 mmol/L (ref 20–32)
Calcium: 10 mg/dL (ref 8.6–10.4)
Chloride: 104 mmol/L (ref 98–110)
Creat: 1.16 mg/dL — ABNORMAL HIGH (ref 0.50–0.99)
GFR, Est African American: 58 mL/min/{1.73_m2} — ABNORMAL LOW (ref >=60)
GFR, Est Non African American: 50 mL/min/{1.73_m2} — ABNORMAL LOW (ref >=60)
Globulin: 2.5 g/dL (calc) (ref 1.9–3.7)
Glucose, Bld: 87 mg/dL (ref 65–99)
Potassium: 4.1 mmol/L (ref 3.5–5.3)
Sodium: 140 mmol/L (ref 135–146)
Total Bilirubin: 0.4 mg/dL (ref 0.2–1.2)
Total Protein: 7 g/dL (ref 6.1–8.1)

## 2020-01-19 LAB — URIC ACID: Uric Acid, Serum: 3.3 mg/dL (ref 2.5–7.0)

## 2020-01-20 NOTE — Progress Notes (Signed)
CBC is a stable.  GFR is lower.  ALT is mildly elevated.  Patient should discontinue use of meloxicam.

## 2020-03-19 ENCOUNTER — Encounter: Payer: Self-pay | Admitting: *Deleted

## 2020-03-21 ENCOUNTER — Ambulatory Visit: Payer: BC Managed Care – PPO | Admitting: Diagnostic Neuroimaging

## 2020-03-21 ENCOUNTER — Encounter: Payer: Self-pay | Admitting: Diagnostic Neuroimaging

## 2020-03-21 ENCOUNTER — Other Ambulatory Visit: Payer: Self-pay

## 2020-03-21 VITALS — BP 113/71 | HR 52 | Ht 64.5 in | Wt 160.0 lb

## 2020-03-21 DIAGNOSIS — M79604 Pain in right leg: Secondary | ICD-10-CM

## 2020-03-21 DIAGNOSIS — M25551 Pain in right hip: Secondary | ICD-10-CM | POA: Diagnosis not present

## 2020-03-21 DIAGNOSIS — G8929 Other chronic pain: Secondary | ICD-10-CM

## 2020-03-21 DIAGNOSIS — M48062 Spinal stenosis, lumbar region with neurogenic claudication: Secondary | ICD-10-CM

## 2020-03-21 DIAGNOSIS — M25561 Pain in right knee: Secondary | ICD-10-CM

## 2020-03-21 NOTE — Progress Notes (Signed)
[facility]  PATIENT: Jo Garcia DOB: 17-Mar-1956  REFERRING CLINICIAN: Earlie Server, MD HISTORY FROM: patient and husband REASON FOR VISIT: new consult    HISTORICAL  CHIEF COMPLAINT:  Chief Complaint  Patient presents with  . New Patient (Initial Visit)    low back pain/tightness, also extreme pain in lower right hip, thigh and right knee. Also skin sensitivity, tingling, muscle spasms. She and her husband walk 3 miles a day. She woke up one night with extreme pain in R hip/thigh. She saw orthopedics and had a cortisone shot. It helped a lot. However she states the MRI shows narrowing of the spinal column at the lower back that is probably pinching nerves.   . Referral    Earlie Server, MD  . Room 6    here with husband     HISTORY OF PRESENT ILLNESS:   64 year old female here for evaluation of right leg pain.  August 2021 patient had sudden onset of right thigh, right knee pain and muscle spasms.  His pain was very severe.  She went to orthopedic clinic for evaluation.  MRI of the right hip and lumbar spine were obtained.  She was found to have mild to moderate spinal stenosis and lumbar spine at L4-5.  No major problems with the right hip.  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  She had a right hip cortisone injection which seem to help.  Symptoms are gradually improved over time.  She still feels some residual bandlike sensation around her right knee.  She has some discomfort in her right upper calf and right patellar tendon region.    REVIEW OF SYSTEMS: Full 14 system review of systems performed and negative with exception of: As per HPI.  ALLERGIES: Allergies  Allergen Reactions  . Penicillins Hives  . Sulfa Antibiotics Hives  . Codeine Nausea And Vomiting    HOME MEDICATIONS: Outpatient Medications Prior to Visit  Medication Sig Dispense Refill  . acetaminophen (TYLENOL) 500 MG tablet Take 1,500 mg by mouth as  needed. Alternates with Ibuprofen    . allopurinol (ZYLOPRIM) 300 MG tablet Take 300 mg by mouth daily.    Marland Kitchen atorvastatin (LIPITOR) 10 MG tablet Take 10 mg by mouth daily.    Marland Kitchen CALCIUM PO Take by mouth.    . diclofenac sodium (VOLTAREN) 1 % GEL Apply topically 4 (four) times daily. Takes as needed.    . Ibuprofen 200 MG CAPS Take 400 mg by mouth as needed. Alternates with Tylenol    . levothyroxine (SYNTHROID, LEVOTHROID) 75 MCG tablet Take 75 mcg by mouth daily before breakfast.    . metoprolol succinate (TOPROL-XL) 50 MG 24 hr tablet Take 50 mg by mouth daily. Take with or immediately following a meal.    . quinapril (ACCUPRIL) 20 MG tablet Take 20 mg by mouth every other day.    . quinapril-hydrochlorothiazide (ACCURETIC) 20-12.5 MG per tablet Take 1 tablet by mouth every other day.     . methocarbamol (ROBAXIN) 500 MG tablet Take 500 mg by mouth every 6 (six) hours as needed. (Patient not taking: Reported on 03/21/2020)    . meloxicam (MOBIC) 15 MG tablet Take 15 mg by mouth 3 (three) times daily. (Patient not taking: Reported on 03/21/2020)     Facility-Administered Medications Prior to Visit  Medication Dose Route Frequency Provider Last Rate Last Admin  . 0.9 %  sodium chloride infusion  500 mL Intravenous Continuous Nandigam, Venia Minks, MD  PAST MEDICAL HISTORY: Past Medical History:  Diagnosis Date  . Anemia   . Chronic kidney disease    uric acid kidney stones  . GERD (gastroesophageal reflux disease)    past hx - uses ranitadine OTC prn only   . Gout   . Hyperlipidemia   . Hypertension   . Lumbar spine pain   . Neuromuscular disorder ([place])    hiatal hernia  . Osteoarthritis    thumbs, big toe left foot, top both feet  . PONV (postoperative nausea and vomiting)   . Thyroid disease     PAST SURGICAL HISTORY: Past Surgical History:  Procedure Laterality Date  . ABLATION    . COLONOSCOPY    . DILATION AND CURETTAGE OF UTERUS    . EYE SURGERY Bilateral 12/2017    eyelid surgery   . SHOULDER SURGERY     repair of tear     FAMILY HISTORY: Family History  Problem Relation Age of Onset  . Rheum arthritis Mother   . Tuberculosis Mother        "spinal tuberculosis"?  . Heart attack Father   . Colon cancer Cousin   . Rheum arthritis Daughter   . Hypertension Daughter   . Healthy Son   . Colon polyps Neg Hx   . Esophageal cancer Neg Hx   . Rectal cancer Neg Hx   . Stomach cancer Neg Hx     SOCIAL HISTORY: Social History   Socioeconomic History  . Marital status: Married    Spouse name: Event organiser  . Number of children: 2  . Years of education: Not on file  . Highest education level: Master's degree (e.g., MA, MS, MEng, MEd, MSW, MBA)  Occupational History  . Not on file  Tobacco Use  . Smoking status: Never Smoker  . Smokeless tobacco: Never Used  Vaping Use  . Vaping Use: Never used  Substance and Sexual Activity  . Alcohol use: Never  . Drug use: Never  . Sexual activity: Not on file  Other Topics Concern  . Not on file  Social History Narrative   Lives with husband   Right handed   Caffeine: occasionally tea or coke    Social Determinants of Health   Financial Resource Strain:   . Difficulty of Paying Living Expenses: Not on file  Food Insecurity:   . Worried About Charity fundraiser in the Last Year: Not on file  . Ran Out of Food in the Last Year: Not on file  Transportation Needs:   . Lack of Transportation (Medical): Not on file  . Lack of Transportation (Non-Medical): Not on file  Physical Activity:   . Days of Exercise per Week: Not on file  . Minutes of Exercise per Session: Not on file  Stress:   . Feeling of Stress : Not on file  Social Connections:   . Frequency of Communication with Friends and Family: Not on file  . Frequency of Social Gatherings with Friends and Family: Not on file  . Attends Religious Services: Not on file  . Active Member of Clubs or Organizations: Not on file  . Attends Theatre manager Meetings: Not on file  . Marital Status: Not on file  Intimate Partner Violence:   . Fear of Current or Ex-Partner: Not on file  . Emotionally Abused: Not on file  . Physically Abused: Not on file  . Sexually Abused: Not on file     PHYSICAL EXAM  GENERAL EXAM/CONSTITUTIONAL: Vitals:  Vitals:   03/21/20 0905  BP: 113/71  Pulse: (!) 52  Weight: 160 lb (72.6 kg)  Height: 5' 4.5" (1.638 m)     Body mass index is 27.04 kg/m. Wt Readings from Last 3 Encounters:  03/21/20 160 lb (72.6 kg)  01/18/20 158 lb 6.4 oz (71.8 kg)  07/20/19 169 lb (76.7 kg)     Patient is in no distress; well developed, nourished and groomed; neck is supple  CARDIOVASCULAR:  Examination of carotid arteries is normal; no carotid bruits  Regular rate and rhythm, no murmurs  Examination of peripheral vascular system by observation and palpation is normal  EYES:  Ophthalmoscopic exam of optic discs and posterior segments is normal; no papilledema or hemorrhages  No exam data present  MUSCULOSKELETAL:  Gait, strength, tone, movements noted in Neurologic exam below  NEUROLOGIC: MENTAL STATUS:  No flowsheet data found.  awake, alert, oriented to person, place and time  recent and remote memory intact  normal attention and concentration  language fluent, comprehension intact, naming intact  fund of knowledge appropriate  CRANIAL NERVE:   2nd - no papilledema on fundoscopic exam  2nd, 3rd, 4th, 6th - pupils equal and reactive to light, visual fields full to confrontation, extraocular muscles intact, no nystagmus  5th - facial sensation symmetric  7th - facial strength symmetric  8th - hearing intact  9th - palate elevates symmetrically, uvula midline  11th - shoulder shrug symmetric  12th - tongue protrusion midline  MOTOR:   normal bulk and tone, full strength in the BUE, BLE  SENSORY:   normal and symmetric to light touch, temperature,  vibration  COORDINATION:   finger-nose-finger, fine finger movements normal  REFLEXES:   deep tendon reflexes present and symmetric  GAIT/STATION:   narrow based gait     DIAGNOSTIC DATA (LABS, IMAGING, TESTING) - I reviewed patient records, labs, notes, testing and imaging myself where available.  Lab Results  Component Value Date   WBC 6.6 01/18/2020   HGB 13.6 01/18/2020   HCT 43.9 01/18/2020   MCV 89.0 01/18/2020   PLT 218 01/18/2020      Component Value Date/Time   NA 140 01/18/2020 1018   K 4.1 01/18/2020 1018   CL 104 01/18/2020 1018   CO2 30 01/18/2020 1018   GLUCOSE 87 01/18/2020 1018   BUN 28 (H) 01/18/2020 1018   CREATININE 1.16 (H) 01/18/2020 1018   CALCIUM 10.0 01/18/2020 1018   PROT 7.0 01/18/2020 1018   ALBUMIN 4.3 11/08/2016 1035   AST 23 01/18/2020 1018   ALT 30 (H) 01/18/2020 1018   ALKPHOS 63 11/08/2016 1035   BILITOT 0.4 01/18/2020 1018   GFRNONAA 50 (L) 01/18/2020 1018   GFRAA 58 (L) 01/18/2020 1018   No results found for: CHOL, HDL, LDLCALC, LDLDIRECT, TRIG, CHOLHDL No results found for: HGBA1C No results found for: VITAMINB12 No results found for: TSH   01/01/20 MRI right hip  1. No hip fracture, dislocation or avascular necrosis. 2. Mild tendinosis of the right gluteus minimus tendon insertion.  01/01/20 MRI lumbar spine [I reviewed images myself and agree with interpretation. -[facility]P]   L4-5: 3 mm anterolisthesis with severe facet degeneration. Diffuse disc bulging. Mild to moderate spinal stenosis. Moderate subarticular stenosis on the left. Impingement of the left L5 nerve root in the subarticular zone. Mild subarticular stenosis on the right   ASSESSMENT AND PLAN  63 y.o. year old female here with new onset of right leg pain in August 2021, with  chronic low back pain/tightness and right hip discomfort.  Symptoms are likely a combination of spinal stenosis in the lumbar spine, SI joint arthritis, muscle imbalance and weakness issues.   Symptoms improving gradually with PT exercises, rest and time.  No further neurologic testing or treatment recommended at this time.     Dx:  1. Right leg pain   2. Spinal stenosis of lumbar region with neurogenic claudication   3. Right hip pain   4. Chronic pain of right knee     PLAN:  - continue PT evaluation and treatments  Return for return to PCP.   I spent 30 minutes of face-to-face and non-face-to-face time with patient.  This included previsit chart review, imaging and lab review, electronic health record documentation, patient education.      Penni Bombard, MD 03/31/6243, 6:95 AM Certified in Neurology, Neurophysiology and Neuroimaging  [facility] [address], [place], [place] [address] [phone]

## 2020-07-04 NOTE — Progress Notes (Signed)
Office Visit Note  Patient: Jo Garcia             Date of Birth: 1956/03/31           MRN: [id number]             PCP: Haywood Pao, MD Referring: Haywood Pao, MD Visit Date: 07/18/2020 Occupation: @GUAROCC @  Subjective:  Back pain.   History of Present Illness: Jo Garcia is a 65 y.o. female with history of gout, osteoarthritis and degenerative disc disease.  She states she had lot of relief from physical therapy.  She still continues to have some right-sided radiculopathy off and on.  She states the right knee joint discomfort usually comes from her lower back.  She has incomplete fist formation due to underlying osteoarthritis.  She denies having any gout flare since her last visit.  She has been taking allopurinol on a regular basis.  Activities of Daily Living:  Patient reports morning stiffness for 24 hours.   Patient Reports nocturnal pain.  Difficulty dressing/grooming: Denies Difficulty climbing stairs: Reports Difficulty getting out of chair: Reports Difficulty using hands for taps, buttons, cutlery, and/or writing: Reports  Review of Systems  Constitutional: Negative for fatigue.  HENT: Negative for mouth sores, mouth dryness and nose dryness.   Eyes: Negative for pain, itching, visual disturbance and dryness.  Respiratory: Negative for cough, hemoptysis, shortness of breath and difficulty breathing.   Cardiovascular: Negative for chest pain, palpitations and swelling in legs/feet.  Gastrointestinal: Negative for abdominal pain, blood in stool, constipation and diarrhea.  Endocrine: Negative for increased urination.  Genitourinary: Negative for painful urination.  Musculoskeletal: Positive for arthralgias, joint pain, joint swelling, muscle weakness and morning stiffness. Negative for myalgias, muscle tenderness and myalgias.  Skin: Negative for color change, rash and redness.  Allergic/Immunologic: Negative for susceptible to infections.   Neurological: Negative for dizziness, numbness, headaches, memory loss and weakness.  Hematological: Negative for swollen glands.  Psychiatric/Behavioral: Negative for confusion and sleep disturbance.    PMFS History:  Patient Active Problem List   Diagnosis Date Noted  . Medial epicondylitis of both elbows 01/18/2020  . Dyslipidemia 11/04/2016  . Osteoarthritis of foot 04/30/2016  . History of hypertension 04/30/2016  . History of renal calculi 04/30/2016  . Vitamin D deficiency 04/30/2016  . History of hypothyroidism 04/30/2016  . Primary osteoarthritis of both hands 04/30/2016  . Primary osteoarthritis of both knees 04/30/2016  . Idiopathic chronic gout of multiple sites without tophus 04/30/2016    Past Medical History:  Diagnosis Date  . Anemia   . Chronic kidney disease    uric acid kidney stones  . GERD (gastroesophageal reflux disease)    past hx - uses ranitadine OTC prn only   . Gout   . Hyperlipidemia   . Hypertension   . Lumbar spine pain   . Neuromuscular disorder ([place])    hiatal hernia  . Osteoarthritis    thumbs, big toe left foot, top both feet  . PONV (postoperative nausea and vomiting)   . Thyroid disease     Family History  Problem Relation Age of Onset  . Rheum arthritis Mother   . Tuberculosis Mother        "spinal tuberculosis"?  . Heart attack Father   . Colon cancer Cousin   . Rheum arthritis Daughter   . Hypertension Daughter   . Healthy Son   . Colon polyps Neg Hx   . Esophageal cancer Neg Hx   .  Rectal cancer Neg Hx   . Stomach cancer Neg Hx    Past Surgical History:  Procedure Laterality Date  . ABLATION    . COLONOSCOPY    . DILATION AND CURETTAGE OF UTERUS    . EYE SURGERY Bilateral 12/2017   eyelid surgery   . SHOULDER SURGERY     repair of tear    Social History   Social History Narrative   Lives with husband   Right handed   Caffeine: occasionally tea or coke    Immunization History  Administered Date(s)  Administered  . PFIZER(Purple Top)SARS-COV-2 Vaccination 07/25/2019, 08/15/2019, 02/11/2020     Objective: Vital Signs: BP 121/76 (BP Location: Left Arm, Patient Position: Sitting, Cuff Size: Normal)   Pulse (!) 52   Ht 5' 4.25" (1.632 m)   Wt 166 lb 8 oz (75.5 kg)   BMI 28.36 kg/m    Physical Exam Vitals and nursing note reviewed.  Constitutional:      Appearance: She is well-developed.  HENT:     Head: Normocephalic and atraumatic.  Eyes:     Conjunctiva/sclera: Conjunctivae normal.  Cardiovascular:     Rate and Rhythm: Normal rate and regular rhythm.     Heart sounds: Normal heart sounds.  Pulmonary:     Effort: Pulmonary effort is normal.     Breath sounds: Normal breath sounds.  Abdominal:     General: Bowel sounds are normal.     Palpations: Abdomen is soft.  Musculoskeletal:     Cervical back: Normal range of motion.  Lymphadenopathy:     Cervical: No cervical adenopathy.  Skin:    General: Skin is warm and dry.     Capillary Refill: Capillary refill takes less than 2 seconds.  Neurological:     Mental Status: She is alert and oriented to person, place, and time.  Psychiatric:        Behavior: Behavior normal.      Musculoskeletal Exam: C-spine was in good range of motion.  Shoulder joints, elbow joints, wrist joints with good range of motion.  She has incomplete fist formation with PIP and DIP thickening.  She also had tenderness over CMC joints.  Hip joints and knee joints with good range of motion.  She had no tenderness over ankles or MTPs.  CDAI Exam: CDAI Score: - Patient Global: -; Provider Global: - Swollen: -; Tender: - Joint Exam 07/18/2020   No joint exam has been documented for this visit   There is currently no information documented on the homunculus. Go to the Rheumatology activity and complete the homunculus joint exam.  Investigation: No additional findings.  Imaging: No results found.  Recent Labs: Lab Results  Component Value  Date   WBC 6.6 01/18/2020   HGB 13.6 01/18/2020   PLT 218 01/18/2020   NA 140 01/18/2020   K 4.1 01/18/2020   CL 104 01/18/2020   CO2 30 01/18/2020   GLUCOSE 87 01/18/2020   BUN 28 (H) 01/18/2020   CREATININE 1.16 (H) 01/18/2020   BILITOT 0.4 01/18/2020   ALKPHOS 63 11/08/2016   AST 23 01/18/2020   ALT 30 (H) 01/18/2020   PROT 7.0 01/18/2020   ALBUMIN 4.3 11/08/2016   CALCIUM 10.0 01/18/2020   GFRAA 58 (L) 01/18/2020    Speciality Comments: No specialty comments available.  Procedures:  No procedures performed Allergies: Penicillins, Sulfa antibiotics, and Codeine   Assessment / Plan:     Visit Diagnoses: Idiopathic chronic gout of multiple sites without tophus -  Allopurinol 300 mg daily. uric acid: 01/18/2020 3.3.  She denies having any gout flare since her last visit.  I will check uric acid level today.  Medication monitoring encounter-we will check CBC and CMP with GFR today.  Primary osteoarthritis of both hands-she has incomplete fist formation bilaterally.  A handout on hand exercises was given.  She has been having right CMC discomfort.  Right CMC brace was given.  Joint protection was discussed.  Primary osteoarthritis of both knees-she continues to have some discomfort in her knee joints.  No warmth swelling effusion was noted.  Primary osteoarthritis of both feet-she has modified her shoes which has been helpful.  DDD (degenerative disc disease), lumbar - L4-5 spondylolisthesis, mild to moderate spinal stenosis at L4-5 and facet joint arthropathy.  She had good response to physical therapy.  She still has intermittent right-sided radiculopathy.  Gluteal pain - MRI was done by Dr. Tomi Likens. MRI showed right SI joint to sclerosis and right gluteus minimus tendinopathy.  He states the discomfort has improved.  History of vitamin D deficiency  History of hypertension-her blood pressure is normal today.  History of renal calculi  Dyslipidemia  History of  hypothyroidism  Osteoporosis screening - Patient reports that she gets DEXA scan with her PCP which has been normal.  She is fully vaccinated against COVID-19 and also received a booster.  Use of mask, social distancing and hand hygiene was emphasized.  Orders: No orders of the defined types were placed in this encounter.  No orders of the defined types were placed in this encounter.    Follow-Up Instructions: Return for Osteoarthritis.   Bo Merino, MD  Note - This record has been created using Editor, commissioning.  Chart creation errors have been sought, but may not always  have been located. Such creation errors do not reflect on  the standard of medical care.

## 2020-07-18 ENCOUNTER — Encounter: Payer: Self-pay | Admitting: Rheumatology

## 2020-07-18 ENCOUNTER — Ambulatory Visit: Payer: BC Managed Care – PPO | Admitting: Rheumatology

## 2020-07-18 ENCOUNTER — Other Ambulatory Visit: Payer: Self-pay

## 2020-07-18 VITALS — BP 121/76 | HR 52 | Ht 64.25 in | Wt 166.5 lb

## 2020-07-18 DIAGNOSIS — Z8639 Personal history of other endocrine, nutritional and metabolic disease: Secondary | ICD-10-CM

## 2020-07-18 DIAGNOSIS — Z13820 Encounter for screening for osteoporosis: Secondary | ICD-10-CM

## 2020-07-18 DIAGNOSIS — Z5181 Encounter for therapeutic drug level monitoring: Secondary | ICD-10-CM

## 2020-07-18 DIAGNOSIS — M19072 Primary osteoarthritis, left ankle and foot: Secondary | ICD-10-CM

## 2020-07-18 DIAGNOSIS — Z87442 Personal history of urinary calculi: Secondary | ICD-10-CM

## 2020-07-18 DIAGNOSIS — M19071 Primary osteoarthritis, right ankle and foot: Secondary | ICD-10-CM

## 2020-07-18 DIAGNOSIS — M170 Bilateral primary osteoarthritis of knee: Secondary | ICD-10-CM | POA: Diagnosis not present

## 2020-07-18 DIAGNOSIS — M7918 Myalgia, other site: Secondary | ICD-10-CM

## 2020-07-18 DIAGNOSIS — M19041 Primary osteoarthritis, right hand: Secondary | ICD-10-CM

## 2020-07-18 DIAGNOSIS — Z8679 Personal history of other diseases of the circulatory system: Secondary | ICD-10-CM

## 2020-07-18 DIAGNOSIS — E785 Hyperlipidemia, unspecified: Secondary | ICD-10-CM

## 2020-07-18 DIAGNOSIS — M1A09X0 Idiopathic chronic gout, multiple sites, without tophus (tophi): Secondary | ICD-10-CM

## 2020-07-18 DIAGNOSIS — M19042 Primary osteoarthritis, left hand: Secondary | ICD-10-CM

## 2020-07-18 DIAGNOSIS — M5136 Other intervertebral disc degeneration, lumbar region: Secondary | ICD-10-CM

## 2020-07-18 NOTE — Patient Instructions (Signed)
Hand Exercises Hand exercises can be helpful for almost anyone. These exercises can strengthen the hands, improve flexibility and movement, and increase blood flow to the hands. These results can make work and daily tasks easier. Hand exercises can be especially helpful for people who have joint pain from arthritis or have nerve damage from overuse (carpal tunnel syndrome). These exercises can also help people who have injured a hand. Exercises Most of these hand exercises are gentle stretching and motion exercises. It is usually safe to do them often throughout the day. Warming up your hands before exercise may help to reduce stiffness. You can do this with gentle massage or by placing your hands in warm water for 10-15 minutes. It is normal to feel some stretching, pulling, tightness, or mild discomfort as you begin new exercises. This will gradually improve. Stop an exercise right away if you feel sudden, severe pain or your pain gets worse. Ask your health care provider which exercises are best for you. Knuckle bend or "claw" fist 1. Stand or sit with your arm, hand, and all five fingers pointed straight up. Make sure to keep your wrist straight during the exercise. 2. Gently bend your fingers down toward your palm until the tips of your fingers are touching the top of your palm. Keep your big knuckle straight and just bend the small knuckles in your fingers. 3. Hold this position for __________ seconds. 4. Straighten (extend) your fingers back to the starting position. Repeat this exercise 5-10 times with each hand. Full finger fist 1. Stand or sit with your arm, hand, and all five fingers pointed straight up. Make sure to keep your wrist straight during the exercise. 2. Gently bend your fingers into your palm until the tips of your fingers are touching the middle of your palm. 3. Hold this position for __________ seconds. 4. Extend your fingers back to the starting position, stretching every  joint fully. Repeat this exercise 5-10 times with each hand. Straight fist 1. Stand or sit with your arm, hand, and all five fingers pointed straight up. Make sure to keep your wrist straight during the exercise. 2. Gently bend your fingers at the big knuckle, where your fingers meet your hand, and the middle knuckle. Keep the knuckle at the tips of your fingers straight and try to touch the bottom of your palm. 3. Hold this position for __________ seconds. 4. Extend your fingers back to the starting position, stretching every joint fully. Repeat this exercise 5-10 times with each hand. Tabletop 1. Stand or sit with your arm, hand, and all five fingers pointed straight up. Make sure to keep your wrist straight during the exercise. 2. Gently bend your fingers at the big knuckle, where your fingers meet your hand, as far down as you can while keeping the small knuckles in your fingers straight. Think of forming a tabletop with your fingers. 3. Hold this position for __________ seconds. 4. Extend your fingers back to the starting position, stretching every joint fully. Repeat this exercise 5-10 times with each hand. Finger spread 1. Place your hand flat on a table with your palm facing down. Make sure your wrist stays straight as you do this exercise. 2. Spread your fingers and thumb apart from each other as far as you can until you feel a gentle stretch. Hold this position for __________ seconds. 3. Bring your fingers and thumb tight together again. Hold this position for __________ seconds. Repeat this exercise 5-10 times with each hand.   Making circles 1. Stand or sit with your arm, hand, and all five fingers pointed straight up. Make sure to keep your wrist straight during the exercise. 2. Make a circle by touching the tip of your thumb to the tip of your index finger. 3. Hold for __________ seconds. Then open your hand wide. 4. Repeat this motion with your thumb and each finger on your  hand. Repeat this exercise 5-10 times with each hand. Thumb motion 1. Sit with your forearm resting on a table and your wrist straight. Your thumb should be facing up toward the ceiling. Keep your fingers relaxed as you move your thumb. 2. Lift your thumb up as high as you can toward the ceiling. Hold for __________ seconds. 3. Bend your thumb across your palm as far as you can, reaching the tip of your thumb for the small finger (pinkie) side of your palm. Hold for __________ seconds. Repeat this exercise 5-10 times with each hand. Grip strengthening 1. Hold a stress ball or other soft ball in the middle of your hand. 2. Slowly increase the pressure, squeezing the ball as much as you can without causing pain. Think of bringing the tips of your fingers into the middle of your palm. All of your finger joints should bend when doing this exercise. 3. Hold your squeeze for __________ seconds, then relax. Repeat this exercise 5-10 times with each hand.   Contact a health care provider if:  Your hand pain or discomfort gets much worse when you do an exercise.  Your hand pain or discomfort does not improve within 2 hours after you exercise. If you have any of these problems, stop doing these exercises right away. Do not do them again unless your health care provider says that you can. Get help right away if:  You develop sudden, severe hand pain or swelling. If this happens, stop doing these exercises right away. Do not do them again unless your health care provider says that you can. This information is not intended to replace advice given to you by your health care provider. Make sure you discuss any questions you have with your health care provider. Document Revised: 08/17/2018 Document Reviewed: 04/27/2018 [employer]vier Patient Education  2021 [employer]  

## 2020-07-19 LAB — CBC WITH DIFFERENTIAL/PLATELET
Absolute Monocytes: 470 cells/uL (ref 200–950)
Basophils Absolute: 50 cells/uL (ref 0–200)
Basophils Relative: 0.9 %
Eosinophils Absolute: 543 cells/uL — ABNORMAL HIGH (ref 15–500)
Eosinophils Relative: 9.7 %
HCT: 37.2 % (ref 35.0–45.0)
Hemoglobin: 11.5 g/dL — ABNORMAL LOW (ref 11.7–15.5)
Lymphs Abs: 1422 cells/uL (ref 850–3900)
MCH: 24.9 pg — ABNORMAL LOW (ref 27.0–33.0)
MCHC: 30.9 g/dL — ABNORMAL LOW (ref 32.0–36.0)
MCV: 80.5 fL (ref 80.0–100.0)
MPV: 10.2 fL (ref 7.5–12.5)
Monocytes Relative: 8.4 %
Neutro Abs: 3114 cells/uL (ref 1500–7800)
Neutrophils Relative %: 55.6 %
Platelets: 179 10*3/uL (ref 140–400)
RBC: 4.62 10*6/uL (ref 3.80–5.10)
RDW: 15.2 % — ABNORMAL HIGH (ref 11.0–15.0)
Total Lymphocyte: 25.4 %
WBC: 5.6 10*3/uL (ref 3.8–10.8)

## 2020-07-19 LAB — COMPLETE METABOLIC PANEL WITH GFR
AG Ratio: 1.8 (calc) (ref 1.0–2.5)
ALT: 19 U/L (ref 6–29)
AST: 21 U/L (ref 10–35)
Albumin: 4.3 g/dL (ref 3.6–5.1)
Alkaline phosphatase (APISO): 63 U/L (ref 37–153)
BUN/Creatinine Ratio: 17 (calc) (ref 6–22)
BUN: 22 mg/dL (ref 7–25)
CO2: 30 mmol/L (ref 20–32)
Calcium: 9.9 mg/dL (ref 8.6–10.4)
Chloride: 105 mmol/L (ref 98–110)
Creat: 1.26 mg/dL — ABNORMAL HIGH (ref 0.50–0.99)
GFR, Est African American: 52 mL/min/{1.73_m2} — ABNORMAL LOW (ref >=60)
GFR, Est Non African American: 45 mL/min/{1.73_m2} — ABNORMAL LOW (ref >=60)
Globulin: 2.4 g/dL (calc) (ref 1.9–3.7)
Glucose, Bld: 91 mg/dL (ref 65–99)
Potassium: 4.3 mmol/L (ref 3.5–5.3)
Sodium: 142 mmol/L (ref 135–146)
Total Bilirubin: 0.4 mg/dL (ref 0.2–1.2)
Total Protein: 6.7 g/dL (ref 6.1–8.1)

## 2020-07-19 LAB — URIC ACID: Uric Acid, Serum: 3.4 mg/dL (ref 2.5–7.0)

## 2020-07-20 NOTE — Progress Notes (Signed)
Anemia noted.  Please advise patient to take multivitamin with iron.  GFR (kidney function) is low.  Most likely due to the use of diuretics and ACE inhibitor.  Uric acid is in desirable range.  Patient may discuss decrease GFR with Dr. Osborne Casco.

## 2021-01-02 NOTE — Progress Notes (Signed)
Office Visit Note  Patient: Jo Garcia             Date of Birth: 1955-09-14           MRN: [id number]             PCP: Haywood Pao, MD Referring: Haywood Pao, MD Visit Date: 01/16/2021 Occupation: '@GUAROCC'$ @  Subjective:  No chief complaint on file.   History of Present Illness: Jo Garcia is a 65 y.o. female with a history of osteoarthritis and gout.  She denies having any gout flare.  She states she had surgery on her right shoulder in the past.  She has been experiencing some crepitus in her right shoulder.  She also has some stiffness in her hands and her knee joints.  She states her knee joints constantly hurt.  She has some discomfort in her feet.  She has bought some new shoes which has been very comfortable for her.  She denies any history of joint swelling.  Activities of Daily Living:  Patient reports morning stiffness for several  hours.   Patient Denies nocturnal pain.  Difficulty dressing/grooming: Denies Difficulty climbing stairs: Reports Difficulty getting out of chair: Reports Difficulty using hands for taps, buttons, cutlery, and/or writing: Reports  Review of Systems  Constitutional:  Negative for fatigue.  HENT:  Negative for mouth sores, mouth dryness and nose dryness.   Eyes:  Negative for pain, itching and dryness.  Respiratory:  Negative for shortness of breath and difficulty breathing.   Cardiovascular:  Negative for chest pain and palpitations.  Gastrointestinal:  Negative for blood in stool, constipation and diarrhea.  Endocrine: Negative for increased urination.  Genitourinary:  Negative for difficulty urinating.  Musculoskeletal:  Positive for joint pain, joint pain, myalgias, morning stiffness, muscle tenderness and myalgias. Negative for joint swelling.  Skin:  Negative for color change, rash and redness.  Allergic/Immunologic: Negative for susceptible to infections.  Neurological:  Positive for numbness. Negative for  dizziness, headaches, memory loss and weakness.  Hematological:  Negative for bruising/bleeding tendency.  Psychiatric/Behavioral:  Negative for confusion.    PMFS History:  Patient Active Problem List   Diagnosis Date Noted   Medial epicondylitis of both elbows 01/18/2020   Dyslipidemia 11/04/2016   Osteoarthritis of foot 04/30/2016   History of hypertension 04/30/2016   History of renal calculi 04/30/2016   Vitamin D deficiency 04/30/2016   History of hypothyroidism 04/30/2016   Primary osteoarthritis of both hands 04/30/2016   Primary osteoarthritis of both knees 04/30/2016   Idiopathic chronic gout of multiple sites without tophus 04/30/2016    Past Medical History:  Diagnosis Date   Anemia    Chronic kidney disease    uric acid kidney stones   GERD (gastroesophageal reflux disease)    past hx - uses ranitadine OTC prn only    Gout    Hyperlipidemia    Hypertension    Lumbar spine pain    Neuromuscular disorder (HCC)    hiatal hernia   Osteoarthritis    thumbs, big toe left foot, top both feet   PONV (postoperative nausea and vomiting)    Thyroid disease     Family History  Problem Relation Age of Onset   Rheum arthritis Mother    Tuberculosis Mother        "spinal tuberculosis"?   Heart attack Father    Colon cancer Cousin    Rheum arthritis Daughter    Hypertension Daughter    Healthy  Son    Colon polyps Neg Hx    Esophageal cancer Neg Hx    Rectal cancer Neg Hx    Stomach cancer Neg Hx    Past Surgical History:  Procedure Laterality Date   ABLATION     COLONOSCOPY     DILATION AND CURETTAGE OF UTERUS     EYE SURGERY Bilateral 12/2017   eyelid surgery    SHOULDER SURGERY     repair of tear    Social History   Social History Narrative   Lives with husband   Right handed   Caffeine: occasionally tea or coke    Immunization History  Administered Date(s) Administered   PFIZER(Purple Top)SARS-COV-2 Vaccination 07/25/2019, 08/15/2019, 02/11/2020      Objective: Vital Signs: BP 111/74 (BP Location: Left Arm, Patient Position: Sitting, Cuff Size: Normal)   Pulse (!) 48   Resp 16   Ht '5\' 4"'$  (1.626 m)   Wt 165 lb 12.8 oz (75.2 kg)   BMI 28.46 kg/m    Physical Exam Vitals and nursing note reviewed.  Constitutional:      Appearance: She is well-developed.  HENT:     Head: Normocephalic and atraumatic.  Eyes:     Conjunctiva/sclera: Conjunctivae normal.  Cardiovascular:     Rate and Rhythm: Normal rate and regular rhythm.     Heart sounds: Normal heart sounds.  Pulmonary:     Effort: Pulmonary effort is normal.     Breath sounds: Normal breath sounds.  Abdominal:     General: Bowel sounds are normal.     Palpations: Abdomen is soft.  Musculoskeletal:     Cervical back: Normal range of motion.  Lymphadenopathy:     Cervical: No cervical adenopathy.  Skin:    General: Skin is warm and dry.     Capillary Refill: Capillary refill takes less than 2 seconds.  Neurological:     Mental Status: She is alert and oriented to person, place, and time.  Psychiatric:        Behavior: Behavior normal.     Musculoskeletal Exam: C-spine was in good range of motion.  She had crepitus with range of motion of her right shoulder joint over the anterior aspect.  Elbow joints, wrist joints with good range of motion.  She had bilateral PIP and DIP thickening and bilateral CMC thickening with right CMC tenderness.  Hip joints and knee joints with good range of motion without any warmth swelling or effusion.  She had discomfort range of motion of bilateral knee joints.  There was no tenderness over ankles or MTPs.  CDAI Exam: CDAI Score: -- Patient Global: --; Provider Global: -- Swollen: --; Tender: -- Joint Exam 01/16/2021   No joint exam has been documented for this visit   There is currently no information documented on the homunculus. Go to the Rheumatology activity and complete the homunculus joint exam.  Investigation: No  additional findings.  Imaging: No results found.  Recent Labs: Lab Results  Component Value Date   WBC 5.6 07/18/2020   HGB 11.5 (L) 07/18/2020   PLT 179 07/18/2020   NA 142 07/18/2020   K 4.3 07/18/2020   CL 105 07/18/2020   CO2 30 07/18/2020   GLUCOSE 91 07/18/2020   BUN 22 07/18/2020   CREATININE 1.26 (H) 07/18/2020   BILITOT 0.4 07/18/2020   ALKPHOS 63 11/08/2016   AST 21 07/18/2020   ALT 19 07/18/2020   PROT 6.7 07/18/2020   ALBUMIN 4.3 11/08/2016  CALCIUM 9.9 07/18/2020   GFRAA 52 (L) 07/18/2020    Speciality Comments: No specialty comments available.  Procedures:  No procedures performed Allergies: Penicillins, Sulfa antibiotics, and Codeine   Assessment / Plan:     Visit Diagnoses: Idiopathic chronic gout of multiple sites without tophus - Allopurinol 300 mg daily. uric acid: 07/18/2020 3.4.  Patient denies having any gout flares.  Her uric acid is in desirable range.  Medication monitoring encounter  Primary osteoarthritis of both hands-he complains of pain and discomfort in the bilateral hands.  Joint protection muscle strengthening was discussed.  A handout on hand exercises was given.  Primary osteoarthritis of both knees -she complains of pain and discomfort in her bilateral knee joints.  No warmth swelling or effusion was given.  Handout on lower extremity exercises was given.  Plan: XR KNEE 3 VIEW RIGHT, XR KNEE 3 VIEW LEFT.  X-ray showed bilateral moderate chondromalacia patella.  X-ray findings were discussed with the patient.  Quad strengthening exercises were discussed.  Primary osteoarthritis of both feet-proper fitting shoes were discussed.  DDD (degenerative disc disease), lumbar - L4-5 spondylolisthesis, mild to moderate spinal stenosis at L4-5 and facet joint arthropathy.  Her lower back pain is improved since she has been going to Pilates classes.  I gave her a handout on core strengthening exercises and lower back exercises.  History of  hypertension  Dyslipidemia  History of renal calculi  History of vitamin D deficiency  History of hypothyroidism  Osteoporosis screening - Patient reports that she gets DEXA scan with her PCP which has been normal.  Orders: Orders Placed This Encounter  Procedures   XR KNEE 3 VIEW RIGHT   XR KNEE 3 VIEW LEFT    No orders of the defined types were placed in this encounter.  Total time spent with the patient was 35 minutes.  More than 50% time was spent in counseling and coordination of care.  Follow-Up Instructions: Return in about 6 months (around 07/16/2021) for Osteoarthritis.   Bo Merino, MD  Note - This record has been created using Editor, commissioning.  Chart creation errors have been sought, but may not always  have been located. Such creation errors do not reflect on  the standard of medical care.

## 2021-01-16 ENCOUNTER — Ambulatory Visit: Payer: Self-pay

## 2021-01-16 ENCOUNTER — Ambulatory Visit: Payer: Medicare PPO | Admitting: Rheumatology

## 2021-01-16 ENCOUNTER — Encounter: Payer: Self-pay | Admitting: Rheumatology

## 2021-01-16 ENCOUNTER — Other Ambulatory Visit: Payer: Self-pay

## 2021-01-16 VITALS — BP 111/74 | HR 48 | Resp 16 | Ht 64.0 in | Wt 165.8 lb

## 2021-01-16 DIAGNOSIS — M1711 Unilateral primary osteoarthritis, right knee: Secondary | ICD-10-CM

## 2021-01-16 DIAGNOSIS — E785 Hyperlipidemia, unspecified: Secondary | ICD-10-CM

## 2021-01-16 DIAGNOSIS — M170 Bilateral primary osteoarthritis of knee: Secondary | ICD-10-CM

## 2021-01-16 DIAGNOSIS — Z13820 Encounter for screening for osteoporosis: Secondary | ICD-10-CM

## 2021-01-16 DIAGNOSIS — M5136 Other intervertebral disc degeneration, lumbar region: Secondary | ICD-10-CM

## 2021-01-16 DIAGNOSIS — M19071 Primary osteoarthritis, right ankle and foot: Secondary | ICD-10-CM

## 2021-01-16 DIAGNOSIS — M51369 Other intervertebral disc degeneration, lumbar region without mention of lumbar back pain or lower extremity pain: Secondary | ICD-10-CM

## 2021-01-16 DIAGNOSIS — M19041 Primary osteoarthritis, right hand: Secondary | ICD-10-CM | POA: Diagnosis not present

## 2021-01-16 DIAGNOSIS — M1A09X0 Idiopathic chronic gout, multiple sites, without tophus (tophi): Secondary | ICD-10-CM

## 2021-01-16 DIAGNOSIS — M1712 Unilateral primary osteoarthritis, left knee: Secondary | ICD-10-CM

## 2021-01-16 DIAGNOSIS — Z8679 Personal history of other diseases of the circulatory system: Secondary | ICD-10-CM

## 2021-01-16 DIAGNOSIS — Z8639 Personal history of other endocrine, nutritional and metabolic disease: Secondary | ICD-10-CM

## 2021-01-16 DIAGNOSIS — Z5181 Encounter for therapeutic drug level monitoring: Secondary | ICD-10-CM | POA: Diagnosis not present

## 2021-01-16 DIAGNOSIS — Z87442 Personal history of urinary calculi: Secondary | ICD-10-CM

## 2021-01-16 DIAGNOSIS — M19072 Primary osteoarthritis, left ankle and foot: Secondary | ICD-10-CM

## 2021-01-16 DIAGNOSIS — M19042 Primary osteoarthritis, left hand: Secondary | ICD-10-CM

## 2021-01-16 NOTE — Patient Instructions (Addendum)
Back Exercises The following exercises strengthen the muscles that help to support the trunk (torso) and back. They also help to keep the lower back flexible. Doing these exercises can help to prevent or lessen existing low back pain. If you have back pain or discomfort, try doing these exercises 2-3 times each day or as told by your health care provider. As your pain improves, do them once each day, but increase the number of times that you repeat the steps for each exercise (do more repetitions). To prevent the recurrence of back pain, continue to do these exercises once each day or as told by your health care provider. Do exercises exactly as told by your health care provider and adjust them as directed. It is normal to feel mild stretching, pulling, tightness, or discomfort as you do these exercises, but you should stop right away if you feel sudden pain or your pain gets worse. Exercises Single knee to chest Repeat these steps 3-5 times for each leg: Lie on your back on a firm bed or the floor with your legs extended. Bring one knee to your chest. Your other leg should stay extended and in contact with the floor. Hold your knee in place by grabbing your knee or thigh with both hands and hold. Pull on your knee until you feel a gentle stretch in your lower back or buttocks. Hold the stretch for 10-30 seconds. Slowly release and straighten your leg. Pelvic tilt Repeat these steps 5-10 times: Lie on your back on a firm bed or the floor with your legs extended. Bend your knees so they are pointing toward the ceiling and your feet are flat on the floor. Tighten your lower abdominal muscles to press your lower back against the floor. This motion will tilt your pelvis so your tailbone points up toward the ceiling instead of pointing to your feet or the floor. With gentle tension and even breathing, hold this position for 5-10 seconds. Cat-cow Repeat these steps until your lower back becomes more  flexible: Get into a hands-and-knees position on a firm bed or the floor. Keep your hands under your shoulders, and keep your knees under your hips. You may place padding under your knees for comfort. Let your head hang down toward your chest. Contract your abdominal muscles and point your tailbone toward the floor so your lower back becomes rounded like the back of a cat. Hold this position for 5 seconds. Slowly lift your head, let your abdominal muscles relax, and point your tailbone up toward the ceiling so your back forms a sagging arch like the back of a cow. Hold this position for 5 seconds.  Press-ups Repeat these steps 5-10 times: Lie on your abdomen (face-down) on a firm bed or the floor. Place your palms near your head, about shoulder-width apart. Keeping your back as relaxed as possible and keeping your hips on the floor, slowly straighten your arms to raise the top half of your body and lift your shoulders. Do not use your back muscles to raise your upper torso. You may adjust the placement of your hands to make yourself more comfortable. Hold this position for 5 seconds while you keep your back relaxed. Slowly return to lying flat on the floor.  Bridges Repeat these steps 10 times: Lie on your back on a firm bed or the floor. Bend your knees so they are pointing toward the ceiling and your feet are flat on the floor. Your arms should be flat at your   sides, next to your body. Tighten your buttocks muscles and lift your buttocks off the floor until your waist is at almost the same height as your knees. You should feel the muscles working in your buttocks and the back of your thighs. If you do not feel these muscles, slide your feet 1-2 inches (2.5-5 cm) farther away from your buttocks. Hold this position for 3-5 seconds. Slowly lower your hips to the starting position, and allow your buttocks muscles to relax completely. If this exercise is too easy, try doing it with your arms  crossed over your chest. Abdominal crunches Repeat these steps 5-10 times: Lie on your back on a firm bed or the floor with your legs extended. Bend your knees so they are pointing toward the ceiling and your feet are flat on the floor. Cross your arms over your chest. Tip your chin slightly toward your chest without bending your neck. Tighten your abdominal muscles and slowly raise your torso high enough to lift your shoulder blades a tiny bit off the floor. Avoid raising your torso higher than that because it can put too much stress on your lower back and does not help to strengthen your abdominal muscles. Slowly return to your starting position. Back lifts Repeat these steps 5-10 times: Lie on your abdomen (face-down) with your arms at your sides, and rest your forehead on the floor. Tighten the muscles in your legs and your buttocks. Slowly lift your chest off the floor while you keep your hips pressed to the floor. Keep the back of your head in line with the curve in your back. Your eyes should be looking at the floor. Hold this position for 3-5 seconds. Slowly return to your starting position. Contact a health care provider if: Your back pain or discomfort gets much worse when you do an exercise. Your worsening back pain or discomfort does not lessen within 2 hours after you exercise. If you have any of these problems, stop doing these exercises right away. Do not do them again unless your health care provider says that you can. Get help right away if: You develop sudden, severe back pain. If this happens, stop doing the exercises right away. Do not do them again unless your health care provider says that you can. This information is not intended to replace advice given to you by your health care provider. Make sure you discuss any questions you have with your health care provider. Document Revised: 07/09/2020 Document Reviewed: 07/09/2020 [employer] Patient Education  [place]. Knee Exercises Ask your health care provider which exercises are safe for you. Do exercises exactly as told by your health care provider and adjust them as directed. It is normal to feel mild stretching, pulling, tightness, or discomfort as you do these exercises. Stop right away if you feel sudden pain or your pain gets worse. Do not begin these exercises until told by your health care provider. Stretching and range-of-motion exercises These exercises warm up your muscles and joints and improve the movement and flexibility of your knee. These exercises also help to relieve pain and swelling. Knee extension, prone Lie on your abdomen (prone position) on a bed. Place your left / right knee just beyond the edge of the surface so your knee is not on the bed. You can put a towel under your left / right thigh just above your kneecap for comfort. Relax your leg muscles and allow gravity to straighten your knee (extension). You should feel a  stretch behind your left / right knee. Hold this position for __________ seconds. Scoot up so your knee is supported between repetitions. Repeat __________ times. Complete this exercise __________ times a day. Knee flexion, active  Lie on your back with both legs straight. If this causes back discomfort, bend your left / right knee so your foot is flat on the floor. Slowly slide your left / right heel back toward your buttocks. Stop when you feel a gentle stretch in the front of your knee or thigh (flexion). Hold this position for __________ seconds. Slowly slide your left / right heel back to the starting position. Repeat __________ times. Complete this exercise __________ times a day. Quadriceps stretch, prone  Lie on your abdomen on a firm surface, such as a bed or padded floor. Bend your left / right knee and hold your ankle. If you cannot reach your ankle or pant leg, loop a belt around your foot and grab the belt instead. Gently pull your heel toward  your buttocks. Your knee should not slide out to the side. You should feel a stretch in the front of your thigh and knee (quadriceps). Hold this position for __________ seconds. Repeat __________ times. Complete this exercise __________ times a day. Hamstring, supine Lie on your back (supine position). Loop a belt or towel over the ball of your left / right foot. The ball of your foot is on the walking surface, right under your toes. Straighten your left / right knee and slowly pull on the belt to raise your leg until you feel a gentle stretch behind your knee (hamstring). Do not let your knee bend while you do this. Keep your other leg flat on the floor. Hold this position for __________ seconds. Repeat __________ times. Complete this exercise __________ times a day. Strengthening exercises These exercises build strength and endurance in your knee. Endurance is the ability to use your muscles for a long time, even after they get tired. Quadriceps, isometric This exercise stretches the muscles in front of your thigh (quadriceps) without moving your knee joint (isometric). Lie on your back with your left / right leg extended and your other knee bent. Put a rolled towel or small pillow under your knee if told by your health care provider. Slowly tense the muscles in the front of your left / right thigh. You should see your kneecap slide up toward your hip or see increased dimpling just above the knee. This motion will push the back of the knee toward the floor. For __________ seconds, hold the muscle as tight as you can without increasing your pain. Relax the muscles slowly and completely. Repeat __________ times. Complete this exercise __________ times a day. Straight leg raises This exercise stretches the muscles in front of your thigh (quadriceps) and the muscles that move your hips (hip flexors). Lie on your back with your left / right leg extended and your other knee bent. Tense the muscles  in the front of your left / right thigh. You should see your kneecap slide up or see increased dimpling just above the knee. Your thigh may even shake a bit. Keep these muscles tight as you raise your leg 4-6 inches (10-15 cm) off the floor. Do not let your knee bend. Hold this position for __________ seconds. Keep these muscles tense as you lower your leg. Relax your muscles slowly and completely after each repetition. Repeat __________ times. Complete this exercise __________ times a day. Hamstring, isometric Lie on your back  on a firm surface. Bend your left / right knee about __________ degrees. Dig your left / right heel into the surface as if you are trying to pull it toward your buttocks. Tighten the muscles in the back of your thighs (hamstring) to "dig" as hard as you can without increasing any pain. Hold this position for __________ seconds. Release the tension gradually and allow your muscles to relax completely for __________ seconds after each repetition. Repeat __________ times. Complete this exercise __________ times a day. Hamstring curls If told by your health care provider, do this exercise while wearing ankle weights. Begin with __________ lb weights. Then increase the weight by 1 lb (0.5 kg) increments. Do not wear ankle weights that are more than __________ lb. Lie on your abdomen with your legs straight. Bend your left / right knee as far as you can without feeling pain. Keep your hips flat against the floor. Hold this position for __________ seconds. Slowly lower your leg to the starting position. Repeat __________ times. Complete this exercise __________ times a day. Squats This exercise strengthens the muscles in front of your thigh and knee (quadriceps). Stand in front of a table, with your feet and knees pointing straight ahead. You may rest your hands on the table for balance but not for support. Slowly bend your knees and lower your hips like you are going to sit  in a chair. Keep your weight over your heels, not over your toes. Keep your lower legs upright so they are parallel with the table legs. Do not let your hips go lower than your knees. Do not bend lower than told by your health care provider. If your knee pain increases, do not bend as low. Hold the squat position for __________ seconds. Slowly push with your legs to return to standing. Do not use your hands to pull yourself to standing. Repeat __________ times. Complete this exercise __________ times a day. Wall slides This exercise strengthens the muscles in front of your thigh and knee (quadriceps). Lean your back against a smooth wall or door, and walk your feet out 18-24 inches (46-61 cm) from it. Place your feet hip-width apart. Slowly slide down the wall or door until your knees bend __________ degrees. Keep your knees over your heels, not over your toes. Keep your knees in line with your hips. Hold this position for __________ seconds. Repeat __________ times. Complete this exercise __________ times a day. Straight leg raises This exercise strengthens the muscles that rotate the leg at the hip and move it away from your body (hip abductors). Lie on your side with your left / right leg in the top position. Lie so your head, shoulder, knee, and hip line up. You may bend your bottom knee to help you keep your balance. Roll your hips slightly forward so your hips are stacked directly over each other and your left / right knee is facing forward. Leading with your heel, lift your top leg 4-6 inches (10-15 cm). You should feel the muscles in your outer hip lifting. Do not let your foot drift forward. Do not let your knee roll toward the ceiling. Hold this position for __________ seconds. Slowly return your leg to the starting position. Let your muscles relax completely after each repetition. Repeat __________ times. Complete this exercise __________ times a day. Straight leg raises This  exercise stretches the muscles that move your hips away from the front of the pelvis (hip extensors). Lie on your abdomen on a  firm surface. You can put a pillow under your hips if that is more comfortable. Tense the muscles in your buttocks and lift your left / right leg about 4-6 inches (10-15 cm). Keep your knee straight as you lift your leg. Hold this position for __________ seconds. Slowly lower your leg to the starting position. Let your leg relax completely after each repetition. Repeat __________ times. Complete this exercise __________ times a day. This information is not intended to replace advice given to you by your health care provider. Make sure you discuss any questions you have with your health care provider. Document Revised: 02/14/2018 Document Reviewed: 02/14/2018 [employer] Patient Education  [place] Exercises Hand exercises can be helpful for almost anyone. These exercises can strengthen the hands, improve flexibility and movement, and increase blood flow to the hands. These results can make work and daily tasks easier. Hand exercises can be especially helpful for people who have joint pain from arthritis or have nerve damage from overuse (carpal tunnel syndrome). These exercises can also help people who have injured a hand. Exercises Most of these hand exercises are gentle stretching and motion exercises. It is usually safe to do them often throughout the day. Warming up your hands before exercise may help to reduce stiffness. You can do this with gentle massage or by placing your hands in warm water for 10-15 minutes. It is normal to feel some stretching, pulling, tightness, or mild discomfort as you begin new exercises. This will gradually improve. Stop an exercise right away if you feel sudden, severe pain or your pain gets worse. Ask your health care provider which exercises are best for you. Knuckle bend or "claw" fist  Stand or sit with your arm, hand,  and all five fingers pointed straight up. Make sure to keep your wrist straight during the exercise. Gently bend your fingers down toward your palm until the tips of your fingers are touching the top of your palm. Keep your big knuckle straight and just bend the small knuckles in your fingers. Hold this position for __________ seconds. Straighten (extend) your fingers back to the starting position. Repeat this exercise 5-10 times with each hand. Full finger fist  Stand or sit with your arm, hand, and all five fingers pointed straight up. Make sure to keep your wrist straight during the exercise. Gently bend your fingers into your palm until the tips of your fingers are touching the middle of your palm. Hold this position for __________ seconds. Extend your fingers back to the starting position, stretching every joint fully. Repeat this exercise 5-10 times with each hand. Straight fist Stand or sit with your arm, hand, and all five fingers pointed straight up. Make sure to keep your wrist straight during the exercise. Gently bend your fingers at the big knuckle, where your fingers meet your hand, and the middle knuckle. Keep the knuckle at the tips of your fingers straight and try to touch the bottom of your palm. Hold this position for __________ seconds. Extend your fingers back to the starting position, stretching every joint fully. Repeat this exercise 5-10 times with each hand. Tabletop  Stand or sit with your arm, hand, and all five fingers pointed straight up. Make sure to keep your wrist straight during the exercise. Gently bend your fingers at the big knuckle, where your fingers meet your hand, as far down as you can while keeping the small knuckles in your fingers straight. Think of forming a tabletop with your  fingers. Hold this position for __________ seconds. Extend your fingers back to the starting position, stretching every joint fully. Repeat this exercise 5-10 times with each  hand. Finger spread  Place your hand flat on a table with your palm facing down. Make sure your wrist stays straight as you do this exercise. Spread your fingers and thumb apart from each other as far as you can until you feel a gentle stretch. Hold this position for __________ seconds. Bring your fingers and thumb tight together again. Hold this position for __________ seconds. Repeat this exercise 5-10 times with each hand. Making circles  Stand or sit with your arm, hand, and all five fingers pointed straight up. Make sure to keep your wrist straight during the exercise. Make a circle by touching the tip of your thumb to the tip of your index finger. Hold for __________ seconds. Then open your hand wide. Repeat this motion with your thumb and each finger on your hand. Repeat this exercise 5-10 times with each hand. Thumb motion  Sit with your forearm resting on a table and your wrist straight. Your thumb should be facing up toward the ceiling. Keep your fingers relaxed as you move your thumb. Lift your thumb up as high as you can toward the ceiling. Hold for __________ seconds. Bend your thumb across your palm as far as you can, reaching the tip of your thumb for the small finger (pinkie) side of your palm. Hold for __________ seconds. Repeat this exercise 5-10 times with each hand. Grip strengthening  Hold a stress ball or other soft ball in the middle of your hand. Slowly increase the pressure, squeezing the ball as much as you can without causing pain. Think of bringing the tips of your fingers into the middle of your palm. All of your finger joints should bend when doing this exercise. Hold your squeeze for __________ seconds, then relax. Repeat this exercise 5-10 times with each hand. Contact a health care provider if: Your hand pain or discomfort gets much worse when you do an exercise. Your hand pain or discomfort does not improve within 2 hours after you exercise. If you have  any of these problems, stop doing these exercises right away. Do not do them again unless your health care provider says that you can. Get help right away if: You develop sudden, severe hand pain or swelling. If this happens, stop doing these exercises right away. Do not do them again unless your health care provider says that you can. This information is not intended to replace advice given to you by your health care provider. Make sure you discuss any questions you have with your health care provider. Document Revised: 08/14/2020 Document Reviewed: 08/14/2020 Else[employer] Patient Education  [place].

## 2021-07-02 NOTE — Progress Notes (Signed)
Office Visit Note  Patient: Jo Garcia             Date of Birth: [birth year]           MRN: [id number]             PCP: Haywood Pao, MD Referring: Haywood Pao, MD Visit Date: 07/16/2021 Occupation: @GUAROCC @  Subjective:  No chief complaint on file.   History of Present Illness: Jo Garcia is a 66 y.o. female with history of osteoarthritis and gout.  She states that she continues to have some stiffness in her hands, knee joints and her feet.  She has not noticed any joint swelling.  She has noticed decreased dexterity and also decreased grip strength in her hands.  She has been massaging her hands and also doing some stretching exercises.  She continues to have some lower back pain.  She has intermittent right-sided radiculopathy.  She has been going to Pilates classes which has been helpful.  She has been taking allopurinol 300 mg p.o. daily.  She states that she has never had a gout flare.  Activities of Daily Living:  Patient reports morning stiffness for all day. Patient Denies nocturnal pain.  Difficulty dressing/grooming: Denies Difficulty climbing stairs: Denies Difficulty getting out of chair: Denies Difficulty using hands for taps, buttons, cutlery, and/or writing: Reports  Review of Systems  Constitutional:  Negative for fatigue.  HENT:  Negative for mouth sores, mouth dryness and nose dryness.   Eyes:  Negative for pain, itching and dryness.  Respiratory:  Negative for shortness of breath and difficulty breathing.   Cardiovascular:  Negative for chest pain and palpitations.  Gastrointestinal:  Negative for blood in stool, constipation and diarrhea.  Endocrine: Negative for increased urination.  Genitourinary:  Negative for difficulty urinating.  Musculoskeletal:  Positive for joint pain, joint pain, morning stiffness and muscle tenderness. Negative for joint swelling, myalgias and myalgias.  Skin:  Negative for color change, rash and  redness.  Allergic/Immunologic: Negative for susceptible to infections.  Neurological:  Positive for numbness. Negative for dizziness, headaches, memory loss and weakness.  Hematological:  Negative for bruising/bleeding tendency.  Psychiatric/Behavioral:  Negative for confusion.    PMFS History:  Patient Active Problem List   Diagnosis Date Noted   Medial epicondylitis of both elbows 01/18/2020   Dyslipidemia 11/04/2016   Osteoarthritis of foot 04/30/2016   History of hypertension 04/30/2016   History of renal calculi 04/30/2016   Vitamin D deficiency 04/30/2016   History of hypothyroidism 04/30/2016   Primary osteoarthritis of both hands 04/30/2016   Primary osteoarthritis of both knees 04/30/2016   Idiopathic chronic gout of multiple sites without tophus 04/30/2016    Past Medical History:  Diagnosis Date   Anemia    Chronic kidney disease    uric acid kidney stones   GERD (gastroesophageal reflux disease)    past hx - uses ranitadine OTC prn only    Gout    Hyperlipidemia    Hypertension    Lumbar spine pain    Neuromuscular disorder (HCC)    hiatal hernia   Osteoarthritis    thumbs, big toe left foot, top both feet   PONV (postoperative nausea and vomiting)    Thyroid disease     Family History  Problem Relation Age of Onset   Rheum arthritis Mother    Tuberculosis Mother        "spinal tuberculosis"?   Heart attack Father    Colon cancer  Cousin    Rheum arthritis Daughter    Hypertension Daughter    Healthy Son    Colon polyps Neg Hx    Esophageal cancer Neg Hx    Rectal cancer Neg Hx    Stomach cancer Neg Hx    Past Surgical History:  Procedure Laterality Date   ABLATION     COLONOSCOPY     DILATION AND CURETTAGE OF UTERUS     EYE SURGERY Bilateral 12/2017   eyelid surgery    SHOULDER SURGERY     repair of tear    Social History   Social History Narrative   Lives with husband   Right handed   Caffeine: occasionally tea or coke     Immunization History  Administered Date(s) Administered   PFIZER(Purple Top)SARS-COV-2 Vaccination 07/25/2019, 08/15/2019, 02/11/2020     Objective: Vital Signs: BP 112/75 (BP Location: Left Arm, Patient Position: Sitting, Cuff Size: Normal)    Pulse (!) 58    Ht 5\' 4"  (1.626 m)    Wt 169 lb 3.2 oz (76.7 kg)    BMI 29.04 kg/m    Physical Exam Vitals and nursing note reviewed.  Constitutional:      Appearance: She is well-developed.  HENT:     Head: Normocephalic and atraumatic.  Eyes:     Conjunctiva/sclera: Conjunctivae normal.  Cardiovascular:     Rate and Rhythm: Normal rate and regular rhythm.     Heart sounds: Normal heart sounds.  Pulmonary:     Effort: Pulmonary effort is normal.     Breath sounds: Normal breath sounds.  Abdominal:     General: Bowel sounds are normal.     Palpations: Abdomen is soft.  Musculoskeletal:     Cervical back: Normal range of motion.  Lymphadenopathy:     Cervical: No cervical adenopathy.  Skin:    General: Skin is warm and dry.     Capillary Refill: Capillary refill takes less than 2 seconds.  Neurological:     Mental Status: She is alert and oriented to person, place, and time.  Psychiatric:        Behavior: Behavior normal.     Musculoskeletal Exam: C-spine was in good range of motion.  She had limited range of motion in her lumbar spine with some discomfort.  Shoulder joints, elbow joints, wrist joints with good range of motion.  She had bilateral PIP and DIP thickening with no synovitis.  Hip joints and knee joints in good range of motion.  There was no tenderness over ankles or MTPs.  CDAI Exam: CDAI Score: -- Patient Global: --; Provider Global: -- Swollen: --; Tender: -- Joint Exam 07/16/2021   No joint exam has been documented for this visit   There is currently no information documented on the homunculus. Go to the Rheumatology activity and complete the homunculus joint exam.  Investigation: No additional  findings.  Imaging: No results found.  Recent Labs: Lab Results  Component Value Date   WBC 5.6 07/18/2020   HGB 11.5 (L) 07/18/2020   PLT 179 07/18/2020   NA 142 07/18/2020   K 4.3 07/18/2020   CL 105 07/18/2020   CO2 30 07/18/2020   GLUCOSE 91 07/18/2020   BUN 22 07/18/2020   CREATININE 1.26 (H) 07/18/2020   BILITOT 0.4 07/18/2020   ALKPHOS 63 11/08/2016   AST 21 07/18/2020   ALT 19 07/18/2020   PROT 6.7 07/18/2020   ALBUMIN 4.3 11/08/2016   CALCIUM 9.9 07/18/2020   GFRAA 52 (  L) 07/18/2020    Speciality Comments: No specialty comments available.  Procedures:  No procedures performed Allergies: Penicillins, Sulfa antibiotics, and Codeine   Assessment / Plan:     Visit Diagnoses: Idiopathic chronic gout of multiple sites without tophus - Allopurinol 300 mg daily. uric acid: 07/18/2020 3.4. -She has not had any gout flare.  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  Uric acid has been in desirable range.  Plan: Uric acid  Medication monitoring encounter -we will check labs today.  Plan: CBC with Differential/Platelet, COMPLETE METABOLIC PANEL WITH GFR  Primary osteoarthritis of both hands-bilateral PIP and DIP thickening was noted.  Decreased grip strength was noted.  Handout on hand exercises was given.  Primary osteoarthritis of both knees-she continues to have some discomfort off and on.  She has been walking on a regular basis and also been doing exercises.  Primary osteoarthritis of both feet-proper fitting shoes were discussed.  DDD (degenerative disc disease), lumbar - L4-5 spondylolisthesis, mild to moderate spinal stenosis at L4-5 and facet joint arthropathy.  She continues to have some right-sided radiculopathy.  She is going for Pilates classes and maintaining her posture which has been helpful.  History of hypertension-her blood pressure is well controlled.  Dyslipidemia-dietary modifications were discussed.  History of  renal calculi  History of vitamin D deficiency  History of hypothyroidism  Orders: Orders Placed This Encounter  Procedures   CBC with Differential/Platelet   COMPLETE METABOLIC PANEL WITH GFR   Uric acid   No orders of the defined types were placed in this encounter.    Follow-Up Instructions: Return in about 6 months (around 01/16/2022) for Osteoarthritis.   Bo Merino, MD  Note - This record has been created using Editor, commissioning.  Chart creation errors have been sought, but may not always  have been located. Such creation errors do not reflect on  the standard of medical care.

## 2021-07-16 ENCOUNTER — Encounter: Payer: Self-pay | Admitting: Rheumatology

## 2021-07-16 ENCOUNTER — Other Ambulatory Visit: Payer: Self-pay

## 2021-07-16 ENCOUNTER — Ambulatory Visit (INDEPENDENT_AMBULATORY_CARE_PROVIDER_SITE_OTHER): Payer: Medicare PPO | Admitting: Rheumatology

## 2021-07-16 VITALS — BP 112/75 | HR 58 | Ht 64.0 in | Wt 169.2 lb

## 2021-07-16 DIAGNOSIS — M19072 Primary osteoarthritis, left ankle and foot: Secondary | ICD-10-CM

## 2021-07-16 DIAGNOSIS — M170 Bilateral primary osteoarthritis of knee: Secondary | ICD-10-CM | POA: Diagnosis not present

## 2021-07-16 DIAGNOSIS — E785 Hyperlipidemia, unspecified: Secondary | ICD-10-CM

## 2021-07-16 DIAGNOSIS — M19041 Primary osteoarthritis, right hand: Secondary | ICD-10-CM

## 2021-07-16 DIAGNOSIS — M5136 Other intervertebral disc degeneration, lumbar region: Secondary | ICD-10-CM

## 2021-07-16 DIAGNOSIS — Z87442 Personal history of urinary calculi: Secondary | ICD-10-CM

## 2021-07-16 DIAGNOSIS — M1A09X0 Idiopathic chronic gout, multiple sites, without tophus (tophi): Secondary | ICD-10-CM | POA: Diagnosis not present

## 2021-07-16 DIAGNOSIS — M19042 Primary osteoarthritis, left hand: Secondary | ICD-10-CM

## 2021-07-16 DIAGNOSIS — M19071 Primary osteoarthritis, right ankle and foot: Secondary | ICD-10-CM

## 2021-07-16 DIAGNOSIS — Z8679 Personal history of other diseases of the circulatory system: Secondary | ICD-10-CM

## 2021-07-16 DIAGNOSIS — Z5181 Encounter for therapeutic drug level monitoring: Secondary | ICD-10-CM | POA: Diagnosis not present

## 2021-07-16 DIAGNOSIS — Z8639 Personal history of other endocrine, nutritional and metabolic disease: Secondary | ICD-10-CM

## 2021-07-16 NOTE — Patient Instructions (Signed)
Hand Exercises Hand exercises can be helpful for almost anyone. These exercises can strengthen the hands, improve flexibility and movement, and increase blood flow to the hands. These results can make work and daily tasks easier. Hand exercises can be especially helpful for people who have joint pain from arthritis or have nerve damage from overuse (carpal tunnel syndrome). These exercises can also help people who have injured a hand. Exercises Most of these hand exercises are gentle stretching and motion exercises. It is usually safe to do them often throughout the day. Warming up your hands before exercise may help to reduce stiffness. You can do this with gentle massage or by placing your hands in warm water for 10-15 minutes. It is normal to feel some stretching, pulling, tightness, or mild discomfort as you begin new exercises. This will gradually improve. Stop an exercise right away if you feel sudden, severe pain or your pain gets worse. Ask your health care provider which exercises are best for you. Knuckle bend or "claw" fist  Stand or sit with your arm, hand, and all five fingers pointed straight up. Make sure to keep your wrist straight during the exercise. Gently bend your fingers down toward your palm until the tips of your fingers are touching the top of your palm. Keep your big knuckle straight and just bend the small knuckles in your fingers. Hold this position for __________ seconds. Straighten (extend) your fingers back to the starting position. Repeat this exercise 5-10 times with each hand. Full finger fist  Stand or sit with your arm, hand, and all five fingers pointed straight up. Make sure to keep your wrist straight during the exercise. Gently bend your fingers into your palm until the tips of your fingers are touching the middle of your palm. Hold this position for __________ seconds. Extend your fingers back to the starting position, stretching every joint fully. Repeat  this exercise 5-10 times with each hand. Straight fist Stand or sit with your arm, hand, and all five fingers pointed straight up. Make sure to keep your wrist straight during the exercise. Gently bend your fingers at the big knuckle, where your fingers meet your hand, and the middle knuckle. Keep the knuckle at the tips of your fingers straight and try to touch the bottom of your palm. Hold this position for __________ seconds. Extend your fingers back to the starting position, stretching every joint fully. Repeat this exercise 5-10 times with each hand. Tabletop  Stand or sit with your arm, hand, and all five fingers pointed straight up. Make sure to keep your wrist straight during the exercise. Gently bend your fingers at the big knuckle, where your fingers meet your hand, as far down as you can while keeping the small knuckles in your fingers straight. Think of forming a tabletop with your fingers. Hold this position for __________ seconds. Extend your fingers back to the starting position, stretching every joint fully. Repeat this exercise 5-10 times with each hand. Finger spread  Place your hand flat on a table with your palm facing down. Make sure your wrist stays straight as you do this exercise. Spread your fingers and thumb apart from each other as far as you can until you feel a gentle stretch. Hold this position for __________ seconds. Bring your fingers and thumb tight together again. Hold this position for __________ seconds. Repeat this exercise 5-10 times with each hand. Making circles  Stand or sit with your arm, hand, and all five fingers pointed   straight up. Make sure to keep your wrist straight during the exercise. Make a circle by touching the tip of your thumb to the tip of your index finger. Hold for __________ seconds. Then open your hand wide. Repeat this motion with your thumb and each finger on your hand. Repeat this exercise 5-10 times with each hand. Thumb  motion  Sit with your forearm resting on a table and your wrist straight. Your thumb should be facing up toward the ceiling. Keep your fingers relaxed as you move your thumb. Lift your thumb up as high as you can toward the ceiling. Hold for __________ seconds. Bend your thumb across your palm as far as you can, reaching the tip of your thumb for the small finger (pinkie) side of your palm. Hold for __________ seconds. Repeat this exercise 5-10 times with each hand. Grip strengthening  Hold a stress ball or other soft ball in the middle of your hand. Slowly increase the pressure, squeezing the ball as much as you can without causing pain. Think of bringing the tips of your fingers into the middle of your palm. All of your finger joints should bend when doing this exercise. Hold your squeeze for __________ seconds, then relax. Repeat this exercise 5-10 times with each hand. Contact a health care provider if: Your hand pain or discomfort gets much worse when you do an exercise. Your hand pain or discomfort does not improve within 2 hours after you exercise. If you have any of these problems, stop doing these exercises right away. Do not do them again unless your health care provider says that you can. Get help right away if: You develop sudden, severe hand pain or swelling. If this happens, stop doing these exercises right away. Do not do them again unless your health care provider says that you can. This information is not intended to replace advice given to you by your health care provider. Make sure you discuss any questions you have with your health care provider. Document Revised: 08/14/2020 Document Reviewed: 08/14/2020 Elsevier Patient Education  2022 [employer]  

## 2021-07-17 LAB — CBC WITH DIFFERENTIAL/PLATELET
Absolute Monocytes: 400 cells/uL (ref 200–950)
Basophils Absolute: 49 cells/uL (ref 0–200)
Basophils Relative: 0.9 %
Eosinophils Absolute: 616 cells/uL — ABNORMAL HIGH (ref 15–500)
Eosinophils Relative: 11.4 %
HCT: 45.2 % — ABNORMAL HIGH (ref 35.0–45.0)
Hemoglobin: 14.8 g/dL (ref 11.7–15.5)
Lymphs Abs: 1490 cells/uL (ref 850–3900)
MCH: 30.6 pg (ref 27.0–33.0)
MCHC: 32.7 g/dL (ref 32.0–36.0)
MCV: 93.4 fL (ref 80.0–100.0)
MPV: 10.5 fL (ref 7.5–12.5)
Monocytes Relative: 7.4 %
Neutro Abs: 2846 cells/uL (ref 1500–7800)
Neutrophils Relative %: 52.7 %
Platelets: 159 10*3/uL (ref 140–400)
RBC: 4.84 10*6/uL (ref 3.80–5.10)
RDW: 13 % (ref 11.0–15.0)
Total Lymphocyte: 27.6 %
WBC: 5.4 10*3/uL (ref 3.8–10.8)

## 2021-07-17 LAB — URIC ACID: Uric Acid, Serum: 4 mg/dL (ref 2.5–7.0)

## 2021-07-17 LAB — COMPLETE METABOLIC PANEL WITH GFR
AG Ratio: 1.7 (calc) (ref 1.0–2.5)
ALT: 20 U/L (ref 6–29)
AST: 24 U/L (ref 10–35)
Albumin: 4.4 g/dL (ref 3.6–5.1)
Alkaline phosphatase (APISO): 60 U/L (ref 37–153)
BUN/Creatinine Ratio: 15 (calc) (ref 6–22)
BUN: 19 mg/dL (ref 7–25)
CO2: 31 mmol/L (ref 20–32)
Calcium: 9.8 mg/dL (ref 8.6–10.4)
Chloride: 105 mmol/L (ref 98–110)
Creat: 1.27 mg/dL — ABNORMAL HIGH (ref 0.50–1.05)
Globulin: 2.6 g/dL (calc) (ref 1.9–3.7)
Glucose, Bld: 99 mg/dL (ref 65–99)
Potassium: 4.3 mmol/L (ref 3.5–5.3)
Sodium: 142 mmol/L (ref 135–146)
Total Bilirubin: 0.5 mg/dL (ref 0.2–1.2)
Total Protein: 7 g/dL (ref 6.1–8.1)
eGFR: 47 mL/min/{1.73_m2} — ABNORMAL LOW (ref >=60)

## 2021-07-17 NOTE — Progress Notes (Signed)
Uric acid is at desirable range.  Creatinine is elevated, most likely due to diuretic use.  CBC is normal.

## 2021-09-18 ENCOUNTER — Telehealth: Payer: Self-pay | Admitting: Gastroenterology

## 2021-09-18 ENCOUNTER — Encounter: Payer: Self-pay | Admitting: Gastroenterology

## 2021-09-18 NOTE — Telephone Encounter (Signed)
Patient has been scheduled for colonoscopy 11/24/21. ?

## 2021-09-18 NOTE — Telephone Encounter (Signed)
Please schedule colonoscopy next available appointment for positive cologaurd. Thanks ?

## 2021-09-18 NOTE — Telephone Encounter (Signed)
Good morning Dr. Silverio Decamp, ? ?We received a call from patient requesting to schedule colonoscopy. Patient has recall for 2028. Per patient, PCP did a cologuard test 09/07/21, and it came back positive. Was advised to call us to schedule. What do you advise? Thank you. ?

## 2021-10-30 ENCOUNTER — Ambulatory Visit (AMBULATORY_SURGERY_CENTER): Payer: Self-pay

## 2021-10-30 VITALS — Ht 64.0 in | Wt 172.0 lb

## 2021-10-30 DIAGNOSIS — Z1211 Encounter for screening for malignant neoplasm of colon: Secondary | ICD-10-CM

## 2021-10-30 MED ORDER — NA SULFATE-K SULFATE-MG SULF 17.5-3.13-1.6 GM/177ML PO SOLN: 1.0000 | Freq: Once | ORAL | 0 refills | Status: AC

## 2021-11-19 ENCOUNTER — Encounter: Payer: Self-pay | Admitting: Gastroenterology

## 2021-11-24 ENCOUNTER — Ambulatory Visit (AMBULATORY_SURGERY_CENTER): Payer: Medicare PPO | Admitting: Gastroenterology

## 2021-11-24 ENCOUNTER — Encounter: Payer: Self-pay | Admitting: Gastroenterology

## 2021-11-24 VITALS — BP 108/68 | HR 50 | Temp 98.6°F | Resp 9 | Ht 64.0 in | Wt 172.0 lb

## 2021-11-24 DIAGNOSIS — Z1211 Encounter for screening for malignant neoplasm of colon: Secondary | ICD-10-CM

## 2021-11-24 DIAGNOSIS — D122 Benign neoplasm of ascending colon: Secondary | ICD-10-CM

## 2021-11-24 MED ORDER — SODIUM CHLORIDE 0.9 % IV SOLN: 500.0000 mL | Freq: Once | INTRAVENOUS | Status: DC

## 2021-11-24 NOTE — Progress Notes (Signed)
To pacu, VSS. Report to [email or web address] 

## 2021-11-24 NOTE — Progress Notes (Signed)
[facility] History and Physical   Primary Care Physician:  [name], [name], MD   Reason for Procedure:  Colorectal cancer screening  Plan:    Screening colonoscopy with possible interventions as needed     HPI: Jo Garcia is a very pleasant 66 y.o. female here for screening colonoscopy. Denies any nausea, vomiting, abdominal pain, melena or bright red blood per rectum  The risks and benefits as well as alternatives of endoscopic procedure(s) have been discussed and reviewed. All questions answered. The patient agrees to proceed.    Past Medical History:  Diagnosis Date   Anemia    Chronic kidney disease    uric acid kidney stones   GERD (gastroesophageal reflux disease)    past hx - uses ranitadine OTC prn only    Gout    Hyperlipidemia    Hypertension    Lumbar spine pain    Neuromuscular disorder (HCC)    hiatal hernia   Osteoarthritis    thumbs, big toe left foot, top both feet   PONV (postoperative nausea and vomiting)    Thyroid disease     Past Surgical History:  Procedure Laterality Date   ABLATION     COLONOSCOPY  2018   KN-MAC-suprep(exc)-tics   DILATION AND CURETTAGE OF UTERUS     no anesthesia during this procedure   EYE SURGERY Bilateral 12/2017   eyelid surgery    SHOULDER SURGERY Right    repair of tear     Prior to Admission medications   Medication Sig Start Date End Date Taking? Authorizing Provider  allopurinol (ZYLOPRIM) 300 MG tablet Take 300 mg by mouth daily.   Yes [provider]  atorvastatin (LIPITOR) 10 MG tablet Take 10 mg by mouth daily.   Yes [provider]  benazepril (LOTENSIN) 20 MG tablet Take 20 mg by mouth daily. Alternating days with Benazepril-HCTZ 06/17/21  Yes [provider]  benazepril-hydrochlorthiazide (LOTENSIN HCT) 20-12.5 MG tablet Take by mouth. 06/17/21  Yes [provider]  levothyroxine (SYNTHROID, LEVOTHROID) 75 MCG tablet Take 75 mcg by mouth daily  before breakfast.   Yes [provider]  metoprolol succinate (TOPROL-XL) 50 MG 24 hr tablet Take 50 mg by mouth daily. Take with or immediately following a meal.   Yes [provider]  acetaminophen (TYLENOL) 500 MG tablet Take 1,500 mg by mouth as needed. Alternates with Ibuprofen    [provider]  cetirizine (ZYRTEC) 10 MG tablet Take 10 mg by mouth as needed for allergies.    [provider]  diclofenac sodium (VOLTAREN) 1 % GEL Apply 4 g topically 4 (four) times daily as needed (arthritis).    [provider]  Ibuprofen 200 MG CAPS Take 400 mg by mouth as needed. Alternates with Tylenol    [provider]    Current Outpatient Medications  Medication Sig Dispense Refill   allopurinol (ZYLOPRIM) 300 MG tablet Take 300 mg by mouth daily.     atorvastatin (LIPITOR) 10 MG tablet Take 10 mg by mouth daily.     benazepril (LOTENSIN) 20 MG tablet Take 20 mg by mouth daily. Alternating days with Benazepril-HCTZ     benazepril-hydrochlorthiazide (LOTENSIN HCT) 20-12.5 MG tablet Take by mouth.     levothyroxine (SYNTHROID, LEVOTHROID) 75 MCG tablet Take 75 mcg by mouth daily before breakfast.     metoprolol succinate (TOPROL-XL) 50 MG 24 hr tablet Take 50 mg by mouth daily. Take with or immediately following a meal.  acetaminophen (TYLENOL) 500 MG tablet Take 1,500 mg by mouth as needed. Alternates with Ibuprofen     cetirizine (ZYRTEC) 10 MG tablet Take 10 mg by mouth as needed for allergies.     diclofenac sodium (VOLTAREN) 1 % GEL Apply 4 g topically 4 (four) times daily as needed (arthritis).     Ibuprofen 200 MG CAPS Take 400 mg by mouth as needed. Alternates with Tylenol     Current Facility-Administered Medications  Medication Dose Route Frequency Provider Last Rate Last Admin   0.9 %  sodium chloride infusion  500 mL Intravenous Once Mauri Pole, MD        Allergies as of 11/24/2021 - Review Complete 11/24/2021  Allergen  Reaction Noted   Penicillins Hives and Rash 08/16/2013   Sulfa antibiotics Hives and Rash 08/16/2013   Bactrim [sulfamethoxazole-trimethoprim] Hives and Rash 10/30/2021   Codeine Nausea And Vomiting 08/16/2013    Family History  Problem Relation Age of Onset   Rheum arthritis Mother    Tuberculosis Mother        "spinal tuberculosis"?   Heart attack Father    Colon cancer Cousin    Rheum arthritis Daughter    Hypertension Daughter    Healthy Son    Colon polyps Neg Hx    Esophageal cancer Neg Hx    Rectal cancer Neg Hx    Stomach cancer Neg Hx     Social History   Socioeconomic History   Marital status: Married    Spouse name: Event organiser   Number of children: 2   Years of education: Not on file   Highest education level: Master's degree (e.g., MA, MS, MEng, MEd, MSW, MBA)  Occupational History   Not on file  Tobacco Use   Smoking status: Never    Passive exposure: Past   Smokeless tobacco: Never  Vaping Use   Vaping Use: Never used  Substance and Sexual Activity   Alcohol use: Never   Drug use: Never   Sexual activity: Not on file  Other Topics Concern   Not on file  Social History Narrative   Lives with husband   Right handed   Caffeine: occasionally tea or coke    Social Determinants of Health   Financial Resource Strain: Not on file  Food Insecurity: Not on file  Transportation Needs: Not on file  Physical Activity: Not on file  Stress: Not on file  Social Connections: Not on file  Intimate Partner Violence: Not on file    Review of Systems:  All other review of systems negative except as mentioned in the HPI.  Physical Exam: Vital signs in last 24 hours: BP 104/68   Pulse 65   Temp 98.6 F (37 C)   Ht _0  (1.626 m)   Wt 172 lb (78 kg)   SpO2 96%   BMI 29.52 kg/m  General:   Alert, NAD Lungs:  Clear .   Heart:  Regular rate and rhythm Abdomen:  Soft, nontender and nondistended. Neuro/Psych:  Alert and cooperative. Normal mood and affect.  A and O x 3  Reviewed labs, radiology imaging, old records and pertinent past GI work up  Patient is appropriate for planned procedure(s) and anesthesia in an ambulatory setting   K. Denzil Magnuson , MD [phone]

## 2021-11-24 NOTE — Progress Notes (Signed)
Pt's states no medical or surgical changes since previsit or office visit. 

## 2021-11-24 NOTE — Patient Instructions (Signed)
**  Handouts given on polyps and Hemorrhoids**   YOU HAD AN ENDOSCOPIC PROCEDURE TODAY AT [facility]:   Refer to the procedure report that was given to you for any specific questions about what was found during the examination.  If the procedure report does not answer your questions, please call your gastroenterologist to clarify.  If you requested that your care partner not be given the details of your procedure findings, then the procedure report has been included in a sealed envelope for you to review at your convenience later.  YOU SHOULD EXPECT: Some feelings of bloating in the abdomen. Passage of more gas than usual.  Walking can help get rid of the air that was put into your GI tract during the procedure and reduce the bloating. If you had a lower endoscopy (such as a colonoscopy or flexible sigmoidoscopy) you may notice spotting of blood in your stool or on the toilet paper. If you underwent a bowel prep for your procedure, you may not have a normal bowel movement for a few days.  Please Note:  You might notice some irritation and congestion in your nose or some drainage.  This is from the oxygen used during your procedure.  There is no need for concern and it should clear up in a day or so.  SYMPTOMS TO REPORT IMMEDIATELY:  Following lower endoscopy (colonoscopy or flexible sigmoidoscopy):  Excessive amounts of blood in the stool  Significant tenderness or worsening of abdominal pains  Swelling of the abdomen that is new, acute  Fever of 100F or higher   For urgent or emergent issues, a gastroenterologist can be reached at any hour by calling [phone]. Do not use MyChart messaging for urgent concerns.    DIET:  We do recommend a small meal at first, but then you may proceed to your regular diet.  Drink plenty of fluids but you should avoid alcoholic beverages for 24 hours.  ACTIVITY:  You should plan to take it easy for the rest of today and you should NOT  DRIVE or use heavy machinery until tomorrow (because of the sedation medicines used during the test).    FOLLOW UP: Our staff will call the number listed on your records the next business day following your procedure.  We will call around 7:15- 8:00 am to check on you and address any questions or concerns that you may have regarding the information given to you following your procedure. If we do not reach you, we will leave a message.  If you develop any symptoms (ie: fever, flu-like symptoms, shortness of breath, cough etc.) before then, please call [phone].  If you test positive for Covid 19 in the 2 weeks post procedure, please call and report this information to [place].    If any biopsies were taken you will be contacted by phone or by letter within the next 1-3 weeks.  Please call us at [phone] if you have not heard about the biopsies in 3 weeks.    SIGNATURES/CONFIDENTIALITY: You and/or your care partner have signed paperwork which will be entered into your electronic medical record.  These signatures attest to the fact that that the information above on your After Visit Summary has been reviewed and is understood.  Full responsibility of the confidentiality of this discharge information lies with you and/or your care-partner.

## 2021-11-24 NOTE — Progress Notes (Signed)
Called to room to assist during endoscopic procedure.  Patient ID and intended procedure confirmed with present staff. Received instructions for my participation in the procedure from the performing physician.  

## 2021-11-24 NOTE — Op Note (Signed)
[facility] Patient Name: Jo Garcia Procedure Date: 11/24/2021 8:27 AM MRN: [id number] Endoscopist: Mauri Pole , MD Age: 66 Referring MD:  Date of Birth: Jun 17, 1955 Gender: Female Account #: [id number] Procedure:                Colonoscopy Indications:              Screening for colorectal malignant neoplasm Medicines:                Monitored Anesthesia Care Procedure:                Pre-Anesthesia Assessment:                           - Prior to the procedure, a History and Physical                            was performed, and patient medications and                            allergies were reviewed. The patient's tolerance of                            previous anesthesia was also reviewed. The risks                            and benefits of the procedure and the sedation                            options and risks were discussed with the patient.                            All questions were answered, and informed consent                            was obtained. Prior Anticoagulants: The patient has                            taken no previous anticoagulant or antiplatelet                            agents. ASA Grade Assessment: II - A patient with                            mild systemic disease. After reviewing the risks                            and benefits, the patient was deemed in                            satisfactory condition to undergo the procedure.                           After obtaining informed consent, the colonoscope  was passed under direct vision. Throughout the                            procedure, the patient's blood pressure, pulse, and                            oxygen saturations were monitored continuously. The                            Olympus PCF-H190DL (#[id number]) Colonoscope was                            introduced through the anus and advanced to the the                            cecum,  identified by appendiceal orifice and                            ileocecal valve. The colonoscopy was performed                            without difficulty. The patient tolerated the                            procedure well. The quality of the bowel                            preparation was good. The ileocecal valve,                            appendiceal orifice, and rectum were photographed. Scope In: 8:34:48 AM Scope Out: 8:53:28 AM Scope Withdrawal Time: 0 hours 11 minutes 32 seconds  Total Procedure Duration: 0 hours 18 minutes 40 seconds  Findings:                 The perianal and digital rectal examinations were                            normal.                           Two sessile polyps were found in the ascending                            colon. The polyps were 1 to 2 mm in size. These                            polyps were removed with a cold biopsy forceps.                            Resection and retrieval were complete.                           Non-bleeding external and internal hemorrhoids were  found during retroflexion. The hemorrhoids were                            small. Complications:            No immediate complications. Estimated Blood Loss:     Estimated blood loss was minimal. Impression:               - Two 1 to 2 mm polyps in the ascending colon,                            removed with a cold biopsy forceps. Resected and                            retrieved.                           - Non-bleeding external and internal hemorrhoids. Recommendation:           - Patient has a contact number available for                            emergencies. The signs and symptoms of potential                            delayed complications were discussed with the                            patient. Return to normal activities tomorrow.                            Written discharge instructions were provided to the                             patient.                           - Resume previous diet.                           - Continue present medications.                           - Await pathology results.                           - Repeat colonoscopy in 5-10 years for surveillance                            based on pathology results. Mauri Pole, MD 11/24/2021 8:59:31 AM This report has been signed electronically.

## 2021-11-25 ENCOUNTER — Telehealth: Payer: Self-pay | Admitting: *Deleted

## 2021-11-25 NOTE — Telephone Encounter (Signed)
  Follow up Call-     11/24/2021    7:52 AM  Call back number  Post procedure Call Back phone  # [phone]  Permission to leave phone message Yes     Patient questions:  Do you have a fever, pain , or abdominal swelling? No. Pain Score  0 *  Have you tolerated food without any problems? Yes.    Have you been able to return to your normal activities? Yes.    Do you have any questions about your discharge instructions: Diet   No. Medications  No. Follow up visit  No.  Do you have questions or concerns about your Care? No.  Actions: * If pain score is 4 or above: No action needed, pain <4.

## 2021-12-14 ENCOUNTER — Encounter: Payer: Self-pay | Admitting: Gastroenterology

## 2022-01-06 NOTE — Progress Notes (Signed)
Office Visit Note  Patient: Jo Garcia             Date of Birth: 07/23/1955           MRN: [id number]             PCP: Haywood Pao, MD Referring: Haywood Pao, MD Visit Date: 01/19/2022 Occupation: _0 @  Subjective:  Pain in multiple joints  History of Present Illness: Jo Garcia is a 66 y.o. female with history of osteoarthritis, degenerative disc disease and gout.  She continues to have pain and stiffness in her bilateral hands, bilateral knee joints and her feet.  She has not had a gout flare in a long time.  She has been taking allopurinol 300 mg daily.  She continues to have lower back pain.  She has been going to Pilates classes which has been helpful.  She has discomfort in her right knee and her right calf area.  She states that she had a colonoscopy recently which was negative.  She had some polyps which were removed and they were benign.  Patient states she had a DEXA scan done by Dr. Osborne Casco which was within normal limits.  Activities of Daily Living:  Patient reports morning stiffness for all day. Patient Denies nocturnal pain.  Difficulty dressing/grooming: Denies Difficulty climbing stairs: Denies Difficulty getting out of chair: Denies Difficulty using hands for taps, buttons, cutlery, and/or writing: Reports  Review of Systems  Constitutional:  Positive for fatigue.  HENT:  Negative for mouth sores and mouth dryness.   Eyes:  Negative for dryness.  Respiratory:  Negative for shortness of breath.   Cardiovascular:  Negative for chest pain and palpitations.  Gastrointestinal:  Negative for blood in stool, constipation and diarrhea.  Endocrine: Negative for increased urination.  Genitourinary:  Negative for involuntary urination.  Musculoskeletal:  Positive for joint pain, joint pain, morning stiffness and muscle tenderness. Negative for gait problem, joint swelling, myalgias, muscle weakness and myalgias.  Skin:  Positive for  sensitivity to sunlight. Negative for color change, rash and hair loss.  Allergic/Immunologic: Negative for susceptible to infections.  Neurological:  Negative for dizziness and headaches.  Hematological:  Negative for swollen glands.  Psychiatric/Behavioral:  Negative for depressed mood and sleep disturbance. The patient is not nervous/anxious.     PMFS History:  Patient Active Problem List   Diagnosis Date Noted   Medial epicondylitis of both elbows 01/18/2020   Dyslipidemia 11/04/2016   Osteoarthritis of foot 04/30/2016   History of hypertension 04/30/2016   History of renal calculi 04/30/2016   Vitamin D deficiency 04/30/2016   History of hypothyroidism 04/30/2016   Primary osteoarthritis of both hands 04/30/2016   Primary osteoarthritis of both knees 04/30/2016   Idiopathic chronic gout of multiple sites without tophus 04/30/2016    Past Medical History:  Diagnosis Date   Anemia    Chronic kidney disease    uric acid kidney stones   GERD (gastroesophageal reflux disease)    past hx - uses ranitadine OTC prn only    Gout    Hyperlipidemia    Hypertension    Lumbar spine pain    Neuromuscular disorder (HCC)    hiatal hernia   Osteoarthritis    thumbs, big toe left foot, top both feet   PONV (postoperative nausea and vomiting)    Thyroid disease     Family History  Problem Relation Age of Onset   Rheum arthritis Mother    Tuberculosis Mother        "  spinal tuberculosis"?   Heart attack Father    Colon cancer Cousin    Rheum arthritis Daughter    Hypertension Daughter    Healthy Son    Colon polyps Neg Hx    Esophageal cancer Neg Hx    Rectal cancer Neg Hx    Stomach cancer Neg Hx    Past Surgical History:  Procedure Laterality Date   ABLATION     COLONOSCOPY  2018   KN-MAC-suprep(exc)-tics   DILATION AND CURETTAGE OF UTERUS     no anesthesia during this procedure   EYE SURGERY Bilateral 12/2017   eyelid surgery    SHOULDER SURGERY Right    repair of  tear    Social History   Social History Narrative   Lives with husband   Right handed   Caffeine: occasionally tea or coke    Immunization History  Administered Date(s) Administered   PFIZER(Purple Top)SARS-COV-2 Vaccination 07/25/2019, 08/15/2019, 02/11/2020     Objective: Vital Signs: BP 108/72 (BP Location: Left Arm, Patient Position: Sitting, Cuff Size: Normal)   Pulse (!) 50   Resp 15   Ht 5' 4.5" (1.638 m)   Wt 169 lb 6.4 oz (76.8 kg)   BMI 28.63 kg/m    Physical Exam Vitals and nursing note reviewed.  Constitutional:      Appearance: She is well-developed.  HENT:     Head: Normocephalic and atraumatic.  Eyes:     Conjunctiva/sclera: Conjunctivae normal.  Cardiovascular:     Rate and Rhythm: Normal rate and regular rhythm.     Heart sounds: Normal heart sounds.  Pulmonary:     Effort: Pulmonary effort is normal.     Breath sounds: Normal breath sounds.  Abdominal:     General: Bowel sounds are normal.     Palpations: Abdomen is soft.  Musculoskeletal:     Cervical back: Normal range of motion.  Lymphadenopathy:     Cervical: No cervical adenopathy.  Skin:    General: Skin is warm and dry.     Capillary Refill: Capillary refill takes less than 2 seconds.  Neurological:     Mental Status: She is alert and oriented to person, place, and time.  Psychiatric:        Behavior: Behavior normal.      Musculoskeletal Exam: Cervical spine was in good range of motion.  She had limited range of motion of her thoracic and lumbar spine.  Shoulder joints, elbow joints, wrist joints with good range of motion.  She had incomplete fist formation bilaterally due to osteoarthritis.  Bilateral PIP DIP and CMC thickening was noted.  Hip joints and knee joints were in good range of motion.  She had no tenderness over ankles or MTPs.  Dorsal spurs and hammertoes were noted.  CDAI Exam: CDAI Score: -- Patient Global: --; Provider Global: -- Swollen: --; Tender: -- Joint Exam  01/19/2022   No joint exam has been documented for this visit   There is currently no information documented on the homunculus. Go to the Rheumatology activity and complete the homunculus joint exam.  Investigation: No additional findings.  Imaging: No results found.  Recent Labs: Lab Results  Component Value Date   WBC 5.4 07/16/2021   HGB 14.8 07/16/2021   PLT 159 07/16/2021   NA 142 07/16/2021   K 4.3 07/16/2021   CL 105 07/16/2021   CO2 31 07/16/2021   GLUCOSE 99 07/16/2021   BUN 19 07/16/2021   CREATININE 1.27 (H) 07/16/2021  BILITOT 0.5 07/16/2021   ALKPHOS 63 11/08/2016   AST 24 07/16/2021   ALT 20 07/16/2021   PROT 7.0 07/16/2021   ALBUMIN 4.3 11/08/2016   CALCIUM 9.8 07/16/2021   GFRAA 52 (L) 07/18/2020    Speciality Comments: No specialty comments available.  Procedures:  No procedures performed Allergies: Penicillins, Sulfa antibiotics, Bactrim [sulfamethoxazole-trimethoprim], and Codeine   Assessment / Plan:     Visit Diagnoses: Idiopathic chronic gout of multiple sites without tophus -she has been taking allopurinol 300 mg daily. uric acid: 4.0 on 07/16/2021.  She denies having gout flares since the last visit..  Plan: Uric acid  Medication monitoring encounter - Plan: CBC with Differential/Platelet, COMPLETE METABOLIC PANEL WITH GFR  Primary osteoarthritis of both hands-she complains of pain and discomfort in her bilateral hands.  Bilateral CMC PIP and DIP thickening consistent with osteoarthritis was noted.  Joint protection muscle strengthening was discussed.  Primary osteoarthritis of both knees-she complains of discomfort in her bilateral knee joints.  Lower extremity muscle strengthening exercises were discussed.  X-rays obtained in 2022 were reviewed which showed bilateral moderate chondromalacia patella.  Primary osteoarthritis of both feet-she can history of pain and discomfort in her bilateral feet.  She has dorsal spurs and hammertoes.  DDD  (degenerative disc disease), lumbar - L4-5 spondylolisthesis, mild to moderate spinal stenosis at L4-5 and facet joint arthropathy.  She continues to have lower back discomfort.  Core strengthening exercises were discussed and demonstrated.  History of hypertension-blood pressure was normal today.  Other medical problems are listed as follows:  History of renal calculi  Dyslipidemia  History of vitamin D deficiency  History of hypothyroidism  Orders: Orders Placed This Encounter  Procedures   CBC with Differential/Platelet   COMPLETE METABOLIC PANEL WITH GFR   Uric acid   No orders of the defined types were placed in this encounter.    Follow-Up Instructions: Return in about 6 months (around 07/20/2022) for Osteoarthritis, Gout.   Bo Merino, MD  Note - This record has been created using Editor, commissioning.  Chart creation errors have been sought, but may not always  have been located. Such creation errors do not reflect on  the standard of medical care.

## 2022-01-19 ENCOUNTER — Encounter: Payer: Self-pay | Admitting: Rheumatology

## 2022-01-19 ENCOUNTER — Ambulatory Visit: Payer: Medicare PPO | Attending: Rheumatology | Admitting: Rheumatology

## 2022-01-19 VITALS — BP 108/72 | HR 50 | Resp 15 | Ht 64.5 in | Wt 169.4 lb

## 2022-01-19 DIAGNOSIS — M19071 Primary osteoarthritis, right ankle and foot: Secondary | ICD-10-CM

## 2022-01-19 DIAGNOSIS — E785 Hyperlipidemia, unspecified: Secondary | ICD-10-CM

## 2022-01-19 DIAGNOSIS — Z87442 Personal history of urinary calculi: Secondary | ICD-10-CM

## 2022-01-19 DIAGNOSIS — M5136 Other intervertebral disc degeneration, lumbar region: Secondary | ICD-10-CM | POA: Diagnosis not present

## 2022-01-19 DIAGNOSIS — Z8679 Personal history of other diseases of the circulatory system: Secondary | ICD-10-CM | POA: Diagnosis not present

## 2022-01-19 DIAGNOSIS — Z5181 Encounter for therapeutic drug level monitoring: Secondary | ICD-10-CM

## 2022-01-19 DIAGNOSIS — M51369 Other intervertebral disc degeneration, lumbar region without mention of lumbar back pain or lower extremity pain: Secondary | ICD-10-CM

## 2022-01-19 DIAGNOSIS — M19072 Primary osteoarthritis, left ankle and foot: Secondary | ICD-10-CM

## 2022-01-19 DIAGNOSIS — M1A09X0 Idiopathic chronic gout, multiple sites, without tophus (tophi): Secondary | ICD-10-CM

## 2022-01-19 DIAGNOSIS — M19041 Primary osteoarthritis, right hand: Secondary | ICD-10-CM

## 2022-01-19 DIAGNOSIS — M170 Bilateral primary osteoarthritis of knee: Secondary | ICD-10-CM

## 2022-01-19 DIAGNOSIS — M19042 Primary osteoarthritis, left hand: Secondary | ICD-10-CM

## 2022-01-19 DIAGNOSIS — Z8639 Personal history of other endocrine, nutritional and metabolic disease: Secondary | ICD-10-CM

## 2022-01-19 NOTE — Patient Instructions (Signed)
Hand Exercises Hand exercises can be helpful for almost anyone. These exercises can strengthen the hands, improve flexibility and movement, and increase blood flow to the hands. These results can make work and daily tasks easier. Hand exercises can be especially helpful for people who have joint pain from arthritis or have nerve damage from overuse (carpal tunnel syndrome). These exercises can also help people who have injured a hand. Exercises Most of these hand exercises are gentle stretching and motion exercises. It is usually safe to do them often throughout the day. Warming up your hands before exercise may help to reduce stiffness. You can do this with gentle massage or by placing your hands in warm water for 10-15 minutes. It is normal to feel some stretching, pulling, tightness, or mild discomfort as you begin new exercises. This will gradually improve. Stop an exercise right away if you feel sudden, severe pain or your pain gets worse. Ask your health care provider which exercises are best for you. Knuckle bend or "claw" fist  Stand or sit with your arm, hand, and all five fingers pointed straight up. Make sure to keep your wrist straight during the exercise. Gently bend your fingers down toward your palm until the tips of your fingers are touching the top of your palm. Keep your big knuckle straight and just bend the small knuckles in your fingers. Hold this position for __________ seconds. Straighten (extend) your fingers back to the starting position. Repeat this exercise 5-10 times with each hand. Full finger fist  Stand or sit with your arm, hand, and all five fingers pointed straight up. Make sure to keep your wrist straight during the exercise. Gently bend your fingers into your palm until the tips of your fingers are touching the middle of your palm. Hold this position for __________ seconds. Extend your fingers back to the starting position, stretching every joint fully. Repeat  this exercise 5-10 times with each hand. Straight fist Stand or sit with your arm, hand, and all five fingers pointed straight up. Make sure to keep your wrist straight during the exercise. Gently bend your fingers at the big knuckle, where your fingers meet your hand, and the middle knuckle. Keep the knuckle at the tips of your fingers straight and try to touch the bottom of your palm. Hold this position for __________ seconds. Extend your fingers back to the starting position, stretching every joint fully. Repeat this exercise 5-10 times with each hand. Tabletop  Stand or sit with your arm, hand, and all five fingers pointed straight up. Make sure to keep your wrist straight during the exercise. Gently bend your fingers at the big knuckle, where your fingers meet your hand, as far down as you can while keeping the small knuckles in your fingers straight. Think of forming a tabletop with your fingers. Hold this position for __________ seconds. Extend your fingers back to the starting position, stretching every joint fully. Repeat this exercise 5-10 times with each hand. Finger spread  Place your hand flat on a table with your palm facing down. Make sure your wrist stays straight as you do this exercise. Spread your fingers and thumb apart from each other as far as you can until you feel a gentle stretch. Hold this position for __________ seconds. Bring your fingers and thumb tight together again. Hold this position for __________ seconds. Repeat this exercise 5-10 times with each hand. Making circles  Stand or sit with your arm, hand, and all five fingers pointed   straight up. Make sure to keep your wrist straight during the exercise. Make a circle by touching the tip of your thumb to the tip of your index finger. Hold for __________ seconds. Then open your hand wide. Repeat this motion with your thumb and each finger on your hand. Repeat this exercise 5-10 times with each hand. Thumb  motion  Sit with your forearm resting on a table and your wrist straight. Your thumb should be facing up toward the ceiling. Keep your fingers relaxed as you move your thumb. Lift your thumb up as high as you can toward the ceiling. Hold for __________ seconds. Bend your thumb across your palm as far as you can, reaching the tip of your thumb for the small finger (pinkie) side of your palm. Hold for __________ seconds. Repeat this exercise 5-10 times with each hand. Grip strengthening  Hold a stress ball or other soft ball in the middle of your hand. Slowly increase the pressure, squeezing the ball as much as you can without causing pain. Think of bringing the tips of your fingers into the middle of your palm. All of your finger joints should bend when doing this exercise. Hold your squeeze for __________ seconds, then relax. Repeat this exercise 5-10 times with each hand. Contact a health care provider if: Your hand pain or discomfort gets much worse when you do an exercise. Your hand pain or discomfort does not improve within 2 hours after you exercise. If you have any of these problems, stop doing these exercises right away. Do not do them again unless your health care provider says that you can. Get help right away if: You develop sudden, severe hand pain or swelling. If this happens, stop doing these exercises right away. Do not do them again unless your health care provider says that you can. This information is not intended to replace advice given to you by your health care provider. Make sure you discuss any questions you have with your health care provider. Document Revised: 08/14/2020 Document Reviewed: 08/14/2020 Elsevier Patient Education  2023 [employer]  

## 2022-01-20 LAB — CBC WITH DIFFERENTIAL/PLATELET
Absolute Monocytes: 454 cells/uL (ref 200–950)
Basophils Absolute: 38 cells/uL (ref 0–200)
Basophils Relative: 0.6 %
Eosinophils Absolute: 544 cells/uL — ABNORMAL HIGH (ref 15–500)
Eosinophils Relative: 8.5 %
HCT: 43.8 % (ref 35.0–45.0)
Hemoglobin: 14.4 g/dL (ref 11.7–15.5)
Lymphs Abs: 1734 cells/uL (ref 850–3900)
MCH: 30.4 pg (ref 27.0–33.0)
MCHC: 32.9 g/dL (ref 32.0–36.0)
MCV: 92.6 fL (ref 80.0–100.0)
MPV: 10.7 fL (ref 7.5–12.5)
Monocytes Relative: 7.1 %
Neutro Abs: 3629 cells/uL (ref 1500–7800)
Neutrophils Relative %: 56.7 %
Platelets: 165 10*3/uL (ref 140–400)
RBC: 4.73 10*6/uL (ref 3.80–5.10)
RDW: 13.6 % (ref 11.0–15.0)
Total Lymphocyte: 27.1 %
WBC: 6.4 10*3/uL (ref 3.8–10.8)

## 2022-01-20 LAB — COMPLETE METABOLIC PANEL WITH GFR
AG Ratio: 1.8 (calc) (ref 1.0–2.5)
ALT: 16 U/L (ref 6–29)
AST: 21 U/L (ref 10–35)
Albumin: 4.5 g/dL (ref 3.6–5.1)
Alkaline phosphatase (APISO): 69 U/L (ref 37–153)
BUN/Creatinine Ratio: 15 (calc) (ref 6–22)
BUN: 18 mg/dL (ref 7–25)
CO2: 31 mmol/L (ref 20–32)
Calcium: 9.8 mg/dL (ref 8.6–10.4)
Chloride: 106 mmol/L (ref 98–110)
Creat: 1.2 mg/dL — ABNORMAL HIGH (ref 0.50–1.05)
Globulin: 2.5 g/dL (calc) (ref 1.9–3.7)
Glucose, Bld: 91 mg/dL (ref 65–99)
Potassium: 4.6 mmol/L (ref 3.5–5.3)
Sodium: 143 mmol/L (ref 135–146)
Total Bilirubin: 0.5 mg/dL (ref 0.2–1.2)
Total Protein: 7 g/dL (ref 6.1–8.1)
eGFR: 50 mL/min/{1.73_m2} — ABNORMAL LOW (ref >=60)

## 2022-01-20 LAB — URIC ACID: Uric Acid, Serum: 4 mg/dL (ref 2.5–7.0)

## 2022-01-20 NOTE — Progress Notes (Signed)
CBC stable.  Creatinine is elevated and stable.  Uric acid is in the desirable range.  No change in treatment advised.

## 2022-02-10 DIAGNOSIS — L718 Other rosacea: Secondary | ICD-10-CM | POA: Diagnosis not present

## 2022-02-10 DIAGNOSIS — L821 Other seborrheic keratosis: Secondary | ICD-10-CM | POA: Diagnosis not present

## 2022-02-10 DIAGNOSIS — L570 Actinic keratosis: Secondary | ICD-10-CM | POA: Diagnosis not present

## 2022-02-13 DIAGNOSIS — Z23 Encounter for immunization: Secondary | ICD-10-CM | POA: Diagnosis not present

## 2022-02-22 DIAGNOSIS — Z1231 Encounter for screening mammogram for malignant neoplasm of breast: Secondary | ICD-10-CM | POA: Diagnosis not present

## 2022-02-22 DIAGNOSIS — Z01419 Encounter for gynecological examination (general) (routine) without abnormal findings: Secondary | ICD-10-CM | POA: Diagnosis not present

## 2022-02-24 ENCOUNTER — Other Ambulatory Visit: Payer: Self-pay | Admitting: Obstetrics and Gynecology

## 2022-02-24 DIAGNOSIS — R928 Other abnormal and inconclusive findings on diagnostic imaging of breast: Secondary | ICD-10-CM

## 2022-03-05 ENCOUNTER — Other Ambulatory Visit: Payer: Self-pay | Admitting: Obstetrics and Gynecology

## 2022-03-05 ENCOUNTER — Ambulatory Visit
Admission: RE | Admit: 2022-03-05 | Discharge: 2022-03-05 | Disposition: A | Discharge status: Home / Self Care | Payer: Medicare PPO | Source: Ambulatory Visit | Attending: Obstetrics and Gynecology | Admitting: Obstetrics and Gynecology

## 2022-03-05 DIAGNOSIS — R928 Other abnormal and inconclusive findings on diagnostic imaging of breast: Secondary | ICD-10-CM

## 2022-03-05 DIAGNOSIS — N6310 Unspecified lump in the right breast, unspecified quadrant: Secondary | ICD-10-CM

## 2022-03-05 DIAGNOSIS — N6315 Unspecified lump in the right breast, overlapping quadrants: Secondary | ICD-10-CM | POA: Diagnosis not present

## 2022-03-12 ENCOUNTER — Ambulatory Visit
Admission: RE | Admit: 2022-03-12 | Discharge: 2022-03-12 | Disposition: A | Discharge status: Home / Self Care | Payer: Medicare PPO | Source: Ambulatory Visit | Attending: Obstetrics and Gynecology | Admitting: Obstetrics and Gynecology

## 2022-03-12 DIAGNOSIS — N6315 Unspecified lump in the right breast, overlapping quadrants: Secondary | ICD-10-CM | POA: Diagnosis not present

## 2022-03-12 DIAGNOSIS — C50811 Malignant neoplasm of overlapping sites of right female breast: Secondary | ICD-10-CM | POA: Diagnosis not present

## 2022-03-12 DIAGNOSIS — N6310 Unspecified lump in the right breast, unspecified quadrant: Secondary | ICD-10-CM

## 2022-03-16 ENCOUNTER — Telehealth: Payer: Self-pay | Admitting: Hematology and Oncology

## 2022-03-16 NOTE — Telephone Encounter (Signed)
Spoke to patient to confirm upcoming morning [facility] clinic appointment on 11/15, paperwork will be sent via e-mail.   Gave location and time, also informed patient that the surgeon's office would be calling as well to get information from them similar to the packet that they will be receiving so make sure to do both.  Reminded patient that all providers will be coming to the clinic to see them HERE and if they had any questions to not hesitate to reach back out to myself or their navigators.

## 2022-03-22 ENCOUNTER — Encounter: Payer: Self-pay | Admitting: *Deleted

## 2022-03-22 DIAGNOSIS — Z170 Estrogen receptor positive status [ER+]: Secondary | ICD-10-CM | POA: Insufficient documentation

## 2022-03-23 NOTE — Progress Notes (Signed)
Radiation Oncology         [phone] ________________________________  Multidisciplinary Breast Oncology Clinic [facility]) Initial Outpatient Consultation  Name: Jo Garcia MRN: [id number]  Date: 03/24/2022  DOB: 1956-04-19  CB:[id number], [name], MD  Jovita Kussmaul, MD   REFERRING PHYSICIAN: [name], MD  DIAGNOSIS: There were no encounter diagnoses.  Stage *** Right Breast UOQ, Invasive Ductal Carcinoma, ER+ / PR+ / Her2-, Grade 2   No diagnosis found.  HISTORY OF PRESENT ILLNESS::Jo Garcia is a 66 y.o. female who is presenting to the office today for evaluation of her newly diagnosed breast cancer. She is accompanied by ***. She is doing well overall.   She had routine screening mammography on 02/22/22 showing a possible abnormality in the right breast. She underwent unilateral right breast diagnostic mammography with tomography and right breast ultrasonography at [facility] on 03/05/22 showing: a 1.2 x 0.8 x 0.5 cm irregular mass in the 12 o'clock position of the right breast, 3 cmfn, suspicious of malignancy. No abnormal right axillary lymph nodes were appreciated.   Biopsy of the 12 o;clock right breast on 03/12/22 showed: grade 2 invasive ductal carcinoma measuring 0.7 cm in the greatest linear extent of the sample. Prognostic indicators significant for: estrogen receptor, 95% positive and progesterone receptor, 90% positive, both with strong staining intensity. Proliferation marker Ki67 at 20%. HER2 negative.  Menarche: *** years old Age at first live birth: *** years old GP: *** LMP: *** Contraceptive: *** HRT: ***   The patient was referred today for presentation in the multidisciplinary conference.  Radiology studies and pathology slides were presented there for review and discussion of treatment options.  A consensus was discussed regarding potential next steps.  PREVIOUS RADIATION THERAPY: {EXAM; YES/NO:19492::"No"}  PAST MEDICAL  HISTORY:  Past Medical History:  Diagnosis Date   Anemia    Chronic kidney disease    uric acid kidney stones   GERD (gastroesophageal reflux disease)    past hx - uses ranitadine OTC prn only    Gout    Hyperlipidemia    Hypertension    Lumbar spine pain    Neuromuscular disorder (HCC)    hiatal hernia   Osteoarthritis    thumbs, big toe left foot, top both feet   PONV (postoperative nausea and vomiting)    Thyroid disease     PAST SURGICAL HISTORY: Past Surgical History:  Procedure Laterality Date   ABLATION     COLONOSCOPY  2018   KN-MAC-suprep(exc)-tics   DILATION AND CURETTAGE OF UTERUS     no anesthesia during this procedure   EYE SURGERY Bilateral 12/2017   eyelid surgery    SHOULDER SURGERY Right    repair of tear     FAMILY HISTORY:  Family History  Problem Relation Age of Onset   Rheum arthritis Mother    Tuberculosis Mother        "spinal tuberculosis"?   Heart attack Father    Colon cancer Cousin    Rheum arthritis Daughter    Hypertension Daughter    Healthy Son    Colon polyps Neg Hx    Esophageal cancer Neg Hx    Rectal cancer Neg Hx    Stomach cancer Neg Hx     SOCIAL HISTORY:  Social History   Socioeconomic History   Marital status: Married    Spouse name: Event organiser   Number of children: 2   Years of education: Not on file   Highest education  level: Master's degree (e.g., MA, MS, MEng, MEd, MSW, MBA)  Occupational History   Not on file  Tobacco Use   Smoking status: Never    Passive exposure: Past   Smokeless tobacco: Never  Vaping Use   Vaping Use: Never used  Substance and Sexual Activity   Alcohol use: Never   Drug use: Never   Sexual activity: Not on file  Other Topics Concern   Not on file  Social History Narrative   Lives with husband   Right handed   Caffeine: occasionally tea or coke    Social Determinants of Health   Financial Resource Strain: Not on file  Food Insecurity: Not on file  Transportation Needs: Not  on file  Physical Activity: Not on file  Stress: Not on file  Social Connections: Not on file    ALLERGIES:  Allergies  Allergen Reactions   Penicillins Hives and Rash   Sulfa Antibiotics Hives and Rash   Bactrim [Sulfamethoxazole-Trimethoprim] Hives and Rash   Codeine Nausea And Vomiting    MEDICATIONS:  Current Outpatient Medications  Medication Sig Dispense Refill   acetaminophen (TYLENOL) 500 MG tablet Take 1,500 mg by mouth as needed. Alternates with Ibuprofen (Patient not taking: Reported on 01/19/2022)     allopurinol (ZYLOPRIM) 300 MG tablet Take 300 mg by mouth daily.     atorvastatin (LIPITOR) 10 MG tablet Take 10 mg by mouth daily.     benazepril (LOTENSIN) 20 MG tablet Take 20 mg by mouth daily. Alternating days with Benazepril-HCTZ     benazepril-hydrochlorthiazide (LOTENSIN HCT) 20-12.5 MG tablet Take by mouth.     cetirizine (ZYRTEC) 10 MG tablet Take 10 mg by mouth as needed for allergies.     diclofenac sodium (VOLTAREN) 1 % GEL Apply 4 g topically 4 (four) times daily as needed (arthritis).     Ibuprofen 200 MG CAPS Take 400 mg by mouth as needed. Alternates with Tylenol (Patient not taking: Reported on 01/19/2022)     levothyroxine (SYNTHROID, LEVOTHROID) 75 MCG tablet Take 75 mcg by mouth daily before breakfast.     metoprolol succinate (TOPROL-XL) 50 MG 24 hr tablet Take 50 mg by mouth daily. Take with or immediately following a meal.     No current facility-administered medications for this encounter.    REVIEW OF SYSTEMS: A 10+ POINT REVIEW OF SYSTEMS WAS OBTAINED including neurology, dermatology, psychiatry, cardiac, respiratory, lymph, extremities, GI, GU, musculoskeletal, constitutional, reproductive, HEENT. On the provided form, she reports ***. She denies *** and any other symptoms.    PHYSICAL EXAM:  vitals were not taken for this visit.  {may need to copy over vitals} Lungs are clear to auscultation bilaterally. Heart has regular rate and rhythm. No  palpable cervical, supraclavicular, or axillary adenopathy. Abdomen soft, non-tender, normal bowel sounds. Breast: *** breast with no palpable mass, nipple discharge, or bleeding. *** breast with ***.   KPS = ***  100 - Normal; no complaints; no evidence of disease. 90   - Able to carry on normal activity; minor signs or symptoms of disease. 80   - Normal activity with effort; some signs or symptoms of disease. 19   - Cares for self; unable to carry on normal activity or to do active work. 60   - Requires occasional assistance, but is able to care for most of his personal needs. 50   - Requires considerable assistance and frequent medical care. 32   - Disabled; requires special care and assistance. 30   -  Severely disabled; hospital admission is indicated although death not imminent. 20   - Very sick; hospital admission necessary; active supportive treatment necessary. 10   - Moribund; fatal processes progressing rapidly. 0     - Dead  Karnofsky DA, Abelmann [place], Craver LS and [place] JH [phone]) The use of the nitrogen mustards in the palliative treatment of carcinoma: with particular reference to bronchogenic carcinoma Cancer 1 [id number]  LABORATORY DATA:  Lab Results  Component Value Date   WBC 6.4 01/19/2022   HGB 14.4 01/19/2022   HCT 43.8 01/19/2022   MCV 92.6 01/19/2022   PLT 165 01/19/2022   Lab Results  Component Value Date   NA 143 01/19/2022   K 4.6 01/19/2022   CL 106 01/19/2022   CO2 31 01/19/2022   Lab Results  Component Value Date   ALT 16 01/19/2022   AST 21 01/19/2022   ALKPHOS 63 11/08/2016   BILITOT 0.5 01/19/2022    PULMONARY FUNCTION TEST:   Review Flowsheet        No data to display          RADIOGRAPHY: [place] RT BREAST BX W LOC DEV 1ST LESION IMG BX SPEC US GUIDE  Addendum Date: 03/15/2022   ADDENDUM REPORT: 03/15/2022 15:27 ADDENDUM: Pathology revealed GRADE 2 INVASIVE DUCTAL CARCINOMA of the RIGHT breast, 12 o'clock, 3 cmfn (ribbon clip). This  was found to be concordant by Dr. Franki Cabot. Pathology results were discussed with the patient and spouse Acupuncturist) by telephone. The patient reported doing well after the biopsy with tenderness and bruising at the site. Post biopsy instructions and care were reviewed and questions were answered. The patient was encouraged to call [facility] for any additional concerns. The patient was referred to [facility] on March 24, 2022. Pathology results reported by Stacie Acres RN on 03/15/2022. Electronically Signed   By: Franki Cabot M.D.   On: 03/15/2022 15:27   Result Date: 03/15/2022 CLINICAL DATA:  Patient presents today for ultrasound-guided biopsy of a RIGHT breast mass at the 12 o'clock axis. EXAM: ULTRASOUND GUIDED RIGHT BREAST CORE NEEDLE BIOPSY COMPARISON:  Previous exams including diagnostic mammogram and ultrasound dated 03/05/2022. PROCEDURE: I met with the patient and we discussed the procedure of ultrasound-guided biopsy, including benefits and alternatives. We discussed the high likelihood of a successful procedure. We discussed the risks of the procedure, including infection, bleeding, tissue injury, clip migration, and inadequate sampling. Informed written consent was given. The usual time-out protocol was performed immediately prior to the procedure. Lesion quadrant: 12 o'clock Using sterile technique and 1% Lidocaine as local anesthetic, under direct ultrasound visualization, a 12 gauge spring-loaded device was used to perform biopsy of the RIGHT breast mass at 12 o'clock axis using a inferomedial approach. At the conclusion of the procedure , a ribbon shaped tissue marker clip was deployed into the biopsy cavity. Follow up 2 view mammogram was performed and dictated separately. IMPRESSION: Ultrasound guided biopsy of the RIGHT breast mass at the 12 o'clock axis. No apparent complications.  Electronically Signed: By: Franki Cabot M.D. On: 03/12/2022 09:21  MM CLIP PLACEMENT RIGHT  Result Date: 03/12/2022 CLINICAL DATA:  Status post ultrasound-guided biopsy for a mass with distortion in the RIGHT breast at the 12 o'clock axis. EXAM: 3D DIAGNOSTIC RIGHT MAMMOGRAM POST ULTRASOUND BIOPSY COMPARISON:  Previous exam(s). FINDINGS: 3D Mammographic images were obtained following ultrasound guided biopsy of the RIGHT breast  mass with distortion at the 12 o'clock axis. The biopsy marking clip is in expected position at the site of biopsy. IMPRESSION: Appropriate positioning of the ribbon shaped biopsy marking clip at the site of biopsy in the upper RIGHT breast, corresponding to the targeted mass at the 12 o'clock axis, and corresponding to the initial mammographic finding of mass/distortion in the upper RIGHT breast. Final Assessment: Post Procedure Mammograms for Marker Placement Electronically Signed   By: Franki Cabot M.D.   On: 03/12/2022 09:30  MM DIAG BREAST TOMO UNI RIGHT  Result Date: 03/05/2022 CLINICAL DATA:  Possible asymmetry in the 12 o'clock position of the right breast at recent screening mammography. EXAM: DIGITAL DIAGNOSTIC UNILATERAL RIGHT MAMMOGRAM WITH TOMOSYNTHESIS; ULTRASOUND RIGHT BREAST LIMITED TECHNIQUE: Right digital diagnostic mammography and breast tomosynthesis was performed.; Targeted ultrasound examination of the right breast was performed COMPARISON:  Previous exam(s). ACR Breast Density Category b: There are scattered areas of fibroglandular density. FINDINGS: 3D tomographic and 2D generated spot compression images of the right breast were obtained. In the oblique projection, normal appearing fibroglandular tissue is demonstrated at the location of the recently suspected asymmetry. In the craniocaudal projection, there is a persistent, less well-defined asymmetry in that area. This is irregular in shape with possible associated distortion. On physical exam, no mass is  palpable in the 12 o'clock position of the right breast or in the right axilla. Targeted ultrasound is performed, showing a 1.2 x 0.8 x 0.5 cm irregular mass in the 12 o'clock position of the right breast, 3 cm from the nipple. This corresponds to the mammographic asymmetry with distortion. Ultrasound of the right axilla demonstrated normal appearing right axillary nodes IMPRESSION: 1.2 cm mass in the 12 o'clock position of the right breast, suspicious for malignancy. RECOMMENDATION: Ultrasound-guided core needle biopsy of the 1.2 cm mass in the 12 o'clock position of the right breast. This has been discussed with the patient and scheduled at 8:30 a.m. on 03/12/2022. I have discussed the findings and recommendations with the patient. If applicable, a reminder letter will be sent to the patient regarding the next appointment. BI-RADS CATEGORY  4: Suspicious. Electronically Signed   By: Claudie Revering M.D.   On: 03/05/2022 13:53  US BREAST LTD UNI RIGHT INC AXILLA  Result Date: 03/05/2022 CLINICAL DATA:  Possible asymmetry in the 12 o'clock position of the right breast at recent screening mammography. EXAM: DIGITAL DIAGNOSTIC UNILATERAL RIGHT MAMMOGRAM WITH TOMOSYNTHESIS; ULTRASOUND RIGHT BREAST LIMITED TECHNIQUE: Right digital diagnostic mammography and breast tomosynthesis was performed.; Targeted ultrasound examination of the right breast was performed COMPARISON:  Previous exam(s). ACR Breast Density Category b: There are scattered areas of fibroglandular density. FINDINGS: 3D tomographic and 2D generated spot compression images of the right breast were obtained. In the oblique projection, normal appearing fibroglandular tissue is demonstrated at the location of the recently suspected asymmetry. In the craniocaudal projection, there is a persistent, less well-defined asymmetry in that area. This is irregular in shape with possible associated distortion. On physical exam, no mass is palpable in the 12 o'clock  position of the right breast or in the right axilla. Targeted ultrasound is performed, showing a 1.2 x 0.8 x 0.5 cm irregular mass in the 12 o'clock position of the right breast, 3 cm from the nipple. This corresponds to the mammographic asymmetry with distortion. Ultrasound of the right axilla demonstrated normal appearing right axillary nodes IMPRESSION: 1.2 cm mass in the 12 o'clock position of the right breast, suspicious for malignancy. RECOMMENDATION:  Ultrasound-guided core needle biopsy of the 1.2 cm mass in the 12 o'clock position of the right breast. This has been discussed with the patient and scheduled at 8:30 a.m. on 03/12/2022. I have discussed the findings and recommendations with the patient. If applicable, a reminder letter will be sent to the patient regarding the next appointment. BI-RADS CATEGORY  4: Suspicious. Electronically Signed   By: Claudie Revering M.D.   On: 03/05/2022 13:53     IMPRESSION: ***   Patient will be a good candidate for breast conservation with radiotherapy to the right breast. We discussed the general course of radiation, potential side effects, and toxicities with radiation and the patient is interested in this approach. ***   PLAN:  ***   ------------------------------------------------  Blair Promise, PhD, MD  This document serves as a record of services personally performed by Gery Pray, MD. It was created on his behalf by Roney Mans, a trained medical scribe. The creation of this record is based on the scribe's personal observations and the provider's statements to them. This document has been checked and approved by the attending provider.

## 2022-03-24 ENCOUNTER — Encounter: Payer: Self-pay | Admitting: Genetic Counselor

## 2022-03-24 ENCOUNTER — Ambulatory Visit
Admission: RE | Admit: 2022-03-24 | Discharge: 2022-03-24 | Disposition: A | Discharge status: Home / Self Care | Payer: Medicare PPO | Source: Ambulatory Visit | Attending: Radiation Oncology | Admitting: Radiation Oncology

## 2022-03-24 ENCOUNTER — Encounter: Payer: Self-pay | Admitting: *Deleted

## 2022-03-24 ENCOUNTER — Ambulatory Visit: Payer: Medicare PPO | Attending: General Surgery | Admitting: Physical Therapy

## 2022-03-24 ENCOUNTER — Inpatient Hospital Stay: Payer: Medicare PPO | Attending: Hematology and Oncology | Admitting: Hematology and Oncology

## 2022-03-24 ENCOUNTER — Inpatient Hospital Stay: Payer: Medicare PPO

## 2022-03-24 ENCOUNTER — Other Ambulatory Visit: Payer: Self-pay

## 2022-03-24 ENCOUNTER — Encounter: Payer: Self-pay | Admitting: Physical Therapy

## 2022-03-24 ENCOUNTER — Inpatient Hospital Stay: Payer: Medicare PPO | Admitting: Licensed Clinical Social Worker

## 2022-03-24 ENCOUNTER — Ambulatory Visit: Payer: Self-pay | Admitting: General Surgery

## 2022-03-24 ENCOUNTER — Inpatient Hospital Stay (HOSPITAL_BASED_OUTPATIENT_CLINIC_OR_DEPARTMENT_OTHER): Payer: Medicare PPO | Admitting: Genetic Counselor

## 2022-03-24 VITALS — BP 108/78 | HR 55 | Temp 98.2°F | Resp 18 | Ht 64.5 in | Wt 171.1 lb

## 2022-03-24 DIAGNOSIS — Z170 Estrogen receptor positive status [ER+]: Secondary | ICD-10-CM | POA: Diagnosis not present

## 2022-03-24 DIAGNOSIS — C50411 Malignant neoplasm of upper-outer quadrant of right female breast: Secondary | ICD-10-CM

## 2022-03-24 DIAGNOSIS — Z8042 Family history of malignant neoplasm of prostate: Secondary | ICD-10-CM

## 2022-03-24 DIAGNOSIS — Z800 Family history of malignant neoplasm of digestive organs: Secondary | ICD-10-CM

## 2022-03-24 DIAGNOSIS — Z803 Family history of malignant neoplasm of breast: Secondary | ICD-10-CM | POA: Diagnosis not present

## 2022-03-24 DIAGNOSIS — R293 Abnormal posture: Secondary | ICD-10-CM | POA: Diagnosis not present

## 2022-03-24 DIAGNOSIS — C50211 Malignant neoplasm of upper-inner quadrant of right female breast: Secondary | ICD-10-CM | POA: Insufficient documentation

## 2022-03-24 HISTORY — DX: Family history of malignant neoplasm of breast: Z80.3

## 2022-03-24 HISTORY — DX: Family history of malignant neoplasm of prostate: Z80.42

## 2022-03-24 HISTORY — DX: Family history of malignant neoplasm of digestive organs: Z80.0

## 2022-03-24 LAB — CBC WITH DIFFERENTIAL (CANCER CENTER ONLY)
Abs Immature Granulocytes: 0.01 10*3/uL (ref 0.00–0.07)
Basophils Absolute: 0 10*3/uL (ref 0.0–0.1)
Basophils Relative: 0 %
Eosinophils Absolute: 0.4 10*3/uL (ref 0.0–0.5)
Eosinophils Relative: 6 %
HCT: 42.7 % (ref 36.0–46.0)
Hemoglobin: 14.2 g/dL (ref 12.0–15.0)
Immature Granulocytes: 0 %
Lymphocytes Relative: 22 %
Lymphs Abs: 1.5 10*3/uL (ref 0.7–4.0)
MCH: 30.5 pg (ref 26.0–34.0)
MCHC: 33.3 g/dL (ref 30.0–36.0)
MCV: 91.6 fL (ref 80.0–100.0)
Monocytes Absolute: 0.5 10*3/uL (ref 0.1–1.0)
Monocytes Relative: 8 %
Neutro Abs: 4.2 10*3/uL (ref 1.7–7.7)
Neutrophils Relative %: 64 %
Platelet Count: 158 10*3/uL (ref 150–400)
RBC: 4.66 MIL/uL (ref 3.87–5.11)
RDW: 13.6 % (ref 11.5–15.5)
WBC Count: 6.6 10*3/uL (ref 4.0–10.5)
nRBC: 0 % (ref 0.0–0.2)

## 2022-03-24 LAB — CMP (CANCER CENTER ONLY)
ALT: 22 U/L (ref 0–44)
AST: 23 U/L (ref 15–41)
Albumin: 4.1 g/dL (ref 3.5–5.0)
Alkaline Phosphatase: 69 U/L (ref 38–126)
Anion gap: 4 — ABNORMAL LOW (ref 5–15)
BUN: 25 mg/dL — ABNORMAL HIGH (ref 8–23)
CO2: 29 mmol/L (ref 22–32)
Calcium: 9.2 mg/dL (ref 8.9–10.3)
Chloride: 109 mmol/L (ref 98–111)
Creatinine: 1.22 mg/dL — ABNORMAL HIGH (ref 0.44–1.00)
GFR, Estimated: 49 mL/min — ABNORMAL LOW (ref >60)
Glucose, Bld: 104 mg/dL — ABNORMAL HIGH (ref 70–99)
Potassium: 4.2 mmol/L (ref 3.5–5.1)
Sodium: 142 mmol/L (ref 135–145)
Total Bilirubin: 0.7 mg/dL (ref 0.3–1.2)
Total Protein: 7.4 g/dL (ref 6.5–8.1)

## 2022-03-24 LAB — GENETIC SCREENING ORDER

## 2022-03-24 LAB — RESEARCH LABS

## 2022-03-24 NOTE — Progress Notes (Signed)
[facility]  Initial Assessment   Jo CASTRONOVA is a 66 y.o. year old female accompanied by spouse, Event organiser. Clinical Social Work was referred by  [facility]  for assessment of psychosocial needs.   SDOH (Social Determinants of Health) assessments performed: Yes SDOH Interventions    Flowsheet Row Clinical Support from 03/24/2022 in [facility]  SDOH Interventions   Food Insecurity Interventions Intervention Not Indicated  Housing Interventions Intervention Not Indicated  Transportation Interventions Intervention Not Indicated  Utilities Interventions Intervention Not Indicated  Financial Strain Interventions Intervention Not Indicated       SDOH Screenings   Food Insecurity: No Food Insecurity (03/24/2022)  Housing: Low Risk  (03/24/2022)  Transportation Needs: No Transportation Needs (03/24/2022)  Utilities: Not At Risk (03/24/2022)  Financial Resource Strain: Low Risk  (03/24/2022)  Tobacco Use: Low Risk  (03/24/2022)     Distress Screen completed: Yes    03/24/2022   11:23 AM  ONCBCN DISTRESS SCREENING  Screening Type Initial Screening  Distress experienced in past week (1-10) 2  Emotional problem type Adjusting to illness;Adjusting to appearance changes  Information Concerns Type Lack of info about diagnosis;Lack of info about treatment;Lack of info about complementary therapy choices;Lack of info about maintaining fitness      Family/Social Information:  Housing Arrangement: patient lives with spouse, [name] . Daughter, Danton Clap, lives nearby. Son, Shanon Brow, lives in [place]- plans to visit over Christmas Family members/support persons in your life? 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 Husband is also retired Pharmacist, hospital.  Income source: Paediatric nurse concerns: No Type of concern: None Food access concerns: no Religious or  spiritual practice: Photographer with the [employer] Currently in place:    Coping/ Adjustment to diagnosis: Patient understands treatment plan and what happens next? yes, feels better and has good understanding of the plan after meeting with medical team Concerns about diagnosis and/or treatment:  general adjustment to diagnosis Patient reported stressors: Adjusting to my illness Patient enjoys reading, time with family/ friends, and being around the house, cooking, pilates Current coping skills/ strengths: Capable of independent living , Armed forces logistics/support/administrative officer , Motivation for treatment/growth , Religious Affiliation , Special hobby/interest , and Supportive family/friends     SUMMARY: Current SDOH Barriers:  None noted today  Clinical Social Work Clinical Goal(s):  No clinical social work goals at this time  Interventions: Discussed common feeling and emotions when being diagnosed with cancer, and the importance of support during treatment Informed patient of the support team roles and support services at [facility] Provided [facility] contact information and encouraged patient to call with any questions or concerns   Follow Up Plan: Patient will contact CSW with any support or resource needs Patient verbalizes understanding of plan: Yes    Phelan Goers E Kensley Valladares, LCSW

## 2022-03-24 NOTE — Progress Notes (Signed)
REFERRING PROVIDER: Nicholas Lose, MD [place],  [place] [id number]  PRIMARY PROVIDER:  [name], [name], MD  PRIMARY REASON FOR VISIT:  1. Malignant neoplasm of upper-outer quadrant of right breast in female, estrogen receptor positive ([place])   2. Family history of breast cancer   3. Family history of prostate cancer   4. Family history of colon cancer     HISTORY OF PRESENT ILLNESS:   Ms. Warnke, a 66 y.o. female, was seen for a [facility] cancer genetics consultation at the request of Dr. Lindi Adie due to a personal history of breast cancer.  Ms. Dymek presents to clinic today to discuss the possibility of a hereditary predisposition to cancer, to discuss genetic testing, and to further clarify her future cancer risks, as well as potential cancer risks for family members.   In 2023, at the age of 66, Ms. Vicars was diagnosed with invasive ductal carcinoma of the right breast (ER+/PR+/HER2-). The treatment plan is pending.      CANCER HISTORY:  Oncology History  Malignant neoplasm of upper-outer quadrant of right breast in female, estrogen receptor positive ([place])  03/12/2022 Initial Diagnosis   Screening mammogram detected right breast asymmetry at 12 o'clock position by ultrasound measured 1.2 cm, axilla negative, biopsy revealed grade 2 IDC ER 95% PR 90% HER2 equivalent FISH negative ratio 1.17, Ki-67 20%   03/24/2022 Cancer Staging   Staging form: Breast, AJCC 8th Edition - Clinical stage from 03/24/2022: Stage IA (cT1, cN0, cM0, G2, ER+, PR+, HER2-) - Signed by Nicholas Lose, MD on 03/24/2022 Stage prefix: Initial diagnosis Histologic grading system: 3 grade system Laterality: Right Staged by: Pathologist and managing physician Stage used in treatment planning: Yes National guidelines used in treatment planning: Yes Type of national guideline used in treatment planning: NCCN       Past Medical History:  Diagnosis Date   Anemia     Chronic kidney disease    uric acid kidney stones   Family history of breast cancer 03/24/2022   Family history of colon cancer 03/24/2022   Family history of prostate cancer 03/24/2022   GERD (gastroesophageal reflux disease)    past hx - uses ranitadine OTC prn only    Gout    Hyperlipidemia    Hypertension    Lumbar spine pain    Neuromuscular disorder ([place])    hiatal hernia   Osteoarthritis    thumbs, big toe left foot, top both feet   PONV (postoperative nausea and vomiting)    Thyroid disease     Past Surgical History:  Procedure Laterality Date   ABLATION     COLONOSCOPY  2018   KN-MAC-suprep(exc)-tics   DILATION AND CURETTAGE OF UTERUS     no anesthesia during this procedure   EYE SURGERY Bilateral 12/2017   eyelid surgery    SHOULDER SURGERY Right    repair of tear     FAMILY HISTORY:  We obtained a detailed, 4-generation family history.  Significant diagnoses are listed below: Family History  Problem Relation Age of Onset   Breast cancer Maternal Aunt        dx 22s   Prostate cancer Maternal Uncle        mets; d. 51s   Colon cancer Cousin        maternal cousins x4; one dx before age 89; others dx after age 105     Ms. Sundstrom is unaware of previous family history of genetic testing for hereditary cancer risks.There  is no reported Ashkenazi Jewish ancestry. There is no known consanguinity.  GENETIC COUNSELING ASSESSMENT: Ms. Bartosiewicz is a 66 y.o. female with a personal and family history which is somewhat suggestive of a hereditary cancer syndrome and predisposition to cancer given the presence of related cancers in the family. We, therefore, discussed and recommended the following at today's visit.   DISCUSSION: We discussed that 5 - 10% of cancer is hereditary.  Most cases of hereditary breast cancer are associated with mutations in BRCA1/2.  There are other genes that can be associated with hereditary breast, prostate, or colon cancer syndromes.  We  discussed that testing is beneficial for several reasons including knowing how to follow individuals for their cancer risks, identifying whether potential treatment options would be beneficial, and understanding if other family members could be at risk for cancer and allowing them to undergo genetic testing.   We reviewed the characteristics, features and inheritance patterns of hereditary cancer syndromes. We also discussed genetic testing, including the appropriate family members to test, the process of testing, insurance coverage and turn-around-time for results. We discussed the implications of a negative, positive, carrier and/or variant of uncertain significant result. We recommended Ms. Hollyfield pursue genetic testing for a panel that includes genes associated with breast, prostate, colon and other cancers.   The CustomNext-Cancer+RNAinsight panel offered by [facility] includes sequencing and rearrangement analysis for the following 47 genes:  APC, ATM, AXIN2, BARD1, BMPR1A, BRCA1, BRCA2, BRIP1, CDH1, CDK4, CDKN2A, CHEK2, DICER1, EPCAM, GREM1, HOXB13, MEN1, MLH1, MSH2, MSH3, MSH6, MUTYH, NBN, NF1, NF2, NTHL1, PALB2, PMS2, POLD1, POLE, PTEN, RAD51C, RAD51D, RECQL, RET, SDHA, SDHAF2, SDHB, SDHC, SDHD, SMAD4, SMARCA4, STK11, TP53, TSC1, TSC2, and VHL.  RNA data is routinely analyzed for use in variant interpretation for all genes.  Based on Ms. Morozov's personal and family history of cancer, she meets medical criteria for genetic testing. Despite that she meets criteria, she may still have an out of pocket cost. We discussed that if her out of pocket cost for testing is over $100, the laboratory should contact her and discuss the self-pay prices and/or patient pay assistance programs.    PLAN: After considering the risks, benefits, and limitations, Ms. Fennema provided informed consent to pursue genetic testing and the blood sample was sent to [facility] for analysis of the  CustomNext-Cancer +RNAinsight Panel. Results should be available within approximately 3 weeks' time, at which point they will be disclosed by telephone to Ms. Colden, as will any additional recommendations warranted by these results. Ms. Bolinger will receive a summary of her genetic counseling visit and a copy of her results once available. This information will also be available in Epic.     Ms. Stoltz's questions were answered to her satisfaction today. Our contact information was provided should additional questions or concerns arise. Thank you for the referral and allowing [place] to share in the care of your patient.   Ashar Lewinski M. Joette Catching, [place], [facility] Genetic Counselor [email or web address] (P) [phone]  The patient was seen for a total of 12 minutes in face-to-face genetic counseling.  She was accompanied by her husband.  Drs. Lindi Adie and/or Burr Medico were available to discuss this case as needed.    _______________________________________________________________________ For Office Staff:  Number of people involved in session: 2 Was an Intern/ student involved with case: no

## 2022-03-24 NOTE — Research (Signed)
Trial: [employer] 2021-05 - Specimen Collection Study to Evaluate Biomarkers in Subjects with Cancer   Patient Jo Garcia was identified by Dr Lindi Adie as a potential candidate for the above listed study.  This Clinical Research Nurse met with Jo Garcia, [id number], on 03/24/22 in a manner and location that ensures patient privacy to discuss participation in the above listed research study.  Patient is Accompanied by her husband .  A copy of the informed consent document with embedded HIPAA language was provided to the patient.  Patient reads, speaks, and understands [place].    Patient was provided with the business card of this Nurse and encouraged to contact the research team with any questions.  Patient was provided the option of taking informed consent documents home to review and was encouraged to review at their convenience with their support network, including other care providers. Patient is comfortable with making a decision regarding study participation today.  As outlined in the informed consent form, this Nurse and Ivonne Andrew discussed the purpose of the research study, the investigational nature of the study, study procedures and requirements for study participation, potential risks and benefits of study participation, as well as alternatives to participation. This study is not blinded. The patient understands participation is voluntary and they may withdraw from study participation at any time.  This study does not involve randomization.  This study does not involve an investigational drug or device. This study does not involve a placebo. Patient understands enrollment is pending full eligibility review.   Confidentiality and how the patient's information will be used as part of study participation were discussed.  Patient was informed there is reimbursement provided for their time and effort spent on trial participation.  The patient is encouraged to discuss  research study participation with their insurance provider to determine what costs they may incur as part of study participation, including research related injury.    All questions were answered to patient's satisfaction.  The informed consent with embedded HIPAA language was reviewed page by page.  The patient's mental and emotional status is appropriate to provide informed consent, and the patient verbalizes an understanding of study participation.  Patient has agreed to participate in the above listed research study and has voluntarily signed the informed consent date 08 Jun 2021 with embedded HIPAA language, version dated 30Jan2023  on 03/24/22 at 1130AM.  The patient was provided with a copy of the signed informed consent form with embedded HIPAA language for their reference.  No study specific procedures were obtained prior to the signing of the informed consent document.  Approximately 20 minutes were spent with the patient reviewing the informed consent documents.  Patient was not requested to complete a Release of Information form.  Marjie Skiff Leviathan Macera, RN, BSN, [facility] She  Her  Hers Clinical Research Nurse [facility] Direct Dial [phone]  Pager [phone] 03/24/2022 1:50 PM

## 2022-03-24 NOTE — Progress Notes (Signed)
[place] NOTE  Patient Care Team: [name], [name], MD as PCP - General (Internal Medicine) Mauro Kaufmann, RN as Oncology Nurse Navigator Rockwell Germany, RN as Oncology Nurse Navigator Nicholas Lose, MD as Consulting Physician (Hematology and Oncology) Gery Pray, MD as Consulting Physician (Radiation Oncology) Jovita Kussmaul, MD as Consulting Physician (General Surgery)  CHIEF COMPLAINTS/PURPOSE OF CONSULTATION:  Newly diagnosed breast cancer  HISTORY OF PRESENTING ILLNESS:  Jo Garcia 66 y.o. female is here because of recent diagnosis of right breast cancer.  Patient had a screening mammogram that detected a right breast asymmetry at 4 o'clock position measuring 1.2 cm.  Biopsy revealed grade 2 IDC ER/PR positive HER2 negative with a Ki-67 20%.  Patient was presented in the multidisciplinary tumor board and she is here today to discuss treatment plan.  I reviewed her records extensively and collaborated the history with the patient.  SUMMARY OF ONCOLOGIC HISTORY: Oncology History  Malignant neoplasm of upper-outer quadrant of right breast in female, estrogen receptor positive ([place])  03/12/2022 Initial Diagnosis   Screening mammogram detected right breast asymmetry at 12 o'clock position by ultrasound measured 1.2 cm, axilla negative, biopsy revealed grade 2 IDC ER 95% PR 90% HER2 equivalent FISH negative ratio 1.17, Ki-67 20%   03/24/2022 Cancer Staging   Staging form: Breast, AJCC 8th Edition - Clinical stage from 03/24/2022: Stage IA (cT1, cN0, cM0, G2, ER+, PR+, HER2-) - Signed by Nicholas Lose, MD on 03/24/2022 Stage prefix: Initial diagnosis Histologic grading system: 3 grade system Laterality: Right Staged by: Pathologist and managing physician Stage used in treatment planning: Yes National guidelines used in treatment planning: Yes Type of national guideline used in treatment planning: NCCN      MEDICAL HISTORY:  Past Medical  History:  Diagnosis Date   Anemia    Chronic kidney disease    uric acid kidney stones   GERD (gastroesophageal reflux disease)    past hx - uses ranitadine OTC prn only    Gout    Hyperlipidemia    Hypertension    Lumbar spine pain    Neuromuscular disorder ([facility])    hiatal hernia   Osteoarthritis    thumbs, big toe left foot, top both feet   PONV (postoperative nausea and vomiting)    Thyroid disease     SURGICAL HISTORY: Past Surgical History:  Procedure Laterality Date   ABLATION     COLONOSCOPY  2018   KN-MAC-suprep(exc)-tics   DILATION AND CURETTAGE OF UTERUS     no anesthesia during this procedure   EYE SURGERY Bilateral 12/2017   eyelid surgery    SHOULDER SURGERY Right    repair of tear     SOCIAL HISTORY: Social History   Socioeconomic History   Marital status: Married    Spouse name: Event organiser   Number of children: 2   Years of education: Not on file   Highest education level: Master's degree (e.g., MA, MS, MEng, MEd, MSW, MBA)  Occupational History   Not on file  Tobacco Use   Smoking status: Never    Passive exposure: Past   Smokeless tobacco: Never  Vaping Use   Vaping Use: Never used  Substance and Sexual Activity   Alcohol use: Never   Drug use: Never   Sexual activity: Not on file  Other Topics Concern   Not on file  Social History Narrative   Lives with husband   Right handed   Caffeine: occasionally tea or coke  Social Determinants of Health   Financial Resource Strain: Low Risk  (03/24/2022)   Overall Financial Resource Strain (CARDIA)    Difficulty of Paying Living Expenses: Not very hard  Food Insecurity: No Food Insecurity (03/24/2022)   Hunger Vital Sign    Worried About Running Out of Food in the Last Year: Never true    Ran Out of Food in the Last Year: Never true  Transportation Needs: No Transportation Needs (03/24/2022)   PRAPARE - Hydrologist (Medical): No    Lack of Transportation  (Non-Medical): No  Physical Activity: Not on file  Stress: Not on file  Social Connections: Not on file  Intimate Partner Violence: Not on file    FAMILY HISTORY: Family History  Problem Relation Age of Onset   Rheum arthritis Mother    Tuberculosis Mother        "spinal tuberculosis"?   Heart attack Father    Colon cancer Cousin    Rheum arthritis Daughter    Hypertension Daughter    Healthy Son    Colon polyps Neg Hx    Esophageal cancer Neg Hx    Rectal cancer Neg Hx    Stomach cancer Neg Hx     ALLERGIES:  is allergic to penicillins, sulfa antibiotics, bactrim [sulfamethoxazole-trimethoprim], and codeine.  MEDICATIONS:  Current Outpatient Medications  Medication Sig Dispense Refill   acetaminophen (TYLENOL) 500 MG tablet Take 1,500 mg by mouth as needed. Alternates with Ibuprofen     allopurinol (ZYLOPRIM) 300 MG tablet Take 300 mg by mouth daily.     atorvastatin (LIPITOR) 10 MG tablet Take 10 mg by mouth daily.     benazepril (LOTENSIN) 20 MG tablet Take 20 mg by mouth daily. Alternating days with Benazepril-HCTZ     Ibuprofen 200 MG CAPS Take 400 mg by mouth as needed. Alternates with Tylenol     levothyroxine (SYNTHROID, LEVOTHROID) 75 MCG tablet Take 75 mcg by mouth daily before breakfast.     metoprolol succinate (TOPROL-XL) 50 MG 24 hr tablet Take 50 mg by mouth daily. Take with or immediately following a meal.     benazepril-hydrochlorthiazide (LOTENSIN HCT) 20-12.5 MG tablet Take by mouth.     diclofenac sodium (VOLTAREN) 1 % GEL Apply 4 g topically 4 (four) times daily as needed (arthritis).     No current facility-administered medications for this visit.    REVIEW OF SYSTEMS:   Constitutional: Denies fevers, chills or abnormal night sweats Breast:  Denies any palpable lumps or discharge All other systems were reviewed with the patient and are negative.  PHYSICAL EXAMINATION: ECOG PERFORMANCE STATUS: 1 - Symptomatic but completely ambulatory  Vitals:    03/24/22 0904  BP: 108/78  Pulse: (!) 55  Resp: 18  Temp: 98.2 F (36.8 C)  SpO2: 99%   Filed Weights   03/24/22 0904  Weight: 171 lb 1.6 oz (77.6 kg)    GENERAL:alert, no distress and comfortable    LABORATORY DATA:  I have reviewed the data as listed Lab Results  Component Value Date   WBC 6.6 03/24/2022   HGB 14.2 03/24/2022   HCT 42.7 03/24/2022   MCV 91.6 03/24/2022   PLT 158 03/24/2022   Lab Results  Component Value Date   NA 142 03/24/2022   K 4.2 03/24/2022   CL 109 03/24/2022   CO2 29 03/24/2022    RADIOGRAPHIC STUDIES: I have personally reviewed the radiological reports and agreed with the findings in the report.  ASSESSMENT AND PLAN:  Malignant neoplasm of upper-outer quadrant of right breast in female, estrogen receptor positive ([place]) 03/12/2022:Screening mammogram detected right breast asymmetry at 12 o'clock position by ultrasound measured 1.2 cm, axilla negative, biopsy revealed grade 2 IDC ER 95% PR 90% HER2 equivalent FISH negative ratio 1.17, Ki-67 20%  Pathology and radiology counseling:Discussed with the patient, the details of pathology including the type of breast cancer,the clinical staging, the significance of ER, PR and HER-2/neu receptors and the implications for treatment. After reviewing the pathology in detail, we proceeded to discuss the different treatment options between surgery, radiation, chemotherapy, antiestrogen therapies.  Recommendations: 1. Breast conserving surgery followed by 2. Oncotype DX testing to determine if chemotherapy would be of any benefit followed by 3. Adjuvant radiation therapy followed by 4. Adjuvant antiestrogen therapy  Oncotype counseling: I discussed Oncotype DX test. I explained to the patient that this is a 21 gene panel to evaluate patient tumors DNA to calculate recurrence score. This would help determine whether patient has high risk or low risk breast cancer. She understands that if her tumor was found  to be high risk, she would benefit from systemic chemotherapy. If low risk, no need of chemotherapy.  Return to clinic after surgery to discuss final pathology report and then determine if Oncotype DX testing will need to be sent.   All questions were answered. The patient knows to call the clinic with any problems, questions or concerns.    Harriette Ohara, MD 03/24/22

## 2022-03-24 NOTE — Research (Signed)
[employer] 2021-05 - Specimen Collection Study to Evaluate Biomarkers in Subjects with Cancer   This Nurse has reviewed this patient's inclusion and exclusion criteria as a second review and confirms Jo Garcia is eligible for study participation. Patient may continue with enrollment.  Vickii Penna, RN, BSN, CPN Clinical Research Nurse I [phone]  03/24/2022 11:40 AM

## 2022-03-24 NOTE — Research (Signed)
[employer] 2021-05 - Specimen Collection Study to Evaluate Biomarkers in Subjects with Cancer   This Nurse has reviewed this patient's inclusion and exclusion criteria and confirmed Jo Garcia is eligible for study participation.  Patient will continue with enrollment.  Eligibility confirmed by treating investigator, who also agrees that patient should proceed with enrollment.   Medical History:  High Blood Pressure  Yes Coronary Artery Disease No Lupus    No Rheumatoid Arthritis  No Diabetes   No      Lynch Syndrome  No  Is the patient currently taking a magnesium supplement?   No  Does the patient have a personal history of cancer (greater than 5 years ago)?  No  Does the patient have a family history of cancer in 1st or 2nd degree relatives? Yes If yes, Relationship(s) and Cancer type(s)? Aunt; dx with lump and had mastectomy  Does the patient have history of alcohol consumption? No    Does the patient have history of cigarette, cigar, pipe, or chewing tobacco use?  No   Melrose Kearse G. Robertt Buda, RN, BSN, [facility] She  Her  Hers Clinical Research Nurse [facility] Direct Dial [phone]  Pager [phone] 03/24/2022 11:38 AM

## 2022-03-24 NOTE — Assessment & Plan Note (Signed)
03/12/2022:Screening mammogram detected right breast asymmetry at 12 o'clock position by ultrasound measured 1.2 cm, axilla negative, biopsy revealed grade 2 IDC ER 95% PR 90% HER2 equivalent FISH negative ratio 1.17, Ki-67 20%  Pathology and radiology counseling:Discussed with the patient, the details of pathology including the type of breast cancer,the clinical staging, the significance of ER, PR and HER-2/neu receptors and the implications for treatment. After reviewing the pathology in detail, we proceeded to discuss the different treatment options between surgery, radiation, chemotherapy, antiestrogen therapies.  Recommendations: 1. Breast conserving surgery followed by 2. Oncotype DX testing to determine if chemotherapy would be of any benefit followed by 3. Adjuvant radiation therapy followed by 4. Adjuvant antiestrogen therapy  Oncotype counseling: I discussed Oncotype DX test. I explained to the patient that this is a 21 gene panel to evaluate patient tumors DNA to calculate recurrence score. This would help determine whether patient has high risk or low risk breast cancer. She understands that if her tumor was found to be high risk, she would benefit from systemic chemotherapy. If low risk, no need of chemotherapy.  Return to clinic after surgery to discuss final pathology report and then determine if Oncotype DX testing will need to be sent.  

## 2022-03-24 NOTE — Therapy (Signed)
OUTPATIENT PHYSICAL THERAPY BREAST CANCER BASELINE EVALUATION   Patient Name: Jo Garcia MRN: [id number] DOB:March 15, 1956, 66 y.o., female Today's Date: 03/24/2022   PT End of Session - 03/24/22 1051     Visit Number 1    Number of Visits 2    Date for PT Re-Evaluation 05/19/22    PT Start Time 0933    PT Stop Time 0959    PT Time Calculation (min) 26 min    Activity Tolerance Patient tolerated treatment well    Behavior During Therapy WFL for tasks assessed/performed             Past Medical History:  Diagnosis Date   Anemia    Chronic kidney disease    uric acid kidney stones   GERD (gastroesophageal reflux disease)    past hx - uses ranitadine OTC prn only    Gout    Hyperlipidemia    Hypertension    Lumbar spine pain    Neuromuscular disorder (HCC)    hiatal hernia   Osteoarthritis    thumbs, big toe left foot, top both feet   PONV (postoperative nausea and vomiting)    Thyroid disease    Past Surgical History:  Procedure Laterality Date   ABLATION     COLONOSCOPY  2018   KN-MAC-suprep(exc)-tics   DILATION AND CURETTAGE OF UTERUS     no anesthesia during this procedure   EYE SURGERY Bilateral 12/2017   eyelid surgery    SHOULDER SURGERY Right    repair of tear    Patient Active Problem List   Diagnosis Date Noted   Malignant neoplasm of upper-outer quadrant of right breast in female, estrogen receptor positive ([place]) 03/22/2022   Medial epicondylitis of both elbows 01/18/2020   Dyslipidemia 11/04/2016   Osteoarthritis of foot 04/30/2016   History of hypertension 04/30/2016   History of renal calculi 04/30/2016   Vitamin D deficiency 04/30/2016   History of hypothyroidism 04/30/2016   Primary osteoarthritis of both hands 04/30/2016   Primary osteoarthritis of both knees 04/30/2016   Idiopathic chronic gout of multiple sites without tophus 04/30/2016    REFERRING PROVIDER: Dr. Autumn Messing  REFERRING DIAG: Right breast cancer  THERAPY DIAG:   Malignant neoplasm of upper-inner quadrant of right breast in female, estrogen receptor positive ([place])  Abnormal posture  Rationale for Evaluation and Treatment: Rehabilitation  ONSET DATE: 02/22/2022  SUBJECTIVE:                                                                                                                                                                                           SUBJECTIVE STATEMENT: Patient  reports she is here today to be seen by her medical team for her newly diagnosed right breast cancer.   PERTINENT HISTORY:  Patient was diagnosed on 02/22/2022 with right grade 2 invasive ductal carcinoma breast cancer. It measures 1.2 cm and is located in the upper inner quadrant. It is ER/PR positive and HER2 negative with a Ki67 of 20%.   PATIENT GOALS:   reduce lymphedema risk and learn post op HEP.   PAIN:  Are you having pain? No  PRECAUTIONS: Active CA   HAND DOMINANCE: right  WEIGHT BEARING RESTRICTIONS: No  FALLS:  Has patient fallen in last 6 months? No  LIVING ENVIRONMENT: Patient lives with: her husband Lives in: House/apartment Has following equipment at home: None  OCCUPATION: Retired Conservation officer, historic buildings: She walks daily for an hour and does Pilates once a week with [employer], PTA  PRIOR LEVEL OF FUNCTION: Independent   OBJECTIVE:  COGNITION: Overall cognitive status: Within functional limits for tasks assessed    POSTURE:  Forward head and rounded shoulders posture  UPPER EXTREMITY AROM/PROM:  A/PROM RIGHT   eval   Shoulder extension 58  Shoulder flexion 151  Shoulder abduction 159  Shoulder internal rotation 75  Shoulder external rotation 80    (Blank rows = not tested)  A/PROM LEFT   eval  Shoulder extension 52  Shoulder flexion 154  Shoulder abduction 170  Shoulder internal rotation 68  Shoulder external rotation 88    (Blank rows = not tested)  CERVICAL AROM: All within normal limits   UPPER  EXTREMITY STRENGTH: WNL  LYMPHEDEMA ASSESSMENTS:   LANDMARK RIGHT   eval  10 cm proximal to olecranon process 29.7  Olecranon process 25.5  10 cm proximal to ulnar styloid process 21.4  Just proximal to ulnar styloid process 15.4  Across hand at thumb web space 18.3  At base of 2nd digit 6  (Blank rows = not tested)  LANDMARK LEFT   eval  10 cm proximal to olecranon process 29.3  Olecranon process 24.3  10 cm proximal to ulnar styloid process 21.2  Just proximal to ulnar styloid process 14.8  Across hand at thumb web space 17.6  At base of 2nd digit 5.9  (Blank rows = not tested)  L-DEX LYMPHEDEMA SCREENING:  The patient was assessed using the L-Dex machine today to produce a lymphedema index baseline score. The patient will be reassessed on a regular basis (typically every 3 months) to obtain new L-Dex scores. If the score is > 6.5 points away from his/her baseline score indicating onset of subclinical lymphedema, it will be recommended to wear a compression garment for 4 weeks, 12 hours per day and then be reassessed. If the score continues to be > 6.5 points from baseline at reassessment, we will initiate lymphedema treatment. Assessing in this manner has a 95% rate of preventing clinically significant lymphedema.   L-DEX FLOWSHEETS - 03/24/22 1000       L-DEX LYMPHEDEMA SCREENING   Measurement Type Unilateral    L-DEX MEASUREMENT EXTREMITY Upper Extremity    POSITION  Standing    DOMINANT SIDE Right    At Risk Side Right    BASELINE SCORE (UNILATERAL) -0.7             QUICK DASH SURVEY:  Katina Dung - 03/24/22 0001     Open a tight or new jar Moderate difficulty    Do heavy household chores (wash walls, wash floors) No difficulty    Carry a  shopping bag or briefcase No difficulty    Wash your back No difficulty    Use a knife to cut food No difficulty    Recreational activities in which you take some force or impact through your arm, shoulder, or hand (golf,  hammering, tennis) Mild difficulty    During the past week, to what extent has your arm, shoulder or hand problem interfered with your normal social activities with family, friends, neighbors, or groups? Slightly    During the past week, to what extent has your arm, shoulder or hand problem limited your work or other regular daily activities Slightly    Arm, shoulder, or hand pain. Moderate    Tingling (pins and needles) in your arm, shoulder, or hand None    Difficulty Sleeping Mild difficulty    DASH Score 18.18 %               PATIENT EDUCATION:  Education details: Lymphedema risk reduction and post op shoulder/posture HEP Person educated: Patient Education method: Explanation, Demonstration, Handout Education comprehension: Patient verbalized understanding and returned demonstration  HOME EXERCISE PROGRAM: Patient was instructed today in a home exercise program today for post op shoulder range of motion. These included active assist shoulder flexion in sitting, scapular retraction, wall walking with shoulder abduction, and hands behind head external rotation.  She was encouraged to do these twice a day, holding 3 seconds and repeating 5 times when permitted by her physician.   ASSESSMENT:  CLINICAL IMPRESSION: Patient was diagnosed on 02/22/2022 with right grade 2 invasive ductal carcinoma breast cancer. It measures 1.2 cm and is located in the upper inner quadrant. It is ER/PR positive and HER2 negative with a Ki67 of 20%. Her multidisciplinary medical team met prior to her assessments to determine a recommended treatment plan. She is planning to have a right lumpectomy and sentinel node biopsy followed by Oncotype testing, radiation, and anti-estrogen therapy. She will benefit from a post op PT reassessment to determine needs and from L-Dex screens every 3 months for 2 years to detect subclinical lymphedema.  Pt will benefit from skilled therapeutic intervention to improve on the  following deficits: Decreased knowledge of precautions, impaired UE functional use, pain, decreased ROM, postural dysfunction.   PT treatment/interventions: ADL/self-care home management, pt/family education, therapeutic exercise  REHAB POTENTIAL: Excellent  CLINICAL DECISION MAKING: Stable/uncomplicated  EVALUATION COMPLEXITY: Low   GOALS: Goals reviewed with patient? YES  LONG TERM GOALS: (STG=LTG)    Name Target Date Goal status  1 Pt will be able to verbalize understanding of pertinent lymphedema risk reduction practices relevant to her dx specifically related to skin care.  Baseline:  No knowledge 03/24/2022 Achieved at eval  2 Pt will be able to return demo and/or verbalize understanding of the post op HEP related to regaining shoulder ROM. Baseline:  No knowledge 03/24/2022 Achieved at eval  3 Pt will be able to verbalize understanding of the importance of attending the post op After Breast CA Class for further lymphedema risk reduction education and therapeutic exercise.  Baseline:  No knowledge 03/24/2022 Achieved at eval  4 Pt will demo she has regained full shoulder ROM and function post operatively compared to baselines.  Baseline: See objective measurements taken today. 05/19/2022     PLAN:  PT FREQUENCY/DURATION: EVAL and 1 follow up appointment.   PLAN FOR NEXT SESSION: will reassess 3-4 weeks post op to determine needs.   Patient will follow up at outpatient cancer rehab 3-4 weeks following surgery.  If the patient requires physical therapy at that time, a specific plan will be dictated and sent to the referring physician for approval. The patient was educated today on appropriate basic range of motion exercises to begin post operatively and the importance of attending the After Breast Cancer class following surgery.  Patient was educated today on lymphedema risk reduction practices as it pertains to recommendations that will benefit the patient immediately following  surgery.  She verbalized good understanding.    Physical Therapy Information for After Breast Cancer Surgery/Treatment:  Lymphedema is a swelling condition that you may be at risk for in your arm if you have lymph nodes removed from the armpit area.  After a sentinel node biopsy, the risk is approximately 5-9% and is higher after an axillary node dissection.  There is treatment available for this condition and it is not life-threatening.  Contact your physician or physical therapist with concerns. You may begin the 4 shoulder/posture exercises (see additional sheet) when permitted by your physician (typically a week after surgery).  If you have drains, you may need to wait until those are removed before beginning range of motion exercises.  A general recommendation is to not lift your arms above shoulder height until drains are removed.  These exercises should be done to your tolerance and gently.  This is not a "no pain/no gain" type of recovery so listen to your body and stretch into the range of motion that you can tolerate, stopping if you have pain.  If you are having immediate reconstruction, ask your plastic surgeon about doing exercises as he or she may want you to wait. We encourage you to attend the free one time ABC (After Breast Cancer) class offered by [facility].  You will learn information related to lymphedema risk, prevention and treatment and additional exercises to regain mobility following surgery.  You can call [phone] for more information.  This is offered the 1st and 3rd Monday of each month.  You only attend the class one time. While undergoing any medical procedure or treatment, try to avoid blood pressure being taken or needle sticks from occurring on the arm on the side of cancer.   This recommendation begins after surgery and continues for the rest of your life.  This may help reduce your risk of getting lymphedema (swelling in your arm). An excellent  resource for those seeking information on lymphedema is the National Lymphedema Network's web site. It can be accessed at [address] [email or web address].org If you notice swelling in your hand, arm or breast at any time following surgery (even if it is many years from now), please contact your doctor or physical therapist to discuss this.  Lymphedema can be treated at any time but it is easier for you if it is treated early on.  If you feel like your shoulder motion is not returning to normal in a reasonable amount of time, please contact your surgeon or physical therapist.  [facility] [phone]. [address], [place] [address]  ABC CLASS After Breast Cancer Class  After Breast Cancer Class is a specially designed exercise class to assist you in a safe recover after having breast cancer surgery.  In this class you will learn how to get back to full function whether your drains were just removed or if you had surgery a month ago.  This one-time class is held the 1st and 3rd Monday of every month from 11:00 a.m. until  12:00 noon virtually.  This class is FREE and space is limited. For more information or to register for the next available class, call [phone].  Class Goals  Understand specific stretches to improve the flexibility of you chest and shoulder. Learn ways to safely strengthen your upper body and improve your posture. Understand the warning signs of infection and why you may be at risk for an arm infection. Learn about Lymphedema and prevention.  ** You do not attend this class until after surgery.  Drains must be removed to participate  Patient was instructed today in a home exercise program today for post op shoulder range of motion. These included active assist shoulder flexion in sitting, scapular retraction, wall walking with shoulder abduction, and hands behind head external rotation.  She was encouraged to do these twice a day, holding 3  seconds and repeating 5 times when permitted by her physician.  Annia Friendly, [place] 03/24/22 10:57 AM

## 2022-03-25 ENCOUNTER — Other Ambulatory Visit: Payer: Self-pay | Admitting: General Surgery

## 2022-03-25 DIAGNOSIS — Z170 Estrogen receptor positive status [ER+]: Secondary | ICD-10-CM

## 2022-03-30 ENCOUNTER — Encounter: Payer: Self-pay | Admitting: *Deleted

## 2022-03-30 ENCOUNTER — Telehealth: Payer: Self-pay | Admitting: *Deleted

## 2022-03-30 NOTE — Telephone Encounter (Signed)
Left message for a return phone call to follow up from [facility] 11/15.

## 2022-04-06 ENCOUNTER — Ambulatory Visit: Payer: Self-pay | Admitting: Genetic Counselor

## 2022-04-06 ENCOUNTER — Telehealth: Payer: Self-pay | Admitting: Genetic Counselor

## 2022-04-06 ENCOUNTER — Encounter: Payer: Self-pay | Admitting: Genetic Counselor

## 2022-04-06 DIAGNOSIS — Z800 Family history of malignant neoplasm of digestive organs: Secondary | ICD-10-CM

## 2022-04-06 DIAGNOSIS — Z1379 Encounter for other screening for genetic and chromosomal anomalies: Secondary | ICD-10-CM | POA: Insufficient documentation

## 2022-04-06 DIAGNOSIS — Z170 Estrogen receptor positive status [ER+]: Secondary | ICD-10-CM

## 2022-04-06 DIAGNOSIS — C50411 Malignant neoplasm of upper-outer quadrant of right female breast: Secondary | ICD-10-CM

## 2022-04-06 DIAGNOSIS — Z8042 Family history of malignant neoplasm of prostate: Secondary | ICD-10-CM

## 2022-04-06 DIAGNOSIS — Z803 Family history of malignant neoplasm of breast: Secondary | ICD-10-CM

## 2022-04-06 NOTE — Progress Notes (Signed)
HPI:   Jo Garcia was previously seen in the [place] clinic due to a personal history of breast cancer and concerns regarding a hereditary predisposition to cancer. Please refer to our prior cancer genetics clinic note for more information regarding our discussion, assessment and recommendations, at the time. Jo Garcia's recent genetic test results were disclosed to her, as were recommendations warranted by these results. These results and recommendations are discussed in more detail below.  CANCER HISTORY:  Oncology History  Malignant neoplasm of upper-outer quadrant of right breast in female, estrogen receptor positive ([place])  03/12/2022 Initial Diagnosis   Screening mammogram detected right breast asymmetry at 12 o'clock position by ultrasound measured 1.2 cm, axilla negative, biopsy revealed grade 2 IDC ER 95% PR 90% HER2 equivalent FISH negative ratio 1.17, Ki-67 20%   03/24/2022 Cancer Staging   Staging form: Breast, AJCC 8th Edition - Clinical stage from 03/24/2022: Stage IA (cT1, cN0, cM0, G2, ER+, PR+, HER2-) - Signed by Nicholas Lose, MD on 03/24/2022 Stage prefix: Initial diagnosis Histologic grading system: 3 grade system Laterality: Right Staged by: Pathologist and managing physician Stage used in treatment planning: Yes National guidelines used in treatment planning: Yes Type of national guideline used in treatment planning: NCCN   04/03/2022 Genetic Testing   Negative genetics--[facility] CustomNext-Cancer +RNAinsight Panel.  Report date is 04/03/2022.   The CustomNext-Cancer+RNAinsight panel offered by [facility] includes sequencing and rearrangement analysis for the following 47 genes:  APC, ATM, AXIN2, BARD1, BMPR1A, BRCA1, BRCA2, BRIP1, CDH1, CDK4, CDKN2A, CHEK2, DICER1, EPCAM, GREM1, HOXB13, MEN1, MLH1, MSH2, MSH3, MSH6, MUTYH, NBN, NF1, NF2, NTHL1, PALB2, PMS2, POLD1, POLE, PTEN, RAD51C, RAD51D, RECQL, RET, SDHA, SDHAF2, SDHB, SDHC, SDHD, SMAD4,  SMARCA4, STK11, TP53, TSC1, TSC2, and VHL.  RNA data is routinely analyzed for use in variant interpretation for all genes.     FAMILY HISTORY:  We obtained a detailed, 4-generation family history.  Significant diagnoses are listed below:      Family History  Problem Relation Age of Onset   Breast cancer Maternal Aunt          dx 64s   Prostate cancer Maternal Uncle          mets; d. 32s   Colon cancer Cousin          maternal cousins x4; one dx before age 61; others dx after age 28       Jo Garcia is unaware of previous family history of genetic testing for hereditary cancer risks.There is no reported Ashkenazi Jewish ancestry. There is no known consanguinity.  GENETIC TEST RESULTS:  The [facility] CustomNext-Cancer +RNAinsight Panel found no pathogenic mutations. The CustomNext-Cancer+RNAinsight panel offered by [facility] includes sequencing and rearrangement analysis for the following 47 genes:  APC, ATM, AXIN2, BARD1, BMPR1A, BRCA1, BRCA2, BRIP1, CDH1, CDK4, CDKN2A, CHEK2, DICER1, EPCAM, GREM1, HOXB13, MEN1, MLH1, MSH2, MSH3, MSH6, MUTYH, NBN, NF1, NF2, NTHL1, PALB2, PMS2, POLD1, POLE, PTEN, RAD51C, RAD51D, RECQL, RET, SDHA, SDHAF2, SDHB, SDHC, SDHD, SMAD4, SMARCA4, STK11, TP53, TSC1, TSC2, and VHL.  RNA data is routinely analyzed for use in variant interpretation for all genes.  The test report has been scanned into EPIC and is located under the Molecular Pathology section of the Results Review tab.  A portion of the result report is included below for reference. Genetic testing reported out on April 03, 2022.       Even though a pathogenic variant was not identified, possible explanations for the cancer in the family  may include: There may be no hereditary risk for cancer in the family. The cancers in Jo Garcia and/or her family may be sporadic/familial or due to other genetic and environmental factors. There may be a gene mutation in one of these genes that current  testing methods cannot detect but that chance is small. There could be another gene that has not yet been discovered, or that we have not yet tested, that is responsible for the cancer diagnoses in the family.  It is also possible there is a hereditary cause for the cancer in the family that Jo Garcia did not inherit.   Therefore, it is important to remain in touch with cancer genetics in the future so that we can continue to offer Jo Garcia the most up to date genetic testing.     ADDITIONAL GENETIC TESTING:  We discussed with Jo Garcia that her genetic testing was fairly extensive.  If there are additional relevant genes identified to increase cancer risk that can be analyzed in the future, we would be happy to discuss and coordinate this testing at that time.     CANCER SCREENING RECOMMENDATIONS:  Jo Garcia's test result is considered negative (normal).  This means that we have not identified a hereditary cause for her personal history of breast cancer at this time.   An individual's cancer risk and medical management are not determined by genetic test results alone. Overall cancer risk assessment incorporates additional factors, including personal medical history, family history, and any available genetic information that may result in a personalized plan for cancer prevention and surveillance. Therefore, it is recommended she continue to follow the cancer management and screening guidelines provided by her oncology and primary healthcare provider.  RECOMMENDATIONS FOR FAMILY MEMBERS:   Since she did not inherit a identifiable mutation in a cancer predisposition gene included on this panel, her children could not have inherited a known mutation from her in one of these genes. Individuals in this family might be at some increased risk of developing cancer, over the general population risk, due to the family history of cancer.  Individuals in the family should notify their  providers of the family history of cancer. We recommend women in this family have a yearly mammogram beginning at age 83, or 58 years younger than the earliest onset of cancer, an annual clinical breast exam, and perform monthly breast self-exams.   First degree relatives of those with colon cancer should receive colonoscopies beginning at age 91, or 10 years prior to the earliest diagnosis of colon cancer in the family, and receive colonoscopies at least every 5 years, or as recommended by their gastroenterologist.   Other members of the family may still carry a pathogenic variant in one of these genes that Jo Garcia did not inherit. Based on the family history, we recommend her maternal cousins with colon cancer, or, if deceased, those more closely relative to the them, have appropriate genetic counseling/testing.    FOLLOW-UP:  Lastly, we discussed with Jo Garcia that cancer genetics is a rapidly advancing field and it is possible that new genetic tests will be appropriate for her and/or her family members in the future. We encouraged her to remain in contact with cancer genetics on an annual basis so we can update her personal and family histories and let her know of advances in cancer genetics that may benefit this family.   Our contact number was provided. Jo Garcia's questions were answered to her satisfaction, and she knows  she is welcome to call us at anytime with additional questions or concerns.   Merrell Borsuk M. Joette Catching, [place], [facility] Genetic Counselor [email or web address] (P) [phone]

## 2022-04-06 NOTE — Telephone Encounter (Signed)
Revealed negative genetics.   

## 2022-04-09 HISTORY — PX: BREAST LUMPECTOMY: SHX2

## 2022-04-12 ENCOUNTER — Other Ambulatory Visit: Payer: Self-pay

## 2022-04-12 ENCOUNTER — Encounter (HOSPITAL_BASED_OUTPATIENT_CLINIC_OR_DEPARTMENT_OTHER): Payer: Self-pay | Admitting: General Surgery

## 2022-04-13 ENCOUNTER — Encounter (HOSPITAL_BASED_OUTPATIENT_CLINIC_OR_DEPARTMENT_OTHER)
Admission: RE | Admit: 2022-04-13 | Discharge: 2022-04-13 | Disposition: A | Discharge status: Home / Self Care | Payer: Medicare PPO | Source: Ambulatory Visit | Attending: General Surgery | Admitting: General Surgery

## 2022-04-13 DIAGNOSIS — Z01818 Encounter for other preprocedural examination: Secondary | ICD-10-CM | POA: Insufficient documentation

## 2022-04-13 LAB — BASIC METABOLIC PANEL
Anion gap: 8 (ref 5–15)
BUN: 20 mg/dL (ref 8–23)
CO2: 29 mmol/L (ref 22–32)
Calcium: 9.2 mg/dL (ref 8.9–10.3)
Chloride: 102 mmol/L (ref 98–111)
Creatinine, Ser: 1.21 mg/dL — ABNORMAL HIGH (ref 0.44–1.00)
GFR, Estimated: 49 mL/min — ABNORMAL LOW (ref >60)
Glucose, Bld: 92 mg/dL (ref 70–99)
Potassium: 3.7 mmol/L (ref 3.5–5.1)
Sodium: 139 mmol/L (ref 135–145)

## 2022-04-13 NOTE — Progress Notes (Signed)
Surgical soap given with instructions, pt verbalized understanding.  

## 2022-04-14 DIAGNOSIS — H5213 Myopia, bilateral: Secondary | ICD-10-CM | POA: Diagnosis not present

## 2022-04-14 DIAGNOSIS — H2513 Age-related nuclear cataract, bilateral: Secondary | ICD-10-CM | POA: Diagnosis not present

## 2022-04-16 ENCOUNTER — Other Ambulatory Visit: Payer: Self-pay | Admitting: General Surgery

## 2022-04-16 ENCOUNTER — Ambulatory Visit
Admission: RE | Admit: 2022-04-16 | Discharge: 2022-04-16 | Disposition: A | Discharge status: Home / Self Care | Payer: Medicare PPO | Source: Ambulatory Visit | Attending: General Surgery | Admitting: General Surgery

## 2022-04-16 DIAGNOSIS — Z170 Estrogen receptor positive status [ER+]: Secondary | ICD-10-CM

## 2022-04-16 DIAGNOSIS — N6315 Unspecified lump in the right breast, overlapping quadrants: Secondary | ICD-10-CM | POA: Diagnosis not present

## 2022-04-16 HISTORY — PX: BREAST BIOPSY: SHX20

## 2022-04-16 NOTE — Progress Notes (Signed)
EKG reviewed by Dr Glennon Mac, ok to proceed as scheduled

## 2022-04-19 ENCOUNTER — Ambulatory Visit (HOSPITAL_BASED_OUTPATIENT_CLINIC_OR_DEPARTMENT_OTHER): Payer: Medicare PPO | Admitting: Certified Registered"

## 2022-04-19 ENCOUNTER — Ambulatory Visit
Admission: RE | Admit: 2022-04-19 | Discharge: 2022-04-19 | Disposition: A | Discharge status: Home / Self Care | Payer: Medicare PPO | Source: Ambulatory Visit | Attending: General Surgery | Admitting: General Surgery

## 2022-04-19 ENCOUNTER — Ambulatory Visit (HOSPITAL_BASED_OUTPATIENT_CLINIC_OR_DEPARTMENT_OTHER)
Admission: RE | Admit: 2022-04-19 | Discharge: 2022-04-19 | Disposition: A | Discharge status: Home / Self Care | Payer: Medicare PPO | Attending: General Surgery | Admitting: General Surgery

## 2022-04-19 ENCOUNTER — Encounter (HOSPITAL_BASED_OUTPATIENT_CLINIC_OR_DEPARTMENT_OTHER): Payer: Self-pay | Admitting: General Surgery

## 2022-04-19 ENCOUNTER — Encounter (HOSPITAL_BASED_OUTPATIENT_CLINIC_OR_DEPARTMENT_OTHER)
Admission: RE | Disposition: A | Discharge status: Home / Self Care | Payer: Self-pay | Source: Home / Self Care | Attending: General Surgery

## 2022-04-19 ENCOUNTER — Other Ambulatory Visit: Payer: Self-pay

## 2022-04-19 ENCOUNTER — Ambulatory Visit (HOSPITAL_COMMUNITY)
Admission: RE | Admit: 2022-04-19 | Discharge: 2022-04-19 | Disposition: A | Discharge status: Home / Self Care | Payer: Medicare PPO | Source: Ambulatory Visit | Attending: General Surgery | Admitting: General Surgery

## 2022-04-19 DIAGNOSIS — M109 Gout, unspecified: Secondary | ICD-10-CM | POA: Diagnosis not present

## 2022-04-19 DIAGNOSIS — R928 Other abnormal and inconclusive findings on diagnostic imaging of breast: Secondary | ICD-10-CM | POA: Diagnosis not present

## 2022-04-19 DIAGNOSIS — Z170 Estrogen receptor positive status [ER+]: Secondary | ICD-10-CM

## 2022-04-19 DIAGNOSIS — E785 Hyperlipidemia, unspecified: Secondary | ICD-10-CM | POA: Diagnosis not present

## 2022-04-19 DIAGNOSIS — I10 Essential (primary) hypertension: Secondary | ICD-10-CM | POA: Insufficient documentation

## 2022-04-19 DIAGNOSIS — C50911 Malignant neoplasm of unspecified site of right female breast: Secondary | ICD-10-CM

## 2022-04-19 DIAGNOSIS — K219 Gastro-esophageal reflux disease without esophagitis: Secondary | ICD-10-CM | POA: Diagnosis not present

## 2022-04-19 DIAGNOSIS — C50211 Malignant neoplasm of upper-inner quadrant of right female breast: Secondary | ICD-10-CM | POA: Insufficient documentation

## 2022-04-19 DIAGNOSIS — K449 Diaphragmatic hernia without obstruction or gangrene: Secondary | ICD-10-CM | POA: Insufficient documentation

## 2022-04-19 DIAGNOSIS — Z01818 Encounter for other preprocedural examination: Secondary | ICD-10-CM

## 2022-04-19 DIAGNOSIS — C50411 Malignant neoplasm of upper-outer quadrant of right female breast: Secondary | ICD-10-CM

## 2022-04-19 DIAGNOSIS — Z803 Family history of malignant neoplasm of breast: Secondary | ICD-10-CM | POA: Diagnosis not present

## 2022-04-19 HISTORY — PX: BREAST LUMPECTOMY WITH RADIOACTIVE SEED AND SENTINEL LYMPH NODE BIOPSY: SHX6550

## 2022-04-19 SURGERY — BREAST LUMPECTOMY WITH RADIOACTIVE SEED AND SENTINEL LYMPH NODE BIOPSY
Anesthesia: General | Site: Breast | Laterality: Right

## 2022-04-19 MED ORDER — ONDANSETRON HCL 4 MG/2ML IJ SOLN
INTRAMUSCULAR | Status: DC | PRN
  Administered 2022-04-19: 4 mg via INTRAVENOUS

## 2022-04-19 MED ORDER — PROPOFOL 10 MG/ML IV BOLUS
INTRAVENOUS | Status: AC
  Filled 2022-04-19: qty 20

## 2022-04-19 MED ORDER — PROPOFOL 10 MG/ML IV BOLUS
INTRAVENOUS | Status: DC | PRN
  Administered 2022-04-19: 200 mg via INTRAVENOUS

## 2022-04-19 MED ORDER — APREPITANT 40 MG PO CAPS
ORAL_CAPSULE | ORAL | Status: AC
  Filled 2022-04-19: qty 1

## 2022-04-19 MED ORDER — BUPIVACAINE LIPOSOME 1.3 % IJ SUSP
INTRAMUSCULAR | Status: DC | PRN
  Administered 2022-04-19: 10 mL via PERINEURAL

## 2022-04-19 MED ORDER — FENTANYL CITRATE (PF) 100 MCG/2ML IJ SOLN
INTRAMUSCULAR | Status: AC
  Filled 2022-04-19: qty 2

## 2022-04-19 MED ORDER — GLYCOPYRROLATE 0.2 MG/ML IJ SOLN
INTRAMUSCULAR | Status: DC | PRN
  Administered 2022-04-19: .2 mg via INTRAVENOUS

## 2022-04-19 MED ORDER — DEXAMETHASONE SODIUM PHOSPHATE 10 MG/ML IJ SOLN
INTRAMUSCULAR | Status: AC
  Filled 2022-04-19: qty 1

## 2022-04-19 MED ORDER — OXYCODONE HCL 5 MG PO TABS: 5.0000 mg | ORAL_TABLET | Freq: Once | ORAL | Status: DC | PRN

## 2022-04-19 MED ORDER — ACETAMINOPHEN 500 MG PO TABS
1000.0000 mg | ORAL_TABLET | ORAL | Status: AC
  Administered 2022-04-19: 1000 mg via ORAL

## 2022-04-19 MED ORDER — AMISULPRIDE (ANTIEMETIC) 5 MG/2ML IV SOLN: 10.0000 mg | Freq: Once | INTRAVENOUS | Status: DC | PRN

## 2022-04-19 MED ORDER — MIDAZOLAM HCL 2 MG/2ML IJ SOLN
INTRAMUSCULAR | Status: AC
  Filled 2022-04-19: qty 2

## 2022-04-19 MED ORDER — FENTANYL CITRATE (PF) 100 MCG/2ML IJ SOLN: 25.0000 ug | INTRAMUSCULAR | Status: DC | PRN

## 2022-04-19 MED ORDER — ACETAMINOPHEN 500 MG PO TABS
ORAL_TABLET | ORAL | Status: AC
  Filled 2022-04-19: qty 2

## 2022-04-19 MED ORDER — GABAPENTIN 300 MG PO CAPS
300.0000 mg | ORAL_CAPSULE | ORAL | Status: AC
  Administered 2022-04-19: 300 mg via ORAL

## 2022-04-19 MED ORDER — BUPIVACAINE-EPINEPHRINE 0.25% -1:200000 IJ SOLN
INTRAMUSCULAR | Status: DC | PRN
  Administered 2022-04-19: 21 mL

## 2022-04-19 MED ORDER — ONDANSETRON HCL 4 MG/2ML IJ SOLN
INTRAMUSCULAR | Status: AC
  Filled 2022-04-19: qty 2

## 2022-04-19 MED ORDER — PHENYLEPHRINE HCL (PRESSORS) 10 MG/ML IV SOLN
INTRAVENOUS | Status: DC | PRN
  Administered 2022-04-19: 240 ug via INTRAVENOUS

## 2022-04-19 MED ORDER — APREPITANT 40 MG PO CAPS
40.0000 mg | ORAL_CAPSULE | Freq: Once | ORAL | Status: AC
  Administered 2022-04-19: 40 mg via ORAL

## 2022-04-19 MED ORDER — LIDOCAINE HCL (CARDIAC) PF 100 MG/5ML IV SOSY
PREFILLED_SYRINGE | INTRAVENOUS | Status: DC | PRN
  Administered 2022-04-19: 60 mg via INTRAVENOUS

## 2022-04-19 MED ORDER — EPHEDRINE SULFATE (PRESSORS) 50 MG/ML IJ SOLN
INTRAMUSCULAR | Status: DC | PRN
  Administered 2022-04-19: 10 mg via INTRAVENOUS
  Administered 2022-04-19 (×3): 15 mg via INTRAVENOUS
  Administered 2022-04-19: 10 mg via INTRAVENOUS

## 2022-04-19 MED ORDER — LACTATED RINGERS IV SOLN: INTRAVENOUS | Status: DC

## 2022-04-19 MED ORDER — CHLORHEXIDINE GLUCONATE CLOTH 2 % EX PADS: 6.0000 | MEDICATED_PAD | Freq: Once | CUTANEOUS | Status: DC

## 2022-04-19 MED ORDER — FENTANYL CITRATE (PF) 100 MCG/2ML IJ SOLN
100.0000 ug | Freq: Once | INTRAMUSCULAR | Status: AC
  Administered 2022-04-19: 50 ug via INTRAVENOUS

## 2022-04-19 MED ORDER — TECHNETIUM TC 99M TILMANOCEPT KIT
1.0000 | PACK | Freq: Once | INTRAVENOUS | Status: AC | PRN
  Administered 2022-04-19: 1 via INTRADERMAL

## 2022-04-19 MED ORDER — LIDOCAINE 2% (20 MG/ML) 5 ML SYRINGE
INTRAMUSCULAR | Status: AC
  Filled 2022-04-19: qty 5

## 2022-04-19 MED ORDER — MIDAZOLAM HCL 2 MG/2ML IJ SOLN
2.0000 mg | Freq: Once | INTRAMUSCULAR | Status: AC
  Administered 2022-04-19: 1 mg via INTRAVENOUS

## 2022-04-19 MED ORDER — VANCOMYCIN HCL IN DEXTROSE 1-5 GM/200ML-% IV SOLN
1000.0000 mg | INTRAVENOUS | Status: AC
  Administered 2022-04-19: 1000 mg via INTRAVENOUS

## 2022-04-19 MED ORDER — GABAPENTIN 300 MG PO CAPS
ORAL_CAPSULE | ORAL | Status: AC
  Filled 2022-04-19: qty 1

## 2022-04-19 MED ORDER — ONDANSETRON HCL 4 MG/2ML IJ SOLN: 4.0000 mg | Freq: Once | INTRAMUSCULAR | Status: DC | PRN

## 2022-04-19 MED ORDER — LACTATED RINGERS IV SOLN: INTRAVENOUS | Status: DC | PRN

## 2022-04-19 MED ORDER — FENTANYL CITRATE (PF) 100 MCG/2ML IJ SOLN
INTRAMUSCULAR | Status: DC | PRN
  Administered 2022-04-19: 50 ug via INTRAVENOUS

## 2022-04-19 MED ORDER — VANCOMYCIN HCL 1000 MG IV SOLR
INTRAVENOUS | Status: DC | PRN
  Administered 2022-04-19: 1000 mg via INTRAVENOUS

## 2022-04-19 MED ORDER — TRAMADOL HCL 50 MG PO TABS: 50.0000 mg | ORAL_TABLET | Freq: Four times a day (QID) | ORAL | 0 refills | Status: DC | PRN

## 2022-04-19 MED ORDER — BUPIVACAINE HCL (PF) 0.5 % IJ SOLN
INTRAMUSCULAR | Status: DC | PRN
  Administered 2022-04-19: 20 mL via PERINEURAL

## 2022-04-19 MED ORDER — PROPOFOL 500 MG/50ML IV EMUL
INTRAVENOUS | Status: DC | PRN
  Administered 2022-04-19: 150 ug/kg/min via INTRAVENOUS

## 2022-04-19 MED ORDER — DEXAMETHASONE SODIUM PHOSPHATE 10 MG/ML IJ SOLN
INTRAMUSCULAR | Status: DC | PRN
  Administered 2022-04-19: 10 mg via INTRAVENOUS

## 2022-04-19 MED ORDER — OXYCODONE HCL 5 MG/5ML PO SOLN: 5.0000 mg | Freq: Once | ORAL | Status: DC | PRN

## 2022-04-19 MED ORDER — VANCOMYCIN HCL IN DEXTROSE 1-5 GM/200ML-% IV SOLN
INTRAVENOUS | Status: AC
  Filled 2022-04-19: qty 200

## 2022-04-19 SURGICAL SUPPLY — 43 items
ADH SKN CLS APL DERMABOND .7 (GAUZE/BANDAGES/DRESSINGS) ×1
APL PRP STRL LF DISP 70% ISPRP (MISCELLANEOUS) ×1
APPLIER CLIP 9.375 MED OPEN (MISCELLANEOUS) ×1
APR CLP MED 9.3 20 MLT OPN (MISCELLANEOUS) ×1
BLADE SURG 15 STRL LF DISP TIS (BLADE) ×1 IMPLANT
BLADE SURG 15 STRL SS (BLADE) ×1
CANISTER SUC SOCK COL 7IN (MISCELLANEOUS) IMPLANT
CANISTER SUCT 1200ML W/VALVE (MISCELLANEOUS) IMPLANT
CHLORAPREP W/TINT 26 (MISCELLANEOUS) ×1 IMPLANT
CLIP APPLIE 9.375 MED OPEN (MISCELLANEOUS) ×1 IMPLANT
COVER BACK TABLE 60X90IN (DRAPES) ×1 IMPLANT
COVER MAYO STAND STRL (DRAPES) ×1 IMPLANT
COVER PROBE CYLINDRICAL 5X96 (MISCELLANEOUS) ×1 IMPLANT
DERMABOND ADVANCED .7 DNX12 (GAUZE/BANDAGES/DRESSINGS) ×1 IMPLANT
DRAPE LAPAROSCOPIC ABDOMINAL (DRAPES) ×1 IMPLANT
DRAPE UTILITY XL STRL (DRAPES) ×1 IMPLANT
ELECT COATED BLADE 2.86 ST (ELECTRODE) ×1 IMPLANT
ELECT REM PT RETURN 9FT ADLT (ELECTROSURGICAL) ×1
ELECTRODE REM PT RTRN 9FT ADLT (ELECTROSURGICAL) ×1 IMPLANT
GLOVE BIO SURGEON STRL SZ7.5 (GLOVE) ×1 IMPLANT
GOWN STRL REUS W/ TWL LRG LVL3 (GOWN DISPOSABLE) ×2 IMPLANT
GOWN STRL REUS W/TWL LRG LVL3 (GOWN DISPOSABLE) ×2
ILLUMINATOR WAVEGUIDE N/F (MISCELLANEOUS) IMPLANT
KIT MARKER MARGIN INK (KITS) ×1 IMPLANT
LIGHT WAVEGUIDE WIDE FLAT (MISCELLANEOUS) IMPLANT
NDL HYPO 25X1 1.5 SAFETY (NEEDLE) ×1 IMPLANT
NDL SAFETY ECLIP 18X1.5 (MISCELLANEOUS) IMPLANT
NEEDLE HYPO 25X1 1.5 SAFETY (NEEDLE) ×1 IMPLANT
NS IRRIG 1000ML POUR BTL (IV SOLUTION) IMPLANT
PACK BASIN DAY SURGERY FS (CUSTOM PROCEDURE TRAY) ×1 IMPLANT
PENCIL SMOKE EVACUATOR (MISCELLANEOUS) ×1 IMPLANT
SLEEVE SCD COMPRESS KNEE MED (STOCKING) ×1 IMPLANT
SPIKE FLUID TRANSFER (MISCELLANEOUS) IMPLANT
SPONGE T-LAP 18X18 ~~LOC~~+RFID (SPONGE) ×1 IMPLANT
SUT MON AB 4-0 PC3 18 (SUTURE) ×2 IMPLANT
SUT SILK 2 0 SH (SUTURE) IMPLANT
SUT VICRYL 3-0 CR8 SH (SUTURE) ×1 IMPLANT
SYR CONTROL 10ML LL (SYRINGE) ×1 IMPLANT
TOWEL GREEN STERILE FF (TOWEL DISPOSABLE) ×1 IMPLANT
TRACER MAGTRACE VIAL (MISCELLANEOUS) IMPLANT
TRAY FAXITRON CT DISP (TRAY / TRAY PROCEDURE) ×1 IMPLANT
TUBE CONNECTING 20X1/4 (TUBING) IMPLANT
YANKAUER SUCT BULB TIP NO VENT (SUCTIONS) IMPLANT

## 2022-04-19 NOTE — Anesthesia Procedure Notes (Signed)
Anesthesia Regional Block: Pectoralis block   Pre-Anesthetic Checklist: , timeout performed,  Correct Patient, Correct Site, Correct Laterality,  Correct Procedure, Correct Position, site marked,  Risks and benefits discussed,  Surgical consent,  Pre-op evaluation,  At surgeon's request and post-op pain management  Laterality: Right  Prep: chloraprep       Needles:  Injection technique: Single-shot  Needle Type: Echogenic Stimulator Needle     Needle Length: 10cm  Needle Gauge: 21   Needle insertion depth: 8 cm   Additional Needles:   Procedures:,,,, ultrasound used (permanent image in chart),,    Narrative:  Start time: 04/19/2022 10:19 AM End time: 04/19/2022 10:24 AM Injection made incrementally with aspirations every 5 mL.  Performed by: Personally  Anesthesiologist: Josephine Igo, MD  Additional Notes: Timeout performed. Patient sedated. Relevant anatomy ID'd using [place]. Incremental 2-34m injection of LA with frequent aspiration. Patient tolerated procedure well.

## 2022-04-19 NOTE — Transfer of Care (Signed)
Immediate Anesthesia Transfer of Care Note  Patient: Jo Garcia  Procedure(s) Performed: RIGHT BREAST LUMPECTOMY WITH RADIOACTIVE SEED AND SENTINEL LYMPH NODE BIOPSY (Right: Breast)  Patient Location: PACU  Anesthesia Type:General and Regional  Level of Consciousness: drowsy and patient cooperative  Airway & Oxygen Therapy: Patient Spontanous Breathing and Patient connected to face mask oxygen  Post-op Assessment: Report given to RN and Post -op Vital signs reviewed and stable  Post vital signs: Reviewed and stable  Last Vitals:  Vitals Value Taken Time  BP 116/61 04/19/22 1328  Temp    Pulse 72 04/19/22 1329  Resp 9 04/19/22 1329  SpO2 99 % 04/19/22 1329  Vitals shown include unvalidated device data.  Last Pain:  Vitals:   04/19/22 0950  PainSc: 0-No pain      Patients Stated Pain Goal: 3 (45/91/36 8599)  Complications: No notable events documented.

## 2022-04-19 NOTE — Discharge Instructions (Addendum)
  Post Anesthesia Home Care Instructions  Activity: Get plenty of rest for the remainder of the day. A responsible individual must stay with you for 24 hours following the procedure.  For the next 24 hours, DO NOT: -Drive a car -Paediatric nurse -Drink alcoholic beverages -Take any medication unless instructed by your physician -Make any legal decisions or sign important papers.  Meals: Start with liquid foods such as gelatin or soup. Progress to regular foods as tolerated. Avoid greasy, spicy, heavy foods. If nausea and/or vomiting occur, drink only clear liquids until the nausea and/or vomiting subsides. Call your physician if vomiting continues.  Special Instructions/Symptoms: Your throat may feel dry or sore from the anesthesia or the breathing tube placed in your throat during surgery. If this causes discomfort, gargle with warm salt water. The discomfort should disappear within 24 hours.  If you had a scopolamine patch placed behind your ear for the management of post- operative nausea and/or vomiting:  1. The medication in the patch is effective for 72 hours, after which it should be removed.  Wrap patch in a tissue and discard in the trash. Wash hands thoroughly with soap and water. 2. You may remove the patch earlier than 72 hours if you experience unpleasant side effects which may include dry mouth, dizziness or visual disturbances. 3. Avoid touching the patch. Wash your hands with soap and water after contact with the patch.     May have Tylenol today after 6:00 PM  Regional Anesthesia Blocks  1. Numbness or the inability to move the "blocked" extremity may last from 3-48 hours after placement. The length of time depends on the medication injected and your individual response to the medication. If the numbness is not going away after 48 hours, call your surgeon.  2. The extremity that is blocked will need to be protected until the numbness is gone and the  Strength has  returned. Because you cannot feel it, you will need to take extra care to avoid injury. Because it may be weak, you may have difficulty moving it or using it. You may not know what position it is in without looking at it while the block is in effect.  3. For blocks in the legs and feet, returning to weight bearing and walking needs to be done carefully. You will need to wait until the numbness is entirely gone and the strength has returned. You should be able to move your leg and foot normally before you try and bear weight or walk. You will need someone to be with you when you first try to ensure you do not fall and possibly risk injury.  4. Bruising and tenderness at the needle site are common side effects and will resolve in a few days.  5. Persistent numbness or new problems with movement should be communicated to the surgeon or the [facility] [phone] [employer] [phone]).

## 2022-04-19 NOTE — H&P (Signed)
REFERRING PHYSICIAN: Rulon Eisenmenger, MD  PROVIDER: Landry Corporal, MD  MRN: [id number] DOB: [birth year] Subjective  Chief Complaint: Breast Cancer   History of Present Illness: Jo Garcia is a 66 y.o. female who is seen today as an office consultation for evaluation of Breast Cancer .  We are asked to see the patient in consultation by Dr. Lindi Adie to evaluate her for a new right breast cancer. The patient is a 66 year old female who recently went for a routine screening mammogram. At that time she was found to have a asymmetry in the upper inner quadrant of the right breast that measured 1.2 cm. The axilla looked normal. The asymmetry was biopsied and came back as a grade 2 invasive ductal cancer that was ER and PR positive and HER2 negative with a Ki-67 of 20%. She is otherwise in pretty good health and does not smoke. She does have a family history of breast cancer in a maternal aunt.  Review of Systems: A complete review of systems was obtained from the patient. I have reviewed this information and discussed as appropriate with the patient. See HPI as well for other ROS.  Review of Systems Constitutional: Negative. HENT: Negative. Eyes: Negative. Respiratory: Negative. Cardiovascular: Negative. Gastrointestinal: Negative. Genitourinary: Negative. Musculoskeletal: Negative. Skin: Negative. Neurological: Negative. Endo/Heme/Allergies: Negative. Psychiatric/Behavioral: Negative.   Medical History: Past Medical History: Diagnosis Date Anemia Arthritis Glaucoma (increased eye pressure) Hypertension Thyroid disease  Patient Active Problem List Diagnosis Malignant neoplasm of upper-inner quadrant of right breast in female, estrogen receptor positive  History reviewed. No pertinent surgical history.  Allergies Allergen Reactions Penicillins Hives and Rash Codeine Nausea And Vomiting Sulfamethoxazole-Trimethoprim Hives and Rash  Current Outpatient Medications  on File Prior to Visit Medication Sig Dispense Refill allopurinoL (ZYLOPRIM) 300 MG tablet Take 300 mg by mouth once daily atorvastatin (LIPITOR) 10 MG tablet Take 10 mg by mouth once daily benazepriL (LOTENSIN) 20 MG tablet Take by mouth levothyroxine (SYNTHROID) 75 MCG tablet Take 75 mcg by mouth once daily  No current facility-administered medications on file prior to visit.  Family History Problem Relation Age of Onset Coronary Artery Disease (Blocked arteries around heart) Father   Social History  Tobacco Use Smoking Status Never Smokeless Tobacco Never   Social History  Socioeconomic History Marital status: Married Tobacco Use Smoking status: Never Smokeless tobacco: Never Substance and Sexual Activity Alcohol use: Not Currently Drug use: Not Currently  Objective: There were no vitals filed for this visit. There is no height or weight on file to calculate BMI.  Physical Exam Vitals reviewed. Constitutional: General: She is not in acute distress. Appearance: Normal appearance. HENT: Head: Normocephalic and atraumatic. Right Ear: External ear normal. Left Ear: External ear normal. Nose: Nose normal. Mouth/Throat: Mouth: Mucous membranes are moist. Pharynx: Oropharynx is clear. Eyes: General: No scleral icterus. Extraocular Movements: Extraocular movements intact. Conjunctiva/sclera: Conjunctivae normal. Pupils: Pupils are equal, round, and reactive to light. Cardiovascular: Rate and Rhythm: Normal rate and regular rhythm. Pulses: Normal pulses. Heart sounds: Normal heart sounds. Pulmonary: Effort: Pulmonary effort is normal. No respiratory distress. Breath sounds: Normal breath sounds. Abdominal: General: Bowel sounds are normal. Palpations: Abdomen is soft. Tenderness: There is no abdominal tenderness. Musculoskeletal: General: No swelling, tenderness or deformity. Normal range of motion. Cervical back: Normal range of motion and neck  supple. Skin: General: Skin is warm and dry. Coloration: Skin is not jaundiced. Neurological: General: No focal deficit present. Mental Status: She is alert and oriented to person, place, and time. Psychiatric: Mood  and Affect: Mood normal. Behavior: Behavior normal.    Breast: There is no palpable mass in either breast. There is no palpable axillary, supraclavicular, or cervical lymphadenopathy.  Labs, Imaging and Diagnostic Testing:  Assessment and Plan:  Diagnoses and all orders for this visit:  Malignant neoplasm of upper-inner quadrant of right breast in female, estrogen receptor positive - [facility] Case Posting Request; Future    The patient appears to have a 1.2 cm cancer in the upper inner quadrant of the right breast with clinically negative nodes and all favorable markers. I have discussed with her in detail the different options for treatment and at this point she favors breast conservation which I feel is very reasonable. She is a good candidate for sentinel node mapping as well. I have discussed with her in detail the risks and benefits of the operation as well as some of the technical aspects including the use of a radioactive seed for localization and she understands and wishes to proceed. She will meet with medical and radiation oncology to discuss adjuvant therapy. She will also meet with physical therapy for preoperative lymphedema testing. We will proceed with surgical planning

## 2022-04-19 NOTE — Interval H&P Note (Signed)
History and Physical Interval Note:  04/19/2022 11:42 AM  Jo Garcia  has presented today for surgery, with the diagnosis of RIGHT BREAST CANCER.  The various methods of treatment have been discussed with the patient and family. After consideration of risks, benefits and other options for treatment, the patient has consented to  Procedure(s): RIGHT BREAST LUMPECTOMY WITH RADIOACTIVE SEED AND SENTINEL LYMPH NODE BIOPSY (Right) as a surgical intervention.  The patient's history has been reviewed, patient examined, no change in status, stable for surgery.  I have reviewed the patient's chart and labs.  Questions were answered to the patient's satisfaction.     [name]

## 2022-04-19 NOTE — Anesthesia Postprocedure Evaluation (Signed)
Anesthesia Post Note  Patient: Jo Garcia  Procedure(s) Performed: RIGHT BREAST LUMPECTOMY WITH RADIOACTIVE SEED AND SENTINEL LYMPH NODE BIOPSY (Right: Breast)     Patient location during evaluation: PACU Anesthesia Type: General Level of consciousness: awake and alert and oriented Pain management: pain level controlled Vital Signs Assessment: post-procedure vital signs reviewed and stable Respiratory status: spontaneous breathing, nonlabored ventilation and respiratory function stable Cardiovascular status: blood pressure returned to baseline and stable Postop Assessment: no apparent nausea or vomiting Anesthetic complications: no   No notable events documented.  Last Vitals:  Vitals:   04/19/22 1400 04/19/22 1415  BP: 121/76 123/74  Pulse: 86 74  Resp: (!) 21 14  Temp:    SpO2: 100% 96%    Last Pain:  Vitals:   04/19/22 1400  PainSc: 0-No pain                 Mylinda Brook A.

## 2022-04-19 NOTE — Progress Notes (Signed)
Nuc med injections completed. Patient tolerated well.   

## 2022-04-19 NOTE — Anesthesia Preprocedure Evaluation (Addendum)
Anesthesia Evaluation  Patient identified by MRN, date of birth, ID band Patient awake    Reviewed: Allergy & Precautions, NPO status , Patient's Chart, lab work & pertinent test results, reviewed documented beta blocker date and time   History of Anesthesia Complications (+) PONV and history of anesthetic complications  Airway Mallampati: II  TM Distance: >3 FB Neck ROM: Full    Dental no notable dental hx. (+) Teeth Intact, Dental Advisory Given   Pulmonary neg pulmonary ROS   Pulmonary exam normal breath sounds clear to auscultation       Cardiovascular hypertension, Pt. on medications and Pt. on home beta blockers Normal cardiovascular exam Rhythm:Regular Rate:Normal     Neuro/Psych  negative psych ROS   GI/Hepatic Neg liver ROS, hiatal hernia,GERD  Medicated,,  Endo/Other  Gout Hyperlipidemia Right Breast Ca  Renal/GU Renal InsufficiencyRenal diseaseHx/o renal calculi  negative genitourinary   Musculoskeletal  (+) Arthritis , Osteoarthritis,    Abdominal   Peds  Hematology  (+) Blood dyscrasia, anemia   Anesthesia Other Findings   Reproductive/Obstetrics                             Anesthesia Physical Anesthesia Plan  ASA: 2  Anesthesia Plan: General   Post-op Pain Management: Minimal or no pain anticipated and Regional block*   Induction:   PONV Risk Score and Plan: 4 or greater and Treatment may vary due to age or medical condition, Ondansetron and Dexamethasone  Airway Management Planned: LMA and Simple Face Mask  Additional Equipment: None  Intra-op Plan:   Post-operative Plan: Extubation in OR  Informed Consent: I have reviewed the patients History and Physical, chart, labs and discussed the procedure including the risks, benefits and alternatives for the proposed anesthesia with the patient or authorized representative who has indicated his/her understanding and  acceptance.     Dental advisory given  Plan Discussed with: CRNA and Anesthesiologist  Anesthesia Plan Comments:        Anesthesia Quick Evaluation

## 2022-04-19 NOTE — Progress Notes (Signed)
Assisted Dr. Foster with right, pectoralis, ultrasound guided block. Side rails up, monitors on throughout procedure. See vital signs in flow sheet. Tolerated Procedure well. 

## 2022-04-19 NOTE — Op Note (Addendum)
04/19/2022  1:20 PM  PATIENT:  Jo Garcia  66 y.o. female  PRE-OPERATIVE DIAGNOSIS:  RIGHT BREAST CANCER  POST-OPERATIVE DIAGNOSIS:  RIGHT BREAST CANCER  PROCEDURE:  Procedure(s): RIGHT BREAST LUMPECTOMY WITH RADIOACTIVE SEED LOCALIZATION AND DEEP RIGHT AXILLARY SENTINEL LYMPH NODE BIOPSY (Right)  SURGEON:  Surgeon(s) and Role:    * Jovita Kussmaul, MD - Primary  PHYSICIAN ASSISTANT:   ASSISTANTS: Dr. Mariel Aloe   ANESTHESIA:   local and general  EBL:  minimal   BLOOD ADMINISTERED:none  DRAINS: none   LOCAL MEDICATIONS USED:  MARCAINE     SPECIMEN:  Source of Specimen:  right breast tissue with additional deep margin and sentinel nodes x 2  DISPOSITION OF SPECIMEN:  PATHOLOGY  COUNTS:  YES  TOURNIQUET:  * No tourniquets in log *  DICTATION: .Dragon Dictation  After informed consent was obtained the patient was brought to the operating room and placed in the supine position on the operating table.  After adequate induction of general anesthesia the patient's right chest, breast, and axillary area were prepped with ChloraPrep, allowed to dry, and draped in usual sterile manner.  An appropriate timeout was performed.  Previously an I-125 seed was placed in the upper portion of the right breast to mark an area of invasive breast cancer.  Also earlier in the day the patient underwent injection of 1 mCi of technetium sulfur colloid in the subareolar right breast.  The neoprobe was set to technetium and area of radioactivity was readily identified in the right axilla.  This area was infiltrated with quarter percent Marcaine.  A small transversely oriented incision was made overlying the area of radioactivity.  The incision was then carried through the skin and subcutaneous tissue sharply with the electrocautery until the deep right axillary space was entered.  Blunt hemostat dissection was directed by the neoprobe.  I was able to identify 2 hot lymph nodes.  Each of these was  excised sharply with the electrocautery and the surrounding small vessels and lymphatics were controlled with clips.  Ex vivo counts on these nodes were approximately 600 and 2000.  No other hot or palpable nodes were identified in the right axilla.  Hemostasis was achieved using the Bovie electrocautery.  The deep layer of the incision was then closed with interrupted 3-0 Vicryl stitches.  The skin was then closed with a running 4-0 Monocryl subcuticular stitch.  Attention was then turned to the right breast.  The neoprobe was set to I-125 in the area of radioactivity was readily identified.  The area around this was infiltrated with quarter percent Marcaine.  A curvilinear incision was made along the upper edge of the areola of the right breast with a 15 blade knife.  The incision was carried through the skin and subcutaneous tissue sharply with the electrocautery.  Dissection was carried along the upper portion of the breast between the breast tissue in the subcutaneous fat and skin.  Once this dissection was beyond the area of the cancer I then removed a circular portion of breast tissue sharply with the electrocautery around the radioactive seed while checking the area of radioactivity frequently.  Once the specimen was removed it was oriented with the appropriate paint colors.  A specimen radiograph was obtained that showed the clip and seed to be near the center of the specimen.  I did elect to take an additional deep margin and this was marked appropriately.  All of the tissue was then sent to pathology for  further evaluation.  Hemostasis was achieved using the Bovie electrocautery.  The wound was irrigated with saline and infiltrated with more quarter percent Marcaine.  The cavity was marked with clips.  The deep layer of the incision was then closed with layers of interrupted 3-0 Vicryl stitches.  The skin was then closed with interrupted 4-0 Monocryl subcuticular stitches.  Dermabond dressings were  applied.  The patient tolerated the procedure well.  At the end of the case all needle sponge and instrument counts were correct.  The patient was then awakened and taken to recovery in stable condition.I was personally present during the key and critical portions of this procedure and immediately available throughout the entire procedure, as documented in my operative note.   PLAN OF CARE: Discharge to home after PACU  PATIENT DISPOSITION:  PACU - hemodynamically stable.   Delay start of Pharmacological VTE agent (>24hrs) due to surgical blood loss or risk of bleeding: not applicable

## 2022-04-20 ENCOUNTER — Encounter (HOSPITAL_BASED_OUTPATIENT_CLINIC_OR_DEPARTMENT_OTHER): Payer: Self-pay | Admitting: General Surgery

## 2022-04-21 DIAGNOSIS — C50911 Malignant neoplasm of unspecified site of right female breast: Secondary | ICD-10-CM | POA: Diagnosis not present

## 2022-04-21 LAB — SURGICAL PATHOLOGY

## 2022-04-26 ENCOUNTER — Encounter: Payer: Self-pay | Admitting: *Deleted

## 2022-04-26 ENCOUNTER — Telehealth: Payer: Self-pay | Admitting: *Deleted

## 2022-04-26 NOTE — Telephone Encounter (Signed)
Received order for oncotype testing. Requisition sent to pathology 

## 2022-04-28 ENCOUNTER — Telehealth: Payer: Self-pay

## 2022-04-28 NOTE — Telephone Encounter (Signed)
Received call from [employer] requesting demo/insurance. Faxed to [phone]. Fax confirmation received.

## 2022-05-06 NOTE — Progress Notes (Signed)
Patient Care Team: [name], [name], MD as PCP - General (Internal Medicine) Mauro Kaufmann, RN as Oncology Nurse Navigator Rockwell Germany, RN as Oncology Nurse Navigator Nicholas Lose, MD as Consulting Physician (Hematology and Oncology) Gery Pray, MD as Consulting Physician (Radiation Oncology) Jovita Kussmaul, MD as Consulting Physician (General Surgery)  DIAGNOSIS:  Encounter Diagnosis  Name Primary?   Malignant neoplasm of upper-outer quadrant of right breast in female, estrogen receptor positive ([facility]) Yes    SUMMARY OF ONCOLOGIC HISTORY: Oncology History  Malignant neoplasm of upper-outer quadrant of right breast in female, estrogen receptor positive ([facility])  03/12/2022 Initial Diagnosis   Screening mammogram detected right breast asymmetry at 12 o'clock position by ultrasound measured 1.2 cm, axilla negative, biopsy revealed grade 2 IDC ER 95% PR 90% HER2 equivalent FISH negative ratio 1.17, Ki-67 20%   03/24/2022 Cancer Staging   Staging form: Breast, AJCC 8th Edition - Clinical stage from 03/24/2022: Stage IA (cT1, cN0, cM0, G2, ER+, PR+, HER2-) - Signed by Nicholas Lose, MD on 03/24/2022 Stage prefix: Initial diagnosis Histologic grading system: 3 grade system Laterality: Right Staged by: Pathologist and managing physician Stage used in treatment planning: Yes National guidelines used in treatment planning: Yes Type of national guideline used in treatment planning: NCCN   04/03/2022 Genetic Testing   Negative genetics--[facility] CustomNext-Cancer +RNAinsight Panel.  Report date is 04/03/2022.   The CustomNext-Cancer+RNAinsight panel offered by [facility] includes sequencing and rearrangement analysis for the following 47 genes:  APC, ATM, AXIN2, BARD1, BMPR1A, BRCA1, BRCA2, BRIP1, CDH1, CDK4, CDKN2A, CHEK2, DICER1, EPCAM, GREM1, HOXB13, MEN1, MLH1, MSH2, MSH3, MSH6, MUTYH, NBN, NF1, NF2, NTHL1, PALB2, PMS2, POLD1, POLE, PTEN, RAD51C, RAD51D, RECQL, RET, SDHA,  SDHAF2, SDHB, SDHC, SDHD, SMAD4, SMARCA4, STK11, TP53, TSC1, TSC2, and VHL.  RNA data is routinely analyzed for use in variant interpretation for all genes.   04/19/2022 Surgery   Right lumpectomy: Grade 2 IDC 1.5 cm, margins negative, 0/4 lymph nodes negative, ER 95%, PR 90%, Ki-67 20%, HER2 negative     CHIEF COMPLIANT: Follow-up after surgery  INTERVAL HISTORY: Jo Garcia is a 66 y.o. female is here because of recent diagnosis of right breast cancer. She presents to the clinic for a follow-up after surgery.  She is healing and recovering very well from the recent surgery.   ALLERGIES:  is allergic to penicillins, sulfa antibiotics, bactrim [sulfamethoxazole-trimethoprim], and codeine.  MEDICATIONS:  Current Outpatient Medications  Medication Sig Dispense Refill   acetaminophen (TYLENOL) 500 MG tablet Take 1,500 mg by mouth as needed. Alternates with Ibuprofen     allopurinol (ZYLOPRIM) 300 MG tablet Take 300 mg by mouth daily.     atorvastatin (LIPITOR) 10 MG tablet Take 10 mg by mouth daily.     benazepril (LOTENSIN) 20 MG tablet Take 20 mg by mouth daily. Alternating days with Benazepril-HCTZ     benazepril-hydrochlorthiazide (LOTENSIN HCT) 20-12.5 MG tablet Take by mouth.     diclofenac sodium (VOLTAREN) 1 % GEL Apply 4 g topically 4 (four) times daily as needed (arthritis).     Ibuprofen 200 MG CAPS Take 400 mg by mouth as needed. Alternates with Tylenol     levothyroxine (SYNTHROID, LEVOTHROID) 75 MCG tablet Take 75 mcg by mouth daily before breakfast.     metoprolol succinate (TOPROL-XL) 50 MG 24 hr tablet Take 50 mg by mouth daily. Take with or immediately following a meal.     naproxen (EC NAPROSYN) 500 MG EC tablet Take 500 mg by  mouth 2 (two) times daily with a meal.     traMADol (ULTRAM) 50 MG tablet Take 1 tablet (50 mg total) by mouth every 6 (six) hours as needed. 20 tablet 0   No current facility-administered medications for this visit.    PHYSICAL  EXAMINATION: ECOG PERFORMANCE STATUS: 1 - Symptomatic but completely ambulatory  Vitals:   05/11/22 1439  BP: 106/67  Pulse: 60  Temp: 97.8 F (36.6 C)  SpO2: 99%   Filed Weights   05/11/22 1439  Weight: 168 lb 4.8 oz (76.3 kg)      LABORATORY DATA:  I have reviewed the data as listed    Latest Ref Rng & Units 04/13/2022    3:00 PM 03/24/2022    8:16 AM 01/19/2022   11:41 AM  CMP  Glucose 70 - 99 mg/dL 92  104  91   BUN 8 - 23 mg/dL 20  25  18   Creatinine 0.44 - 1.00 mg/dL 1.21  1.22  1.20   Sodium 135 - 145 mmol/L 139  142  143   Potassium 3.5 - 5.1 mmol/L 3.7  4.2  4.6   Chloride 98 - 111 mmol/L 102  109  106   CO2 22 - 32 mmol/L 29  29  31   Calcium 8.9 - 10.3 mg/dL 9.2  9.2  9.8   Total Protein 6.5 - 8.1 g/dL  7.4  7.0   Total Bilirubin 0.3 - 1.2 mg/dL  0.7  0.5   Alkaline Phos 38 - 126 U/L  69    AST 15 - 41 U/L  23  21   ALT 0 - 44 U/L  22  16     Lab Results  Component Value Date   WBC 6.6 03/24/2022   HGB 14.2 03/24/2022   HCT 42.7 03/24/2022   MCV 91.6 03/24/2022   PLT 158 03/24/2022   NEUTROABS 4.2 03/24/2022    ASSESSMENT & PLAN:  Malignant neoplasm of upper-outer quadrant of right breast in female, estrogen receptor positive ([facility]) 04/19/2022:Right lumpectomy: Grade 2 IDC 1.5 cm, margins negative, 0/4 lymph nodes negative, ER 95%, PR 90%, Ki-67 20%, HER2 negative  Pathology counseling: I discussed the final pathology report of the patient provided  a copy of this report. I discussed the margins as well as lymph node surgeries. We also discussed the final staging along with previously performed ER/PR and HER-2/neu testing.  Treatment plan: Oncotype DX testing to determine if chemotherapy would be of any benefit followed by Adjuvant radiation therapy followed by Adjuvant antiestrogen therapy  Day after her surgery her husband had a cerebellar stroke and is recovering from that. Return to clinic based upon Oncotype DX test result    No orders  of the defined types were placed in this encounter.  The patient has a good understanding of the overall plan. she agrees with it. she will call with any problems that may develop before the next visit here. Total time spent: 30 mins including face to face time and time spent for planning, charting and co-ordination of care   Viinay K Gudena, MD 05/11/22    I Deritra, Mcnairy am acting as a scribe for Dr.Vinay Gudena  I have reviewed the above documentation for accuracy and completeness, and I agree with the above.   

## 2022-05-11 ENCOUNTER — Inpatient Hospital Stay: Payer: Medicare PPO | Attending: Hematology and Oncology | Admitting: Hematology and Oncology

## 2022-05-11 VITALS — BP 106/67 | HR 60 | Temp 97.8°F | Wt 168.3 lb

## 2022-05-11 DIAGNOSIS — Z170 Estrogen receptor positive status [ER+]: Secondary | ICD-10-CM | POA: Diagnosis not present

## 2022-05-11 DIAGNOSIS — Z79899 Other long term (current) drug therapy: Secondary | ICD-10-CM | POA: Insufficient documentation

## 2022-05-11 DIAGNOSIS — C50411 Malignant neoplasm of upper-outer quadrant of right female breast: Secondary | ICD-10-CM | POA: Diagnosis not present

## 2022-05-11 NOTE — Assessment & Plan Note (Signed)
04/19/2022:Right lumpectomy: Grade 2 IDC 1.5 cm, margins negative, 0/4 lymph nodes negative, ER 95%, PR 90%, Ki-67 20%, HER2 negative  Pathology counseling: I discussed the final pathology report of the patient provided  a copy of this report. I discussed the margins as well as lymph node surgeries. We also discussed the final staging along with previously performed ER/PR and HER-2/neu testing.  Treatment plan: Oncotype DX testing to determine if chemotherapy would be of any benefit followed by Adjuvant radiation therapy followed by Adjuvant antiestrogen therapy  Return to clinic based upon Oncotype DX test result

## 2022-05-12 NOTE — Therapy (Signed)
OUTPATIENT PHYSICAL THERAPY BREAST CANCER POST OP FOLLOW UP   Patient Name: Jo Garcia MRN: [id number] DOB:01-06-1956, 67 y.o., female Today's Date: 05/13/2022  END OF SESSION:  PT End of Session - 05/13/22 1137     Visit Number 2    Number of Visits 2    Date for PT Re-Evaluation 05/19/22    PT Start Time 1103    PT Stop Time 1130    PT Time Calculation (min) 27 min    Activity Tolerance Patient tolerated treatment well    Behavior During Therapy WFL for tasks assessed/performed             Past Medical History:  Diagnosis Date   Anemia    Chronic kidney disease    uric acid kidney stones   Family history of breast cancer 03/24/2022   Family history of colon cancer 03/24/2022   Family history of prostate cancer 03/24/2022   GERD (gastroesophageal reflux disease)    past hx - uses ranitadine OTC prn only    Gout    Hyperlipidemia    Hypertension    Lumbar spine pain    Neuromuscular disorder ([place])    hiatal hernia   Osteoarthritis    thumbs, big toe left foot, top both feet   PONV (postoperative nausea and vomiting)    Thyroid disease    Past Surgical History:  Procedure Laterality Date   ABLATION     BREAST BIOPSY  04/16/2022   [place] RT RADIOACTIVE SEED LOC 04/16/2022 [facility]AMMOGRAP[facility]   BREAST LUMPECTOMY WITH RADIOACTIVE SEED AND SENTINEL LYMPH NODE BIOPSY Right 04/19/2022   Procedure: RIGHT BREAST LUMPECTOMY WITH RADIOACTIVE SEED AND SENTINEL LYMPH NODE BIOPSY;  Surgeon: Jovita Kussmaul, MD;  Location: [facility];  Service: General;  Laterality: Right;   COLONOSCOPY  2018   KN-MAC-suprep(exc)-tics   DILATION AND CURETTAGE OF UTERUS     no anesthesia during this procedure   EYE SURGERY Bilateral 12/2017   eyelid surgery    SHOULDER SURGERY Right    repair of tear    Patient Active Problem List   Diagnosis Date Noted   Genetic testing 04/06/2022   Family history of breast cancer 03/24/2022   Family history of prostate cancer  03/24/2022   Family history of colon cancer 03/24/2022   Malignant neoplasm of upper-outer quadrant of right breast in female, estrogen receptor positive ([place]) 03/22/2022   Medial epicondylitis of both elbows 01/18/2020   Dyslipidemia 11/04/2016   Osteoarthritis of foot 04/30/2016   History of hypertension 04/30/2016   History of renal calculi 04/30/2016   Vitamin D deficiency 04/30/2016   History of hypothyroidism 04/30/2016   Primary osteoarthritis of both hands 04/30/2016   Primary osteoarthritis of both knees 04/30/2016   Idiopathic chronic gout of multiple sites without tophus 04/30/2016    PCP: Dr. Domenick Gong  REFERRING PROVIDER: Dr .Autumn Messing  REFERRING DIAG: Rt breast cancer  THERAPY DIAG:  Malignant neoplasm of upper-inner quadrant of right breast in female, estrogen receptor positive ([place])  Abnormal posture  Aftercare following surgery for neoplasm  Rationale for Evaluation and Treatment: Rehabilitation  ONSET DATE: 02/22/22  SUBJECTIVE:  SUBJECTIVE STATEMENT: I feel almost 100% except for getting tired  PERTINENT HISTORY:  Patient was diagnosed on 02/22/2022 with right grade 2 invasive ductal carcinoma breast cancer. It measures 1.2 cm and is located in the upper inner quadrant. It is ER/PR positive and HER2 negative with a Ki67 of 20%. She had a Rt lumpectomy and SLNB on 04/19/22 with 4 negative nodes removed.  Will be having radiation.   PATIENT GOALS:  Reassess how my recovery is going related to arm function, pain, and swelling.  PAIN:  Are you having pain? No  PRECAUTIONS: Recent Surgery, right UE Lymphedema risk,   ACTIVITY LEVEL / LEISURE: feels back to normal   OBJECTIVE:   PATIENT SURVEYS:  QUICK DASH: 18% from 18%  OBSERVATIONS: Not wearing the  compression bra anymore, healing well  POSTURE:  WNL  LYMPHEDEMA ASSESSMENT:  UPPER EXTREMITY AROM/PROM:   A/PROM RIGHT   eval   05/13/22  Shoulder extension 58 50  Shoulder flexion 151 160  Shoulder abduction 159 175  Shoulder internal rotation 75   Shoulder external rotation 80 80                          (Blank rows = not tested)   A/PROM LEFT   eval  Shoulder extension 52  Shoulder flexion 154  Shoulder abduction 170  Shoulder internal rotation 68  Shoulder external rotation 88                          (Blank rows = not tested)   CERVICAL AROM: All within normal limits     UPPER EXTREMITY STRENGTH: WNL   LYMPHEDEMA ASSESSMENTS:    LANDMARK RIGHT   eval 05/13/22  10 cm proximal to olecranon process 29.7 30  Olecranon process 25._0 cm proximal to ulnar styloid process 21.4 21.5  Just proximal to ulnar styloid process 15.4 15.5  Across hand at thumb web space 18.3 18.3  At base of 2nd digit 6 6  (Blank rows = not tested)   LANDMARK LEFT   eval  10 cm proximal to olecranon process 29.3  Olecranon process 24.3  10 cm proximal to ulnar styloid process 21.2  Just proximal to ulnar styloid process 14.8  Across hand at thumb web space 17.6  At base of 2nd digit 5.9  (Blank rows = not tested)  PATIENT EDUCATION:  Education details: post op care sheet per below Person educated: Patient and Spouse Education method: Explanation, Media planner, Verbal cues, and Handouts Education comprehension: verbalized understanding  HOME EXERCISE PROGRAM: Reviewed previously given post op HEP.   ASSESSMENT:  CLINICAL IMPRESSION: Pt is doing very well post lumpectomy with return to full ROM, no signs of cording, well healed incisions, no more need for PT visits except for SOZO.   Pt will benefit from skilled therapeutic intervention to improve on the following deficits: Decreased knowledge of precautions, impaired UE functional use, pain, decreased ROM, postural  dysfunction.   PT treatment/interventions: ADL/Self care home management, Patient/Family education and Re-evaluation   GOALS: Goals reviewed with patient? Yes  LONG TERM GOALS:  (STG=LTG)  GOALS Name Target Date  Goal status  1 Pt will demonstrate she has regained full shoulder ROM and function post operatively compared to baselines.  Baseline: 05/13/22 MET                    PLAN:  PT FREQUENCY/DURATION: IRWE  only  PLAN FOR NEXT SESSION: SOZO only   [facility]  [address], [address]  [place] [address]  [phone]  After Breast Cancer Class It is recommended you attend the ABC class to be educated on lymphedema risk reduction. This class is free of charge and lasts for 1 hour. It is a 1-time class. You will need to download the Webex app either on your phone or computer. We will send you a link the night before or the morning of the class. You should be able to click on that link to join the class. This is not a confidential class. You don't have to turn your camera on, but other participants may be able to see your email address.  Scar massage You can begin gentle scar massage to you incision sites. Gently place one hand on the incision and move the skin (without sliding on the skin) in various directions. Do this for a few minutes and then you can gently massage either coconut oil or vitamin E cream into the scars.  Compression garment You should continue wearing your compression bra until you feel like you no longer have swelling.  Home exercise Program Continue doing the exercises you were given until you feel like you can do them without feeling any tightness at the end.   Walking Program Studies show that 30 minutes of walking per day (fast enough to elevate your heart rate) can significantly reduce the risk of a cancer recurrence. If you can't walk due to other medical reasons, we encourage you to find another activity you could do (like  a stationary bike or water exercise).  Posture After breast cancer surgery, people frequently sit with rounded shoulders posture because it puts their incisions on slack and feels better. If you sit like this and scar tissue forms in that position, you can become very tight and have pain sitting or standing with good posture. Try to be aware of your posture and sit and stand up tall to heal properly.  Follow up PT: It is recommended you return every 3 months for the first 3 years following surgery to be assessed on the SOZO machine for an L-Dex score. This helps prevent clinically significant lymphedema in 95% of patients. These follow up screens are 10 minute appointments that you are not billed for.  Stark Bray, PT 05/13/2022, 11:38 AM  PHYSICAL THERAPY DISCHARGE SUMMARY  Visits from Start of Care: 2  Current functional level related to goals / functional outcomes: See above    Remaining deficits: Lymphedema risk   Education / Equipment: Per above  Plan: Patient agrees to discharge. Patient is being discharged due to meeting the stated rehab goals.

## 2022-05-13 ENCOUNTER — Encounter: Payer: Self-pay | Admitting: Rehabilitation

## 2022-05-13 ENCOUNTER — Encounter: Payer: Self-pay | Admitting: Hematology and Oncology

## 2022-05-13 ENCOUNTER — Ambulatory Visit: Payer: Medicare PPO | Attending: General Surgery | Admitting: Rehabilitation

## 2022-05-13 DIAGNOSIS — Z170 Estrogen receptor positive status [ER+]: Secondary | ICD-10-CM | POA: Diagnosis not present

## 2022-05-13 DIAGNOSIS — C50411 Malignant neoplasm of upper-outer quadrant of right female breast: Secondary | ICD-10-CM | POA: Diagnosis not present

## 2022-05-13 DIAGNOSIS — C50211 Malignant neoplasm of upper-inner quadrant of right female breast: Secondary | ICD-10-CM | POA: Insufficient documentation

## 2022-05-13 DIAGNOSIS — Z483 Aftercare following surgery for neoplasm: Secondary | ICD-10-CM | POA: Diagnosis not present

## 2022-05-13 DIAGNOSIS — R293 Abnormal posture: Secondary | ICD-10-CM | POA: Insufficient documentation

## 2022-05-14 ENCOUNTER — Encounter: Payer: Self-pay | Admitting: *Deleted

## 2022-05-14 ENCOUNTER — Telehealth: Payer: Self-pay | Admitting: *Deleted

## 2022-05-14 ENCOUNTER — Encounter (HOSPITAL_COMMUNITY): Payer: Self-pay

## 2022-05-14 DIAGNOSIS — Z170 Estrogen receptor positive status [ER+]: Secondary | ICD-10-CM

## 2022-05-14 NOTE — Telephone Encounter (Signed)
Received oncotype results 8/3%. Patient is aware. Referral placed for Dr. Sondra Come.

## 2022-05-16 NOTE — Progress Notes (Signed)
Radiation Oncology         [phone] ________________________________  Name: Jo Garcia MRN: [id number]  Date: 05/17/2022  DOB: 1955/09/22  Re-Evaluation Note  CC: [name], [name], MD  Nicholas Lose, MD  No diagnosis found.  Diagnosis:  Stage IA (cT1, cN0, cM0) Right Breast UOQ, Invasive Ductal Carcinoma, ER+ / PR+ / Her2-, Grade 2 : s/p lumpectomy and SLN biopsies with clean margins and negative nodes   Narrative:  The patient returns today to discuss radiation treatment options. She was seen in the multidisciplinary breast clinic on 03/24/22.   She underwent genetic testing on her consultation date. Results showed no clinically significant variants detected by BRCAplus or +RNAinsight testing.  She opted to proceed with a right breast lumpectomy with SLN biopsies on 04/19/22 under the care of Dr. Marlou Starks. Pathology from the procedure revealed: grade 2 invasive ductal carcinoma measuring 1.5 cm in the greatest extent. All margins negative for invasive disease. Nodal status of 4/4 right axillary sentinel lymph node excisions negative for carcinoma. Prognostic indicators significant for: estrogen receptor 95% positive and progesterone receptor 90% positive, both with strong staining intensity; Proliferation marker Ki67 at 20%; Her2 status negative; Grade 2.   Oncotype DX was obtained on the final surgical sample and the recurrence score of 8 predicts a risk of recurrence outside the breast over the next 9 years of 3%, if the patient's only systemic therapy were to be an antiestrogen for 5 years.  It also predicted no significant benefit from chemotherapy.  Based on her Oncotype results, the patient will return to Dr. Lindi Adie in the near future to discuss antiestrogen treatment options.   On review of systems, the patient reports ***. She denies *** and any other symptoms.    Allergies:  is allergic to penicillins, sulfa antibiotics, bactrim [sulfamethoxazole-trimethoprim], and  codeine.  Meds: Current Outpatient Medications  Medication Sig Dispense Refill   acetaminophen (TYLENOL) 500 MG tablet Take 1,500 mg by mouth as needed. Alternates with Ibuprofen     allopurinol (ZYLOPRIM) 300 MG tablet Take 300 mg by mouth daily.     atorvastatin (LIPITOR) 10 MG tablet Take 10 mg by mouth daily.     benazepril (LOTENSIN) 20 MG tablet Take 20 mg by mouth daily. Alternating days with Benazepril-HCTZ     benazepril-hydrochlorthiazide (LOTENSIN HCT) 20-12.5 MG tablet Take by mouth.     diclofenac sodium (VOLTAREN) 1 % GEL Apply 4 g topically 4 (four) times daily as needed (arthritis).     Ibuprofen 200 MG CAPS Take 400 mg by mouth as needed. Alternates with Tylenol     levothyroxine (SYNTHROID, LEVOTHROID) 75 MCG tablet Take 75 mcg by mouth daily before breakfast.     metoprolol succinate (TOPROL-XL) 50 MG 24 hr tablet Take 50 mg by mouth daily. Take with or immediately following a meal.     naproxen (EC NAPROSYN) 500 MG EC tablet Take 500 mg by mouth 2 (two) times daily with a meal.     No current facility-administered medications for this encounter.    Physical Findings: The patient is in no acute distress. Patient is alert and oriented.  vitals were not taken for this visit.  No significant changes. Lungs are clear to auscultation bilaterally. Heart has regular rate and rhythm. No palpable cervical, supraclavicular, or axillary adenopathy. Abdomen soft, non-tender, normal bowel sounds. Left Breast: no palpable mass, nipple discharge or bleeding. Right Breast: ***  Lab Findings: Lab Results  Component Value Date   WBC 6.6  03/24/2022   HGB 14.2 03/24/2022   HCT 42.7 03/24/2022   MCV 91.6 03/24/2022   PLT 158 03/24/2022    Radiographic Findings: MM Breast Surgical Specimen  Result Date: 04/19/2022 CLINICAL DATA:  Status post seed localized RIGHT lumpectomy. EXAM: SPECIMEN RADIOGRAPH OF THE RIGHT BREAST COMPARISON:  Previous exam(s). FINDINGS: Status post excision  of the right breast. The radioactive seed and ribbon shaped biopsy marker clip are present, completely intact. The findings are discussed with the operating room nurse at the time of interpretation. IMPRESSION: Specimen radiograph of the right breast. Electronically Signed   By: Nolon Nations M.D.   On: 04/19/2022 13:11  NM Sentinel Node Inj-No Rpt (Breast)  Result Date: 04/19/2022 Sulfur Colloid was injected by the Nuclear Medicine Technologist for sentinel lymph node localization.   MM CLIP PLACEMENT RIGHT  Result Date: 04/16/2022 CLINICAL DATA:  Mammogram status post radioactive seed placement. EXAM: DIAGNOSTIC RIGHT MAMMOGRAM POST ULTRASOUND-GUIDED RADIOACTIVE SEED PLACEMENT COMPARISON:  Previous exam(s). FINDINGS: Mammographic images were obtained following ultrasound-guided radioactive seed placement. These demonstrate the radioactive seed is immediately adjacent to the ribbon shaped biopsy marking clip within the mass in the superior right breast. IMPRESSION: Appropriate location of the radioactive seed. Final Assessment: Post Procedure Mammograms for Seed Placement Electronically Signed   By: Ammie Ferrier M.D.   On: 04/16/2022 14:56  [place] RT RADIOACTIVE SEED LOC  Result Date: 04/16/2022 CLINICAL DATA:  Radioactive seed localization of the right breast prior to lumpectomy. EXAM: ULTRASOUND GUIDED RADIOACTIVE SEED LOCALIZATION OF THE RIGHT BREAST COMPARISON:  Previous exam(s). FINDINGS: Patient presents for radioactive seed localization prior to right breast lumpectomy. I met with the patient and we discussed the procedure of seed localization including benefits and alternatives. We discussed the high likelihood of a successful procedure. We discussed the risks of the procedure including infection, bleeding, tissue injury and further surgery. We discussed the low dose of radioactivity involved in the procedure. Informed, written consent was given. The usual time-out protocol was performed  immediately prior to the procedure. Using ultrasound guidance, sterile technique, 1% lidocaine and an I-125 radioactive seed, the mass in the right breast at 12 o'clock, 3 cm from the nipple was localized using a lateral approach. The follow-up mammogram images confirm the seed in the expected location and were marked for Dr. Marlou Starks. Follow-up survey of the patient confirms presence of the radioactive seed. Order number of I-125 seed:  [id number]. Total activity:  9.470 millicuries reference Date: 03/15/2022 The patient tolerated the procedure well and was released from the [facility]. She was given instructions regarding seed removal. IMPRESSION: Radioactive seed localization right breast. No apparent complications. Electronically Signed   By: Ammie Ferrier M.D.   On: 04/16/2022 14:42   Impression: Stage IA (cT1, cN0, cM0) Right Breast UOQ, Invasive Ductal Carcinoma, ER+ / PR+ / Her2-, Grade 2 : s/p lumpectomy and SLN biopsies with clean margins and negative nodes   ***  Plan:  Patient is scheduled for CT simulation {date/later today}. ***  -----------------------------------  Blair Promise, PhD, MD  This document serves as a record of services personally performed by Gery Pray, MD. It was created on his behalf by Roney Mans, a trained medical scribe. The creation of this record is based on the scribe's personal observations and the provider's statements to them. This document has been checked and approved by the attending provider.

## 2022-05-17 ENCOUNTER — Encounter: Payer: Self-pay | Admitting: Radiation Oncology

## 2022-05-17 ENCOUNTER — Ambulatory Visit
Admission: RE | Admit: 2022-05-17 | Discharge: 2022-05-17 | Disposition: A | Discharge status: Home / Self Care | Payer: Medicare PPO | Source: Ambulatory Visit | Attending: Radiation Oncology | Admitting: Radiation Oncology

## 2022-05-17 ENCOUNTER — Other Ambulatory Visit: Payer: Self-pay

## 2022-05-17 ENCOUNTER — Inpatient Hospital Stay: Admission: RE | Admit: 2022-05-17 | Payer: Self-pay | Source: Ambulatory Visit

## 2022-05-17 VITALS — BP 112/66 | HR 51 | Temp 97.8°F | Resp 16 | Ht 64.5 in | Wt 170.2 lb

## 2022-05-17 DIAGNOSIS — Z510 Encounter for antineoplastic radiation therapy: Secondary | ICD-10-CM | POA: Diagnosis not present

## 2022-05-17 DIAGNOSIS — C50411 Malignant neoplasm of upper-outer quadrant of right female breast: Secondary | ICD-10-CM | POA: Diagnosis not present

## 2022-05-17 DIAGNOSIS — Z791 Long term (current) use of non-steroidal anti-inflammatories (NSAID): Secondary | ICD-10-CM | POA: Insufficient documentation

## 2022-05-17 DIAGNOSIS — Z170 Estrogen receptor positive status [ER+]: Secondary | ICD-10-CM | POA: Insufficient documentation

## 2022-05-17 DIAGNOSIS — Z79890 Hormone replacement therapy (postmenopausal): Secondary | ICD-10-CM | POA: Diagnosis not present

## 2022-05-17 DIAGNOSIS — Z79899 Other long term (current) drug therapy: Secondary | ICD-10-CM | POA: Diagnosis not present

## 2022-05-17 NOTE — Progress Notes (Signed)
New Breast Cancer Diagnosis: Right Breast UOQ  Did patient present with symptoms (if so, please note symptoms) or screening mammography?: Screening mammogram detected right breast asymmetry.   Location and Extent of disease :right breast. Located at 12 o'clock position, measured 1.5 cm in greatest dimension. Adenopathy no.  Histology per Pathology Report: grade 2, Invasive Ductal Carcinoma 04/19/2022  Receptor Status: ER(positive), PR (positive), Her2-neu (negative), Ki-(20%)  Surgeon and surgical plan, if any:  Dr. Marlou Starks -Right Breast Lumpectomy with radioactive seed and SLN biopsy 04/19/2022   Medical oncologist, treatment if any:   Dr. Lindi Adie 05/11/2022 -Treatment plan: Oncotype DX testing to determine if chemotherapy would be of any benefit followed by Adjuvant radiation therapy followed by Adjuvant antiestrogen therapy  Oncotype results 8/3%   Family History of Breast/Ovarian/Prostate Cancer: Maternal Aunt had breast cancer.  Lymphedema issues, if any: None     Pain issues, if any: She reports occasional tenderness to her underarm.    SAFETY ISSUES: Prior radiation? No Pacemaker/ICD? No Possible current pregnancy? Ablation, Postmenopausal Is the patient on methotrexate? No  Current Complaints / other details:   Genetics 04/03/2022- Negative

## 2022-05-19 DIAGNOSIS — Z510 Encounter for antineoplastic radiation therapy: Secondary | ICD-10-CM | POA: Diagnosis not present

## 2022-05-19 DIAGNOSIS — Z170 Estrogen receptor positive status [ER+]: Secondary | ICD-10-CM | POA: Diagnosis not present

## 2022-05-19 DIAGNOSIS — C50411 Malignant neoplasm of upper-outer quadrant of right female breast: Secondary | ICD-10-CM | POA: Diagnosis not present

## 2022-05-25 ENCOUNTER — Ambulatory Visit
Admission: RE | Admit: 2022-05-25 | Discharge: 2022-05-25 | Disposition: A | Discharge status: Home / Self Care | Payer: Medicare PPO | Source: Ambulatory Visit | Attending: Radiation Oncology | Admitting: Radiation Oncology

## 2022-05-25 ENCOUNTER — Other Ambulatory Visit: Payer: Self-pay

## 2022-05-25 DIAGNOSIS — Z170 Estrogen receptor positive status [ER+]: Secondary | ICD-10-CM | POA: Diagnosis not present

## 2022-05-25 DIAGNOSIS — Z510 Encounter for antineoplastic radiation therapy: Secondary | ICD-10-CM | POA: Diagnosis not present

## 2022-05-25 DIAGNOSIS — C50411 Malignant neoplasm of upper-outer quadrant of right female breast: Secondary | ICD-10-CM | POA: Diagnosis not present

## 2022-05-25 LAB — RAD ONC ARIA SESSION SUMMARY
Course Elapsed Days: 0
Plan Fractions Treated to Date: 1
Plan Prescribed Dose Per Fraction: 2.67 Gy
Plan Total Fractions Prescribed: 15
Plan Total Prescribed Dose: 40.05 Gy
Reference Point Dosage Given to Date: 2.67 Gy
Reference Point Session Dosage Given: 2.67 Gy
Session Number: 1

## 2022-05-25 MED ORDER — ALRA NON-METALLIC DEODORANT (RAD-ONC)
1.0000 | Freq: Once | TOPICAL | Status: AC
  Administered 2022-05-25: 1 via TOPICAL

## 2022-05-25 MED ORDER — RADIAPLEXRX EX GEL: Freq: Once | CUTANEOUS | Status: AC

## 2022-05-26 ENCOUNTER — Other Ambulatory Visit: Payer: Self-pay

## 2022-05-26 ENCOUNTER — Ambulatory Visit
Admission: RE | Admit: 2022-05-26 | Discharge: 2022-05-26 | Disposition: A | Discharge status: Home / Self Care | Payer: Medicare PPO | Source: Ambulatory Visit | Attending: Radiation Oncology | Admitting: Radiation Oncology

## 2022-05-26 DIAGNOSIS — Z170 Estrogen receptor positive status [ER+]: Secondary | ICD-10-CM | POA: Diagnosis not present

## 2022-05-26 DIAGNOSIS — C50411 Malignant neoplasm of upper-outer quadrant of right female breast: Secondary | ICD-10-CM | POA: Diagnosis not present

## 2022-05-26 DIAGNOSIS — Z510 Encounter for antineoplastic radiation therapy: Secondary | ICD-10-CM | POA: Diagnosis not present

## 2022-05-26 LAB — RAD ONC ARIA SESSION SUMMARY
Course Elapsed Days: 1
Plan Fractions Treated to Date: 2
Plan Prescribed Dose Per Fraction: 2.67 Gy
Plan Total Fractions Prescribed: 15
Plan Total Prescribed Dose: 40.05 Gy
Reference Point Dosage Given to Date: 5.34 Gy
Reference Point Session Dosage Given: 2.67 Gy
Session Number: 2

## 2022-05-27 ENCOUNTER — Encounter (HOSPITAL_COMMUNITY): Payer: Self-pay

## 2022-05-27 ENCOUNTER — Other Ambulatory Visit: Payer: Self-pay

## 2022-05-27 ENCOUNTER — Encounter: Payer: Self-pay | Admitting: *Deleted

## 2022-05-27 ENCOUNTER — Ambulatory Visit
Admission: RE | Admit: 2022-05-27 | Discharge: 2022-05-27 | Disposition: A | Discharge status: Home / Self Care | Payer: Medicare PPO | Source: Ambulatory Visit | Attending: Radiation Oncology | Admitting: Radiation Oncology

## 2022-05-27 DIAGNOSIS — Z510 Encounter for antineoplastic radiation therapy: Secondary | ICD-10-CM | POA: Diagnosis not present

## 2022-05-27 DIAGNOSIS — Z170 Estrogen receptor positive status [ER+]: Secondary | ICD-10-CM | POA: Diagnosis not present

## 2022-05-27 DIAGNOSIS — C50411 Malignant neoplasm of upper-outer quadrant of right female breast: Secondary | ICD-10-CM | POA: Diagnosis not present

## 2022-05-27 LAB — RAD ONC ARIA SESSION SUMMARY
Course Elapsed Days: 2
Plan Fractions Treated to Date: 3
Plan Prescribed Dose Per Fraction: 2.67 Gy
Plan Total Fractions Prescribed: 15
Plan Total Prescribed Dose: 40.05 Gy
Reference Point Dosage Given to Date: 8.01 Gy
Reference Point Session Dosage Given: 2.67 Gy
Session Number: 3

## 2022-05-28 ENCOUNTER — Other Ambulatory Visit: Payer: Self-pay

## 2022-05-28 ENCOUNTER — Ambulatory Visit
Admission: RE | Admit: 2022-05-28 | Discharge: 2022-05-28 | Disposition: A | Discharge status: Home / Self Care | Payer: Medicare PPO | Source: Ambulatory Visit | Attending: Radiation Oncology | Admitting: Radiation Oncology

## 2022-05-28 DIAGNOSIS — Z170 Estrogen receptor positive status [ER+]: Secondary | ICD-10-CM | POA: Diagnosis not present

## 2022-05-28 DIAGNOSIS — Z510 Encounter for antineoplastic radiation therapy: Secondary | ICD-10-CM | POA: Diagnosis not present

## 2022-05-28 DIAGNOSIS — C50411 Malignant neoplasm of upper-outer quadrant of right female breast: Secondary | ICD-10-CM | POA: Diagnosis not present

## 2022-05-28 LAB — RAD ONC ARIA SESSION SUMMARY
Course Elapsed Days: 3
Plan Fractions Treated to Date: 4
Plan Prescribed Dose Per Fraction: 2.67 Gy
Plan Total Fractions Prescribed: 15
Plan Total Prescribed Dose: 40.05 Gy
Reference Point Dosage Given to Date: 10.68 Gy
Reference Point Session Dosage Given: 2.67 Gy
Session Number: 4

## 2022-05-31 ENCOUNTER — Ambulatory Visit
Admission: RE | Admit: 2022-05-31 | Discharge: 2022-05-31 | Disposition: A | Discharge status: Home / Self Care | Payer: Medicare PPO | Source: Ambulatory Visit | Attending: Radiation Oncology | Admitting: Radiation Oncology

## 2022-05-31 ENCOUNTER — Other Ambulatory Visit: Payer: Self-pay

## 2022-05-31 DIAGNOSIS — Z170 Estrogen receptor positive status [ER+]: Secondary | ICD-10-CM | POA: Diagnosis not present

## 2022-05-31 DIAGNOSIS — Z510 Encounter for antineoplastic radiation therapy: Secondary | ICD-10-CM | POA: Diagnosis not present

## 2022-05-31 DIAGNOSIS — C50411 Malignant neoplasm of upper-outer quadrant of right female breast: Secondary | ICD-10-CM

## 2022-05-31 LAB — RAD ONC ARIA SESSION SUMMARY
Course Elapsed Days: 6
Plan Fractions Treated to Date: 5
Plan Prescribed Dose Per Fraction: 2.67 Gy
Plan Total Fractions Prescribed: 15
Plan Total Prescribed Dose: 40.05 Gy
Reference Point Dosage Given to Date: 13.35 Gy
Reference Point Session Dosage Given: 2.67 Gy
Session Number: 5

## 2022-05-31 MED ORDER — RADIAPLEXRX EX GEL: Freq: Once | CUTANEOUS | Status: AC

## 2022-06-01 ENCOUNTER — Other Ambulatory Visit: Payer: Self-pay

## 2022-06-01 ENCOUNTER — Ambulatory Visit
Admission: RE | Admit: 2022-06-01 | Discharge: 2022-06-01 | Disposition: A | Discharge status: Home / Self Care | Payer: Medicare PPO | Source: Ambulatory Visit | Attending: Radiation Oncology | Admitting: Radiation Oncology

## 2022-06-01 DIAGNOSIS — C50411 Malignant neoplasm of upper-outer quadrant of right female breast: Secondary | ICD-10-CM

## 2022-06-01 DIAGNOSIS — Z170 Estrogen receptor positive status [ER+]: Secondary | ICD-10-CM | POA: Diagnosis not present

## 2022-06-01 DIAGNOSIS — Z510 Encounter for antineoplastic radiation therapy: Secondary | ICD-10-CM | POA: Diagnosis not present

## 2022-06-01 LAB — RAD ONC ARIA SESSION SUMMARY
Course Elapsed Days: 7
Plan Fractions Treated to Date: 6
Plan Prescribed Dose Per Fraction: 2.67 Gy
Plan Total Fractions Prescribed: 15
Plan Total Prescribed Dose: 40.05 Gy
Reference Point Dosage Given to Date: 16.02 Gy
Reference Point Session Dosage Given: 2.67 Gy
Session Number: 6

## 2022-06-01 MED ORDER — SONAFINE EX EMUL
1.0000 | Freq: Once | CUTANEOUS | Status: AC
  Administered 2022-06-01: 1 via TOPICAL

## 2022-06-02 ENCOUNTER — Other Ambulatory Visit: Payer: Self-pay

## 2022-06-02 ENCOUNTER — Ambulatory Visit
Admission: RE | Admit: 2022-06-02 | Discharge: 2022-06-02 | Disposition: A | Discharge status: Home / Self Care | Payer: Medicare PPO | Source: Ambulatory Visit | Attending: Radiation Oncology | Admitting: Radiation Oncology

## 2022-06-02 DIAGNOSIS — Z170 Estrogen receptor positive status [ER+]: Secondary | ICD-10-CM | POA: Diagnosis not present

## 2022-06-02 DIAGNOSIS — C50411 Malignant neoplasm of upper-outer quadrant of right female breast: Secondary | ICD-10-CM | POA: Diagnosis not present

## 2022-06-02 DIAGNOSIS — Z510 Encounter for antineoplastic radiation therapy: Secondary | ICD-10-CM | POA: Diagnosis not present

## 2022-06-02 LAB — RAD ONC ARIA SESSION SUMMARY
Course Elapsed Days: 8
Plan Fractions Treated to Date: 7
Plan Prescribed Dose Per Fraction: 2.67 Gy
Plan Total Fractions Prescribed: 15
Plan Total Prescribed Dose: 40.05 Gy
Reference Point Dosage Given to Date: 18.69 Gy
Reference Point Session Dosage Given: 2.67 Gy
Session Number: 7

## 2022-06-03 ENCOUNTER — Ambulatory Visit
Admission: RE | Admit: 2022-06-03 | Discharge: 2022-06-03 | Disposition: A | Discharge status: Home / Self Care | Payer: Medicare PPO | Source: Ambulatory Visit | Attending: Radiation Oncology | Admitting: Radiation Oncology

## 2022-06-03 ENCOUNTER — Other Ambulatory Visit: Payer: Self-pay

## 2022-06-03 DIAGNOSIS — C50411 Malignant neoplasm of upper-outer quadrant of right female breast: Secondary | ICD-10-CM | POA: Diagnosis not present

## 2022-06-03 DIAGNOSIS — Z510 Encounter for antineoplastic radiation therapy: Secondary | ICD-10-CM | POA: Diagnosis not present

## 2022-06-03 DIAGNOSIS — Z170 Estrogen receptor positive status [ER+]: Secondary | ICD-10-CM | POA: Diagnosis not present

## 2022-06-03 LAB — RAD ONC ARIA SESSION SUMMARY
Course Elapsed Days: 9
Plan Fractions Treated to Date: 8
Plan Prescribed Dose Per Fraction: 2.67 Gy
Plan Total Fractions Prescribed: 15
Plan Total Prescribed Dose: 40.05 Gy
Reference Point Dosage Given to Date: 21.36 Gy
Reference Point Session Dosage Given: 2.67 Gy
Session Number: 8

## 2022-06-04 ENCOUNTER — Other Ambulatory Visit: Payer: Self-pay

## 2022-06-04 ENCOUNTER — Ambulatory Visit
Admission: RE | Admit: 2022-06-04 | Discharge: 2022-06-04 | Disposition: A | Discharge status: Home / Self Care | Payer: Medicare PPO | Source: Ambulatory Visit | Attending: Radiation Oncology | Admitting: Radiation Oncology

## 2022-06-04 DIAGNOSIS — Z170 Estrogen receptor positive status [ER+]: Secondary | ICD-10-CM | POA: Diagnosis not present

## 2022-06-04 DIAGNOSIS — C50411 Malignant neoplasm of upper-outer quadrant of right female breast: Secondary | ICD-10-CM | POA: Diagnosis not present

## 2022-06-04 DIAGNOSIS — Z510 Encounter for antineoplastic radiation therapy: Secondary | ICD-10-CM | POA: Diagnosis not present

## 2022-06-04 LAB — RAD ONC ARIA SESSION SUMMARY
Course Elapsed Days: 10
Plan Fractions Treated to Date: 9
Plan Prescribed Dose Per Fraction: 2.67 Gy
Plan Total Fractions Prescribed: 15
Plan Total Prescribed Dose: 40.05 Gy
Reference Point Dosage Given to Date: 24.03 Gy
Reference Point Session Dosage Given: 2.67 Gy
Session Number: 9

## 2022-06-07 ENCOUNTER — Ambulatory Visit
Admission: RE | Admit: 2022-06-07 | Discharge: 2022-06-07 | Disposition: A | Discharge status: Home / Self Care | Payer: Medicare PPO | Source: Ambulatory Visit | Attending: Radiation Oncology | Admitting: Radiation Oncology

## 2022-06-07 ENCOUNTER — Other Ambulatory Visit: Payer: Self-pay

## 2022-06-07 DIAGNOSIS — C50411 Malignant neoplasm of upper-outer quadrant of right female breast: Secondary | ICD-10-CM | POA: Diagnosis not present

## 2022-06-07 DIAGNOSIS — Z170 Estrogen receptor positive status [ER+]: Secondary | ICD-10-CM | POA: Diagnosis not present

## 2022-06-07 DIAGNOSIS — Z510 Encounter for antineoplastic radiation therapy: Secondary | ICD-10-CM | POA: Diagnosis not present

## 2022-06-07 LAB — RAD ONC ARIA SESSION SUMMARY
Course Elapsed Days: 13
Plan Fractions Treated to Date: 10
Plan Prescribed Dose Per Fraction: 2.67 Gy
Plan Total Fractions Prescribed: 15
Plan Total Prescribed Dose: 40.05 Gy
Reference Point Dosage Given to Date: 26.7 Gy
Reference Point Session Dosage Given: 2.67 Gy
Session Number: 10

## 2022-06-08 ENCOUNTER — Ambulatory Visit
Admission: RE | Admit: 2022-06-08 | Discharge: 2022-06-08 | Disposition: A | Discharge status: Home / Self Care | Payer: Medicare PPO | Source: Ambulatory Visit | Attending: Radiation Oncology | Admitting: Radiation Oncology

## 2022-06-08 ENCOUNTER — Other Ambulatory Visit: Payer: Self-pay

## 2022-06-08 DIAGNOSIS — Z170 Estrogen receptor positive status [ER+]: Secondary | ICD-10-CM

## 2022-06-08 DIAGNOSIS — C50411 Malignant neoplasm of upper-outer quadrant of right female breast: Secondary | ICD-10-CM | POA: Diagnosis not present

## 2022-06-08 DIAGNOSIS — Z510 Encounter for antineoplastic radiation therapy: Secondary | ICD-10-CM | POA: Diagnosis not present

## 2022-06-08 LAB — RAD ONC ARIA SESSION SUMMARY
Course Elapsed Days: 14
Plan Fractions Treated to Date: 11
Plan Prescribed Dose Per Fraction: 2.67 Gy
Plan Total Fractions Prescribed: 15
Plan Total Prescribed Dose: 40.05 Gy
Reference Point Dosage Given to Date: 29.37 Gy
Reference Point Session Dosage Given: 2.67 Gy
Session Number: 11

## 2022-06-08 MED ORDER — SONAFINE EX EMUL
1.0000 | Freq: Once | CUTANEOUS | Status: AC
  Administered 2022-06-08: 1 via TOPICAL

## 2022-06-09 ENCOUNTER — Other Ambulatory Visit: Payer: Self-pay

## 2022-06-09 ENCOUNTER — Ambulatory Visit
Admission: RE | Admit: 2022-06-09 | Discharge: 2022-06-09 | Disposition: A | Discharge status: Home / Self Care | Payer: Medicare PPO | Source: Ambulatory Visit | Attending: Radiation Oncology | Admitting: Radiation Oncology

## 2022-06-09 DIAGNOSIS — C50411 Malignant neoplasm of upper-outer quadrant of right female breast: Secondary | ICD-10-CM | POA: Diagnosis not present

## 2022-06-09 DIAGNOSIS — Z170 Estrogen receptor positive status [ER+]: Secondary | ICD-10-CM | POA: Diagnosis not present

## 2022-06-09 DIAGNOSIS — Z510 Encounter for antineoplastic radiation therapy: Secondary | ICD-10-CM | POA: Diagnosis not present

## 2022-06-09 LAB — RAD ONC ARIA SESSION SUMMARY
Course Elapsed Days: 15
Plan Fractions Treated to Date: 12
Plan Prescribed Dose Per Fraction: 2.67 Gy
Plan Total Fractions Prescribed: 15
Plan Total Prescribed Dose: 40.05 Gy
Reference Point Dosage Given to Date: 32.04 Gy
Reference Point Session Dosage Given: 2.67 Gy
Session Number: 12

## 2022-06-10 ENCOUNTER — Ambulatory Visit
Admission: RE | Admit: 2022-06-10 | Discharge: 2022-06-10 | Disposition: A | Discharge status: Home / Self Care | Payer: Medicare PPO | Source: Ambulatory Visit | Attending: Radiation Oncology | Admitting: Radiation Oncology

## 2022-06-10 ENCOUNTER — Other Ambulatory Visit: Payer: Self-pay

## 2022-06-10 DIAGNOSIS — C50411 Malignant neoplasm of upper-outer quadrant of right female breast: Secondary | ICD-10-CM | POA: Insufficient documentation

## 2022-06-10 DIAGNOSIS — Z170 Estrogen receptor positive status [ER+]: Secondary | ICD-10-CM | POA: Diagnosis not present

## 2022-06-10 DIAGNOSIS — Z510 Encounter for antineoplastic radiation therapy: Secondary | ICD-10-CM | POA: Diagnosis not present

## 2022-06-10 LAB — RAD ONC ARIA SESSION SUMMARY
Course Elapsed Days: 16
Plan Fractions Treated to Date: 13
Plan Prescribed Dose Per Fraction: 2.67 Gy
Plan Total Fractions Prescribed: 15
Plan Total Prescribed Dose: 40.05 Gy
Reference Point Dosage Given to Date: 34.71 Gy
Reference Point Session Dosage Given: 2.67 Gy
Session Number: 13

## 2022-06-11 ENCOUNTER — Ambulatory Visit
Admission: RE | Admit: 2022-06-11 | Discharge: 2022-06-11 | Disposition: A | Discharge status: Home / Self Care | Payer: Medicare PPO | Source: Ambulatory Visit | Attending: Radiation Oncology | Admitting: Radiation Oncology

## 2022-06-11 ENCOUNTER — Other Ambulatory Visit: Payer: Self-pay

## 2022-06-11 DIAGNOSIS — Z510 Encounter for antineoplastic radiation therapy: Secondary | ICD-10-CM | POA: Diagnosis not present

## 2022-06-11 DIAGNOSIS — C50411 Malignant neoplasm of upper-outer quadrant of right female breast: Secondary | ICD-10-CM | POA: Diagnosis not present

## 2022-06-11 DIAGNOSIS — Z170 Estrogen receptor positive status [ER+]: Secondary | ICD-10-CM | POA: Diagnosis not present

## 2022-06-11 LAB — RAD ONC ARIA SESSION SUMMARY
Course Elapsed Days: 17
Plan Fractions Treated to Date: 14
Plan Prescribed Dose Per Fraction: 2.67 Gy
Plan Total Fractions Prescribed: 15
Plan Total Prescribed Dose: 40.05 Gy
Reference Point Dosage Given to Date: 37.38 Gy
Reference Point Session Dosage Given: 2.67 Gy
Session Number: 14

## 2022-06-14 ENCOUNTER — Ambulatory Visit
Admission: RE | Admit: 2022-06-14 | Discharge: 2022-06-14 | Disposition: A | Discharge status: Home / Self Care | Payer: Medicare PPO | Source: Ambulatory Visit | Attending: Radiation Oncology | Admitting: Radiation Oncology

## 2022-06-14 ENCOUNTER — Other Ambulatory Visit: Payer: Self-pay

## 2022-06-14 DIAGNOSIS — Z510 Encounter for antineoplastic radiation therapy: Secondary | ICD-10-CM | POA: Diagnosis not present

## 2022-06-14 DIAGNOSIS — C50411 Malignant neoplasm of upper-outer quadrant of right female breast: Secondary | ICD-10-CM | POA: Diagnosis not present

## 2022-06-14 DIAGNOSIS — Z170 Estrogen receptor positive status [ER+]: Secondary | ICD-10-CM | POA: Diagnosis not present

## 2022-06-14 LAB — RAD ONC ARIA SESSION SUMMARY
Course Elapsed Days: 20
Plan Fractions Treated to Date: 15
Plan Prescribed Dose Per Fraction: 2.67 Gy
Plan Total Fractions Prescribed: 15
Plan Total Prescribed Dose: 40.05 Gy
Reference Point Dosage Given to Date: 40.05 Gy
Reference Point Session Dosage Given: 2.67 Gy
Session Number: 15

## 2022-06-15 ENCOUNTER — Ambulatory Visit
Admission: RE | Admit: 2022-06-15 | Discharge: 2022-06-15 | Disposition: A | Discharge status: Home / Self Care | Payer: Medicare PPO | Source: Ambulatory Visit | Attending: Radiation Oncology | Admitting: Radiation Oncology

## 2022-06-15 ENCOUNTER — Other Ambulatory Visit: Payer: Self-pay

## 2022-06-15 DIAGNOSIS — Z170 Estrogen receptor positive status [ER+]: Secondary | ICD-10-CM | POA: Diagnosis not present

## 2022-06-15 DIAGNOSIS — C50411 Malignant neoplasm of upper-outer quadrant of right female breast: Secondary | ICD-10-CM | POA: Diagnosis not present

## 2022-06-15 DIAGNOSIS — Z510 Encounter for antineoplastic radiation therapy: Secondary | ICD-10-CM | POA: Diagnosis not present

## 2022-06-15 LAB — RAD ONC ARIA SESSION SUMMARY
Course Elapsed Days: 21
Plan Fractions Treated to Date: 1
Plan Prescribed Dose Per Fraction: 2 Gy
Plan Total Fractions Prescribed: 6
Plan Total Prescribed Dose: 12 Gy
Reference Point Dosage Given to Date: 2 Gy
Reference Point Session Dosage Given: 2 Gy
Session Number: 16

## 2022-06-15 MED ORDER — SONAFINE EX EMUL
1.0000 | Freq: Two times a day (BID) | CUTANEOUS | Status: DC
  Administered 2022-06-15: 1 via TOPICAL

## 2022-06-16 ENCOUNTER — Other Ambulatory Visit: Payer: Self-pay

## 2022-06-16 ENCOUNTER — Ambulatory Visit
Admission: RE | Admit: 2022-06-16 | Discharge: 2022-06-16 | Disposition: A | Discharge status: Home / Self Care | Payer: Medicare PPO | Source: Ambulatory Visit | Attending: Radiation Oncology | Admitting: Radiation Oncology

## 2022-06-16 DIAGNOSIS — Z170 Estrogen receptor positive status [ER+]: Secondary | ICD-10-CM | POA: Diagnosis not present

## 2022-06-16 DIAGNOSIS — C50411 Malignant neoplasm of upper-outer quadrant of right female breast: Secondary | ICD-10-CM | POA: Diagnosis not present

## 2022-06-16 LAB — RAD ONC ARIA SESSION SUMMARY
Course Elapsed Days: 22
Plan Fractions Treated to Date: 2
Plan Prescribed Dose Per Fraction: 2 Gy
Plan Total Fractions Prescribed: 6
Plan Total Prescribed Dose: 12 Gy
Reference Point Dosage Given to Date: 4 Gy
Reference Point Session Dosage Given: 2 Gy
Session Number: 17

## 2022-06-17 ENCOUNTER — Other Ambulatory Visit: Payer: Self-pay

## 2022-06-17 ENCOUNTER — Ambulatory Visit
Admission: RE | Admit: 2022-06-17 | Discharge: 2022-06-17 | Disposition: A | Discharge status: Home / Self Care | Payer: Medicare PPO | Source: Ambulatory Visit | Attending: Radiation Oncology | Admitting: Radiation Oncology

## 2022-06-17 DIAGNOSIS — Z170 Estrogen receptor positive status [ER+]: Secondary | ICD-10-CM | POA: Diagnosis not present

## 2022-06-17 DIAGNOSIS — C50411 Malignant neoplasm of upper-outer quadrant of right female breast: Secondary | ICD-10-CM | POA: Diagnosis not present

## 2022-06-17 LAB — RAD ONC ARIA SESSION SUMMARY
Course Elapsed Days: 23
Plan Fractions Treated to Date: 3
Plan Prescribed Dose Per Fraction: 2 Gy
Plan Total Fractions Prescribed: 6
Plan Total Prescribed Dose: 12 Gy
Reference Point Dosage Given to Date: 6 Gy
Reference Point Session Dosage Given: 2 Gy
Session Number: 18

## 2022-06-18 ENCOUNTER — Other Ambulatory Visit: Payer: Self-pay

## 2022-06-18 ENCOUNTER — Ambulatory Visit
Admission: RE | Admit: 2022-06-18 | Discharge: 2022-06-18 | Disposition: A | Discharge status: Home / Self Care | Payer: Medicare PPO | Source: Ambulatory Visit | Attending: Radiation Oncology | Admitting: Radiation Oncology

## 2022-06-18 DIAGNOSIS — C50411 Malignant neoplasm of upper-outer quadrant of right female breast: Secondary | ICD-10-CM | POA: Diagnosis not present

## 2022-06-18 DIAGNOSIS — Z170 Estrogen receptor positive status [ER+]: Secondary | ICD-10-CM | POA: Diagnosis not present

## 2022-06-18 LAB — RAD ONC ARIA SESSION SUMMARY
Course Elapsed Days: 24
Plan Fractions Treated to Date: 4
Plan Prescribed Dose Per Fraction: 2 Gy
Plan Total Fractions Prescribed: 6
Plan Total Prescribed Dose: 12 Gy
Reference Point Dosage Given to Date: 8 Gy
Reference Point Session Dosage Given: 2 Gy
Session Number: 19

## 2022-06-21 ENCOUNTER — Ambulatory Visit
Admission: RE | Admit: 2022-06-21 | Discharge: 2022-06-21 | Disposition: A | Discharge status: Home / Self Care | Payer: Medicare PPO | Source: Ambulatory Visit | Attending: Radiation Oncology | Admitting: Radiation Oncology

## 2022-06-21 ENCOUNTER — Other Ambulatory Visit: Payer: Self-pay

## 2022-06-21 DIAGNOSIS — C50411 Malignant neoplasm of upper-outer quadrant of right female breast: Secondary | ICD-10-CM | POA: Diagnosis not present

## 2022-06-21 DIAGNOSIS — Z170 Estrogen receptor positive status [ER+]: Secondary | ICD-10-CM | POA: Diagnosis not present

## 2022-06-21 LAB — RAD ONC ARIA SESSION SUMMARY
Course Elapsed Days: 27
Plan Fractions Treated to Date: 5
Plan Prescribed Dose Per Fraction: 2 Gy
Plan Total Fractions Prescribed: 6
Plan Total Prescribed Dose: 12 Gy
Reference Point Dosage Given to Date: 10 Gy
Reference Point Session Dosage Given: 2 Gy
Session Number: 20

## 2022-06-21 NOTE — Progress Notes (Signed)
Patient Care Team: [name], [name], MD as PCP - General (Internal Medicine) Mauro Kaufmann, RN as Oncology Nurse Navigator Rockwell Germany, RN as Oncology Nurse Navigator Nicholas Lose, MD as Consulting Physician (Hematology and Oncology) Gery Pray, MD as Consulting Physician (Radiation Oncology) Jovita Kussmaul, MD as Consulting Physician (General Surgery)  DIAGNOSIS: No diagnosis found.  SUMMARY OF ONCOLOGIC HISTORY: Oncology History  Malignant neoplasm of upper-outer quadrant of right breast in female, estrogen receptor positive ([place])  03/12/2022 Initial Diagnosis   Screening mammogram detected right breast asymmetry at 12 o'clock position by ultrasound measured 1.2 cm, axilla negative, biopsy revealed grade 2 IDC ER 95% PR 90% HER2 equivalent FISH negative ratio 1.17, Ki-67 20%   03/24/2022 Cancer Staging   Staging form: Breast, AJCC 8th Edition - Clinical stage from 03/24/2022: Stage IA (cT1, cN0, cM0, G2, ER+, PR+, HER2-) - Signed by Nicholas Lose, MD on 03/24/2022 Stage prefix: Initial diagnosis Histologic grading system: 3 grade system Laterality: Right Staged by: Pathologist and managing physician Stage used in treatment planning: Yes National guidelines used in treatment planning: Yes Type of national guideline used in treatment planning: NCCN   04/03/2022 Genetic Testing   Negative genetics--[facility] CustomNext-Cancer +RNAinsight Panel.  Report date is 04/03/2022.   The CustomNext-Cancer+RNAinsight panel offered by [facility] includes sequencing and rearrangement analysis for the following 47 genes:  APC, ATM, AXIN2, BARD1, BMPR1A, BRCA1, BRCA2, BRIP1, CDH1, CDK4, CDKN2A, CHEK2, DICER1, EPCAM, GREM1, HOXB13, MEN1, MLH1, MSH2, MSH3, MSH6, MUTYH, NBN, NF1, NF2, NTHL1, PALB2, PMS2, POLD1, POLE, PTEN, RAD51C, RAD51D, RECQL, RET, SDHA, SDHAF2, SDHB, SDHC, SDHD, SMAD4, SMARCA4, STK11, TP53, TSC1, TSC2, and VHL.  RNA data is routinely analyzed for use in variant  interpretation for all genes.   04/19/2022 Surgery   Right lumpectomy: Grade 2 IDC 1.5 cm, margins negative, 0/4 lymph nodes negative, ER 95%, PR 90%, Ki-67 20%, HER2 negative     CHIEF COMPLIANT: Follow-up on Oncotype test  INTERVAL HISTORY: Jo Garcia is a 67 y.o. female is here because of recent diagnosis of right breast cancer. She presents to the clinic for a follow-up to review Oncotype test.   ALLERGIES:  is allergic to penicillins, sulfa antibiotics, bactrim [sulfamethoxazole-trimethoprim], and codeine.  MEDICATIONS:  Current Outpatient Medications  Medication Sig Dispense Refill   acetaminophen (TYLENOL) 500 MG tablet Take 1,500 mg by mouth as needed. Alternates with Ibuprofen     allopurinol (ZYLOPRIM) 300 MG tablet Take 300 mg by mouth daily.     atorvastatin (LIPITOR) 10 MG tablet Take 10 mg by mouth daily.     benazepril (LOTENSIN) 20 MG tablet Take 20 mg by mouth daily. Alternating days with Benazepril-HCTZ     benazepril-hydrochlorthiazide (LOTENSIN HCT) 20-12.5 MG tablet Take by mouth.     diclofenac sodium (VOLTAREN) 1 % GEL Apply 4 g topically 4 (four) times daily as needed (arthritis).     Ibuprofen 200 MG CAPS Take 400 mg by mouth as needed. Alternates with Tylenol     levothyroxine (SYNTHROID, LEVOTHROID) 75 MCG tablet Take 75 mcg by mouth daily before breakfast.     metoprolol succinate (TOPROL-XL) 50 MG 24 hr tablet Take 50 mg by mouth daily. Take with or immediately following a meal.     naproxen (EC NAPROSYN) 500 MG EC tablet Take 500 mg by mouth 2 (two) times daily with a meal.     No current facility-administered medications for this visit.    PHYSICAL EXAMINATION: ECOG PERFORMANCE STATUS: {CHL ONC ECOG [id number]  There were no vitals filed for this visit. There were no vitals filed for this visit.  BREAST:*** No palpable masses or nodules in either right or left breasts. No palpable axillary supraclavicular or infraclavicular adenopathy no  breast tenderness or nipple discharge. (exam performed in the presence of a chaperone)  LABORATORY DATA:  I have reviewed the data as listed    Latest Ref Rng & Units 04/13/2022    3:00 PM 03/24/2022    8:16 AM 01/19/2022   11:41 AM  CMP  Glucose 70 - 99 mg/dL 92  104  91   BUN 8 - 23 mg/dL 20  25  18   $ Creatinine 0.44 - 1.00 mg/dL 1.21  1.22  1.20   Sodium 135 - 145 mmol/L 139  142  143   Potassium 3.5 - 5.1 mmol/L 3.7  4.2  4.6   Chloride 98 - 111 mmol/L 102  109  106   CO2 22 - 32 mmol/L 29  29  31   $ Calcium 8.9 - 10.3 mg/dL 9.2  9.2  9.8   Total Protein 6.5 - 8.1 g/dL  7.4  7.0   Total Bilirubin 0.3 - 1.2 mg/dL  0.7  0.5   Alkaline Phos 38 - 126 U/L  69    AST 15 - 41 U/L  23  21   ALT 0 - 44 U/L  22  16     Lab Results  Component Value Date   WBC 6.6 03/24/2022   HGB 14.2 03/24/2022   HCT 42.7 03/24/2022   MCV 91.6 03/24/2022   PLT 158 03/24/2022   NEUTROABS 4.2 03/24/2022    ASSESSMENT & PLAN:  No problem-specific Assessment & Plan notes found for this encounter.    No orders of the defined types were placed in this encounter.  The patient has a good understanding of the overall plan. she agrees with it. she will call with any problems that may develop before the next visit here. Total time spent: 30 mins including face to face time and time spent for planning, charting and co-ordination of care   Suzzette Righter, [place] 06/21/22    I Gardiner Coins am acting as a Education administrator for [employer]  ***

## 2022-06-22 ENCOUNTER — Ambulatory Visit
Admission: RE | Admit: 2022-06-22 | Discharge: 2022-06-22 | Disposition: A | Discharge status: Home / Self Care | Payer: Medicare PPO | Source: Ambulatory Visit | Attending: Radiation Oncology | Admitting: Radiation Oncology

## 2022-06-22 ENCOUNTER — Inpatient Hospital Stay: Payer: Medicare PPO | Attending: Hematology and Oncology | Admitting: Hematology and Oncology

## 2022-06-22 ENCOUNTER — Other Ambulatory Visit: Payer: Self-pay

## 2022-06-22 ENCOUNTER — Encounter: Payer: Self-pay | Admitting: *Deleted

## 2022-06-22 VITALS — BP 115/67 | HR 58 | Temp 98.0°F | Resp 16 | Wt 169.2 lb

## 2022-06-22 DIAGNOSIS — Z170 Estrogen receptor positive status [ER+]: Secondary | ICD-10-CM

## 2022-06-22 DIAGNOSIS — C50411 Malignant neoplasm of upper-outer quadrant of right female breast: Secondary | ICD-10-CM | POA: Diagnosis not present

## 2022-06-22 DIAGNOSIS — Z510 Encounter for antineoplastic radiation therapy: Secondary | ICD-10-CM | POA: Insufficient documentation

## 2022-06-22 LAB — RAD ONC ARIA SESSION SUMMARY
Course Elapsed Days: 28
Plan Fractions Treated to Date: 6
Plan Prescribed Dose Per Fraction: 2 Gy
Plan Total Fractions Prescribed: 6
Plan Total Prescribed Dose: 12 Gy
Reference Point Dosage Given to Date: 12 Gy
Reference Point Session Dosage Given: 2 Gy
Session Number: 21

## 2022-06-22 MED ORDER — ANASTROZOLE 1 MG PO TABS: 1.0000 mg | ORAL_TABLET | Freq: Every day | ORAL | 3 refills | Status: DC

## 2022-06-22 MED ORDER — SONAFINE EX EMUL
1.0000 | Freq: Once | CUTANEOUS | Status: AC
  Administered 2022-06-22: 1 via TOPICAL

## 2022-06-22 NOTE — Assessment & Plan Note (Signed)
04/19/2022:Right lumpectomy: Grade 2 IDC 1.5 cm, margins negative, 0/4 lymph nodes negative, ER 95%, PR 90%, Ki-67 20%, HER2 negative   Treatment plan: Oncotype DX score 8 (ROR 3%) Adjuvant radiation therapy 05/26/2022-06/22/2022 Adjuvant antiestrogen therapy ------------------------------------------------------------------------------------------------------------------------------- Anastrozole counseling: We discussed the risks and benefits of anti-estrogen therapy with aromatase inhibitors. These include but not limited to insomnia, hot flashes, mood changes, vaginal dryness, bone density loss, and weight gain. We strongly believe that the benefits far outweigh the risks. Patient understands these risks and consented to starting treatment. Planned treatment duration is 5-7 years.  Day after her surgery her husband had a cerebellar stroke  Follow-up in 3 months for survivorship care plan visit

## 2022-06-24 NOTE — Radiation Completion Notes (Signed)
Patient Name: Jo Garcia, MANSKE MRN: [id number] Date of Birth: 1955/07/22 Referring Physician: Nicholas Lose, M.D. Date of Service: 2022-06-24 Radiation Oncologist: Teryl Lucy, M.D. [place]                             RADIATION ONCOLOGY END OF TREATMENT NOTE     Diagnosis: C50.411 Malignant neoplasm of upper-outer quadrant of right female breast Staging on 2022-03-24: Malignant neoplasm of upper-outer quadrant of right breast in female, estrogen receptor positive ([place]) T=cT1, N=cN0, M=cM0 Intent: Curative     ==========DELIVERED PLANS==========  First Treatment Date: 2022-05-25 - Last Treatment Date: 2022-06-22   Plan Name: Breast_R Site: Breast, Right Technique: 3D Mode: Photon Dose Per Fraction: 2.67 Gy Prescribed Dose (Delivered / Prescribed): 40.05 Gy / 40.05 Gy Prescribed Fxs (Delivered / Prescribed): 15 / 15   Plan Name: Breast_R_Bst Site: Breast, Right Technique: Electron Mode: Electron Dose Per Fraction: 2 Gy Prescribed Dose (Delivered / Prescribed): 12 Gy / 12 Gy Prescribed Fxs (Delivered / Prescribed): 6 / 6     ==========ON TREATMENT VISIT DATES========== 2022-05-25, 2022-06-01, 2022-06-08, 2022-06-15, 2022-06-22     ==========UPCOMING VISITS==========       ==========APPENDIX - ON TREATMENT VISIT NOTES==========   See weekly On Treatment Notes is Epic for details.

## 2022-07-06 NOTE — Progress Notes (Signed)
Office Visit Note  Patient: Jo Garcia             Date of Birth: March 13, 1956           MRN: [id number]             PCP: Haywood Pao, MD Referring: Haywood Pao, MD Visit Date: 07/20/2022 Occupation: '@GUAROCC'$ @  Subjective:  Pain in multiple joints   History of Present Illness: Jo Garcia is a 67 y.o. female with history of osteoarthritis, degenerative disc disease and gouty arthropathy.  She states on allopurinol 300 mg p.o. daily.  Not had a gout flare since the last visit.  She continues to have some discomfort in her hands especially her right thumb.  She has intermittent discomfort in her knees and her feet.  She states she was having some neuropathy in her right lower extremity which still intermittently bothers her.  She continues to have some discomfort in her lower back.  She was diagnosed with breast cancer in November 2023.  She underwent lumpectomy, lymph node resection followed by radiation therapy.  No chemotherapy was required.  She is currently on anastrozole which she has been tolerating well.    Activities of Daily Living:  Patient reports morning stiffness for 24 hours.   Patient Reports nocturnal pain.  Difficulty dressing/grooming: Reports Difficulty climbing stairs: Denies Difficulty getting out of chair: Denies Difficulty using hands for taps, buttons, cutlery, and/or writing: Reports  Review of Systems  Constitutional:  Negative for fatigue.  HENT:  Negative for mouth sores and mouth dryness.   Eyes:  Negative for dryness.  Respiratory:  Negative for shortness of breath.   Cardiovascular:  Negative for chest pain and palpitations.  Gastrointestinal:  Negative for blood in stool, constipation and diarrhea.  Endocrine: Negative for increased urination.  Genitourinary:  Negative for involuntary urination.  Musculoskeletal:  Positive for joint pain, joint pain, joint swelling and morning stiffness. Negative for gait problem, myalgias,  muscle weakness, muscle tenderness and myalgias.  Skin:  Positive for sensitivity to sunlight. Negative for color change, rash and hair loss.  Allergic/Immunologic: Negative for susceptible to infections.  Neurological:  Negative for dizziness and headaches.  Hematological:  Negative for swollen glands.  Psychiatric/Behavioral:  Negative for depressed mood and sleep disturbance. The patient is not nervous/anxious.     PMFS History:  Patient Active Problem List   Diagnosis Date Noted   Genetic testing 04/06/2022   Family history of breast cancer 03/24/2022   Family history of prostate cancer 03/24/2022   Family history of colon cancer 03/24/2022   Malignant neoplasm of upper-outer quadrant of right breast in female, estrogen receptor positive ([place]) 03/22/2022   Medial epicondylitis of both elbows 01/18/2020   Dyslipidemia 11/04/2016   Osteoarthritis of foot 04/30/2016   History of hypertension 04/30/2016   History of renal calculi 04/30/2016   Vitamin D deficiency 04/30/2016   History of hypothyroidism 04/30/2016   Primary osteoarthritis of both hands 04/30/2016   Primary osteoarthritis of both knees 04/30/2016   Idiopathic chronic gout of multiple sites without tophus 04/30/2016    Past Medical History:  Diagnosis Date   Anemia    Breast cancer ([place])    Chronic kidney disease    uric acid kidney stones   Family history of breast cancer 03/24/2022   Family history of colon cancer 03/24/2022   Family history of prostate cancer 03/24/2022   GERD (gastroesophageal reflux disease)    past hx - uses ranitadine  OTC prn only    Gout    Hyperlipidemia    Hypertension    Lumbar spine pain    Neuromuscular disorder (HCC)    hiatal hernia   Osteoarthritis    thumbs, big toe left foot, top both feet   PONV (postoperative nausea and vomiting)    Thyroid disease     Family History  Problem Relation Age of Onset   Rheum arthritis Mother    Tuberculosis Mother        "spinal  tuberculosis"?   Heart attack Father    Breast cancer Maternal Aunt        dx 33s   Prostate cancer Maternal Uncle        mets; d. 28s   Rheum arthritis Daughter    Hypertension Daughter    Healthy Son    Colon cancer Cousin        maternal cousins x4; one dx before age 61; others dx after age 1   Colon polyps Neg Hx    Esophageal cancer Neg Hx    Rectal cancer Neg Hx    Stomach cancer Neg Hx    Past Surgical History:  Procedure Laterality Date   ABLATION     BREAST BIOPSY  04/16/2022   [place] RT RADIOACTIVE SEED LOC 04/16/2022 [facility]AMMOGRAP[facility]   BREAST LUMPECTOMY WITH RADIOACTIVE SEED AND SENTINEL LYMPH NODE BIOPSY Right 04/19/2022   Procedure: RIGHT BREAST LUMPECTOMY WITH RADIOACTIVE SEED AND SENTINEL LYMPH NODE BIOPSY;  Surgeon: Jovita Kussmaul, MD;  Location: [place];  Service: General;  Laterality: Right;   COLONOSCOPY  2018   KN-MAC-suprep(exc)-tics   DILATION AND CURETTAGE OF UTERUS     no anesthesia during this procedure   EYE SURGERY Bilateral 12/2017   eyelid surgery    SHOULDER SURGERY Right    repair of tear    Social History   Social History Narrative   Lives with husband   Right handed   Caffeine: occasionally tea or coke    Immunization History  Administered Date(s) Administered   PFIZER(Purple Top)SARS-COV-2 Vaccination 07/25/2019, 08/15/2019, 02/11/2020     Objective: Vital Signs: BP 117/78 (BP Location: Left Arm, Patient Position: Sitting, Cuff Size: Normal)   Pulse 61   Resp 16   Ht '5\' 4"'$  (1.626 m)   Wt 168 lb (76.2 kg)   BMI 28.84 kg/m    Physical Exam Vitals and nursing note reviewed.  Constitutional:      Appearance: She is well-developed.  HENT:     Head: Normocephalic and atraumatic.  Eyes:     Conjunctiva/sclera: Conjunctivae normal.  Cardiovascular:     Rate and Rhythm: Normal rate and regular rhythm.     Heart sounds: Normal heart sounds.  Pulmonary:     Effort: Pulmonary effort is normal.     Breath sounds:  Normal breath sounds.  Abdominal:     General: Bowel sounds are normal.     Palpations: Abdomen is soft.  Musculoskeletal:     Cervical back: Normal range of motion.  Lymphadenopathy:     Cervical: No cervical adenopathy.  Skin:    General: Skin is warm and dry.     Capillary Refill: Capillary refill takes less than 2 seconds.  Neurological:     Mental Status: She is alert and oriented to person, place, and time.  Psychiatric:        Behavior: Behavior normal.      Musculoskeletal Exam: Cervical spine was in good range of  motion.  She had no tenderness over thoracic or lumbar spine.  Shoulders, elbow joints, wrist joints were in good range of motion.  She had bilateral CMC PIP and DIP thickening with no synovitis.  Hip joints and knee joints in good range of motion without any warmth swelling or effusion.  There was no tenderness over ankles or MTPs.  CDAI Exam: CDAI Score: -- Patient Global: --; Provider Global: -- Swollen: --; Tender: -- Joint Exam 07/20/2022   No joint exam has been documented for this visit   There is currently no information documented on the homunculus. Go to the Rheumatology activity and complete the homunculus joint exam.  Investigation: No additional findings.  Imaging: No results found.  Recent Labs: Lab Results  Component Value Date   WBC 6.6 03/24/2022   HGB 14.2 03/24/2022   PLT 158 03/24/2022   NA 139 04/13/2022   K 3.7 04/13/2022   CL 102 04/13/2022   CO2 29 04/13/2022   GLUCOSE 92 04/13/2022   BUN 20 04/13/2022   CREATININE 1.21 (H) 04/13/2022   BILITOT 0.7 03/24/2022   ALKPHOS 69 03/24/2022   AST 23 03/24/2022   ALT 22 03/24/2022   PROT 7.4 03/24/2022   ALBUMIN 4.1 03/24/2022   CALCIUM 9.2 04/13/2022   GFRAA 52 (L) 07/18/2020  January 19, 2022 uric acid 4.0  Speciality Comments: No specialty comments available.  Procedures:  No procedures performed Allergies: Penicillins; Radiaplexrx [skin protectants, misc.]; Sulfa  antibiotics; Bactrim [sulfamethoxazole-trimethoprim]; and Codeine   Assessment / Plan:     Visit Diagnoses: Idiopathic chronic gout of multiple sites without tophus -patient denies having a flare of gout since the last visit.  She has been taking allopurinol 300 mg p.o. daily.  01/31/22 uric acid 4.0.  Patient will have labs with Dr. Osborne Casco in the next few months for a physical.  I advised her to get uric acid level with the next labs.  Medication monitoring encounter - Allopurinol 300 mg p.o. daily.  Labs from March 24, 2022 CBC was normal, CMP showed creatinine elevated at 1.22 which is stable.  Primary osteoarthritis of both hands-she had bilateral CMC, PIP and DIP thickening.  She has been having discomfort in her CMC joints.  A prescription for right CMC brace was given.  Joint protection muscle strengthening was advised.  Primary osteoarthritis of both knees-she continues to have some discomfort in her bilateral knee joints.  No warmth swelling or effusion was noted.  Lower extremity muscle strength exercises were demonstrated and discussed.  Primary osteoarthritis of both feet-proper fitting shoes were advised.  She has dorsal spurs and hammertoes which are bothersome.  DDD (degenerative disc disease), lumbar - L4-L5 spondylolisthesis, mild to moderate spinal stenosis at L4-5 and facet joint arthropathy.  Core strength exercises were advised.  Malignant neoplasm of upper-outer quadrant of right breast in female, estrogen receptor positive ([place]) -diagnosed 03/2022, s/p lumpectomy, LN resection, RTX, she is on Anasrrozole.  History of hypertension-her blood pressure was normal at 117/78.  History of renal calculi  Dyslipidemia  History of hypothyroidism  History of vitamin D deficiency  Osteoporosis screening-pending.  Patient will schedule through her PCP.  Orders: No orders of the defined types were placed in this encounter.  No orders of the defined types were placed in  this encounter.    Follow-Up Instructions: Return in about 6 months (around 01/20/2023) for Osteoarthritis, Gout.   Bo Merino, MD  Note - This record has been created using Editor, commissioning.  Chart  creation errors have been sought, but may not always  have been located. Such creation errors do not reflect on  the standard of medical care.

## 2022-07-20 ENCOUNTER — Encounter: Payer: Self-pay | Admitting: Rheumatology

## 2022-07-20 ENCOUNTER — Ambulatory Visit: Payer: Medicare PPO | Attending: Rheumatology | Admitting: Rheumatology

## 2022-07-20 VITALS — BP 117/78 | HR 61 | Resp 16 | Ht 64.0 in | Wt 168.0 lb

## 2022-07-20 DIAGNOSIS — M170 Bilateral primary osteoarthritis of knee: Secondary | ICD-10-CM | POA: Diagnosis not present

## 2022-07-20 DIAGNOSIS — M19041 Primary osteoarthritis, right hand: Secondary | ICD-10-CM

## 2022-07-20 DIAGNOSIS — M19042 Primary osteoarthritis, left hand: Secondary | ICD-10-CM

## 2022-07-20 DIAGNOSIS — Z8639 Personal history of other endocrine, nutritional and metabolic disease: Secondary | ICD-10-CM

## 2022-07-20 DIAGNOSIS — C50411 Malignant neoplasm of upper-outer quadrant of right female breast: Secondary | ICD-10-CM | POA: Diagnosis not present

## 2022-07-20 DIAGNOSIS — Z87442 Personal history of urinary calculi: Secondary | ICD-10-CM

## 2022-07-20 DIAGNOSIS — Z5181 Encounter for therapeutic drug level monitoring: Secondary | ICD-10-CM | POA: Diagnosis not present

## 2022-07-20 DIAGNOSIS — M19071 Primary osteoarthritis, right ankle and foot: Secondary | ICD-10-CM

## 2022-07-20 DIAGNOSIS — M5136 Other intervertebral disc degeneration, lumbar region: Secondary | ICD-10-CM

## 2022-07-20 DIAGNOSIS — M19072 Primary osteoarthritis, left ankle and foot: Secondary | ICD-10-CM

## 2022-07-20 DIAGNOSIS — Z8679 Personal history of other diseases of the circulatory system: Secondary | ICD-10-CM

## 2022-07-20 DIAGNOSIS — M1A09X0 Idiopathic chronic gout, multiple sites, without tophus (tophi): Secondary | ICD-10-CM

## 2022-07-20 DIAGNOSIS — E785 Hyperlipidemia, unspecified: Secondary | ICD-10-CM

## 2022-07-20 DIAGNOSIS — Z170 Estrogen receptor positive status [ER+]: Secondary | ICD-10-CM

## 2022-07-20 DIAGNOSIS — Z13820 Encounter for screening for osteoporosis: Secondary | ICD-10-CM

## 2022-07-20 NOTE — Patient Instructions (Addendum)
Please get uric acid with your next labs at your physical. Please schedule DEXA scan through Dr. Osborne Casco to screen for osteoporosis

## 2022-07-21 ENCOUNTER — Encounter: Payer: Self-pay | Admitting: Radiation Oncology

## 2022-07-21 NOTE — Progress Notes (Signed)
  Radiation Oncology         [phone] ________________________________  Patient Name: Jo Garcia MRN: [id number] DOB: 04-18-1956 Referring Physician: Domenick Gong (Profile Not Attached) Date of Service: 06/22/2022 [facility] [place], [place]                                                        End Of Treatment Note  Diagnoses: C50.411-Malignant neoplasm of upper-outer quadrant of right female breast  Cancer Staging:  Stage IA (pT1c, pN0, cM0) Right Breast UOQ, Invasive Ductal Carcinoma, ER+ / PR+ / Her2-, Grade 2 : s/p lumpectomy and SLN biopsies with clean margins and negative nodes    Intent: Curative  Radiation Treatment Dates: 05/25/2022 through 06/22/2022 Site Technique Total Dose (Gy) Dose per Fx (Gy) Completed Fx Beam Energies  Breast, Right: Breast_R 3D 40.05/40.05 2.67 15/15 6XFFF, 10XFFF  Breast, Right: Breast_R_Bst specialPort 12/12 2 6/6 12E, 15E   Narrative: The patient tolerated radiation therapy relatively well. On the date of her final treatment, the patient endorsed right breast tenderness with palpation, fatigue, and skin irritation that feels like a sun burn.  She is also reported cutting back on her walking from 2 miles a day to a half a mile. Physical exam performed that same day showed erythema and some hyperpigmentation changes to the right breast area. No skin breakdown was appreciated.   Plan: The patient will follow-up with radiation oncology in one month .  ________________________________________________ -----------------------------------  Blair Promise, PhD, MD  This document serves as a record of services personally performed by Gery Pray, MD. It was created on his behalf by Roney Mans, a trained medical scribe. The creation of this record is based on the scribe's personal observations and the provider's statements to them. This document has been checked and approved by the attending provider.

## 2022-07-21 NOTE — Progress Notes (Signed)
Radiation Oncology         [phone] ________________________________  Name: Jo Garcia MRN: [id number]  Date: 07/22/2022  DOB: 1956/04/18  Follow-Up Visit Note  CC: [name], [name], MD  [name], [name], MD  No diagnosis found.  Diagnosis: Stage IA (pT1c, pN0, cM0) Right Breast UOQ, Invasive Ductal Carcinoma, ER+ / PR+ / Her2-, Grade 2 : s/p lumpectomy and SLN biopsies with clean margins and negative nodes     Interval Since Last Radiation: 1 month and 1 day  Intent: Curative  Radiation Treatment Dates: 05/25/2022 through 06/22/2022 Site Technique Total Dose (Gy) Dose per Fx (Gy) Completed Fx Beam Energies  Breast, Right: Breast_R 3D 40.05/40.05 2.67 15/15 6XFFF, 10XFFF  Breast, Right: Breast_R_Bst specialPort 12/12 2 6/6 12E, 15E   Narrative:  The patient returns today for routine follow-up. The patient tolerated radiation therapy relatively well. On the date of her final treatment, the patient endorsed right breast tenderness with palpation, fatigue, and skin irritation that feels like a sun burn.  She is also reported cutting back on her walking from 2 miles a day to a half a mile. Physical exam performed that same day showed erythema and some hyperpigmentation changes to the right breast area. No skin breakdown was appreciated.          On the day of her final treatment, the patient also followed up with Dr. Lindi Adie on 06/22/22. During which time, the patient complained of persistent tenderness and radiation dermatitis. In terms of further adjuvant treatment, the patient has opted to proceed with antiestrogen therapy consisting of anastrozole.    ***                        Allergies:  is allergic to penicillins; radiaplexrx [skin protectants, misc.]; sulfa antibiotics; bactrim [sulfamethoxazole-trimethoprim]; and codeine.  Meds: Current Outpatient Medications  Medication Sig Dispense Refill   acetaminophen (TYLENOL) 500 MG tablet Take 1,500 mg by mouth as needed.  Alternates with Ibuprofen     allopurinol (ZYLOPRIM) 300 MG tablet Take 300 mg by mouth daily.     anastrozole (ARIMIDEX) 1 MG tablet Take 1 tablet (1 mg total) by mouth daily. 90 tablet 3   atorvastatin (LIPITOR) 10 MG tablet Take 10 mg by mouth daily.     benazepril (LOTENSIN) 20 MG tablet Take 20 mg by mouth daily. Alternating days with Benazepril-HCTZ     benazepril-hydrochlorthiazide (LOTENSIN HCT) 20-12.5 MG tablet Take by mouth.     diclofenac sodium (VOLTAREN) 1 % GEL Apply 4 g topically 4 (four) times daily as needed (arthritis).     Ibuprofen 200 MG CAPS Take 400 mg by mouth as needed. Alternates with Tylenol     levothyroxine (SYNTHROID, LEVOTHROID) 75 MCG tablet Take 75 mcg by mouth daily before breakfast.     metoprolol succinate (TOPROL-XL) 50 MG 24 hr tablet Take 50 mg by mouth daily. Take with or immediately following a meal.     No current facility-administered medications for this encounter.    Physical Findings: The patient is in no acute distress. Patient is alert and oriented.  vitals were not taken for this visit. .  No significant changes. Lungs are clear to auscultation bilaterally. Heart has regular rate and rhythm. No palpable cervical, supraclavicular, or axillary adenopathy. Abdomen soft, non-tender, normal bowel sounds.  Left Breast: no palpable mass, nipple discharge or bleeding. Right Breast: ***  Lab Findings: Lab Results  Component Value Date   WBC 6.6  03/24/2022   HGB 14.2 03/24/2022   HCT 42.7 03/24/2022   MCV 91.6 03/24/2022   PLT 158 03/24/2022    Radiographic Findings: No results found.  Impression: Stage IA (pT1c, pN0, cM0) Right Breast UOQ, Invasive Ductal Carcinoma, ER+ / PR+ / Her2-, Grade 2 : s/p lumpectomy and SLN biopsies with clean margins and negative nodes     The patient is recovering from the effects of radiation.  ***  Plan:  ***   *** minutes of total time was spent for this patient encounter, including preparation,  face-to-face counseling with the patient and coordination of care, physical exam, and documentation of the encounter. ____________________________________  Blair Promise, PhD, MD  This document serves as a record of services personally performed by Gery Pray, MD. It was created on his behalf by Roney Mans, a trained medical scribe. The creation of this record is based on the scribe's personal observations and the provider's statements to them. This document has been checked and approved by the attending provider.

## 2022-07-22 ENCOUNTER — Encounter: Payer: Self-pay | Admitting: Radiation Oncology

## 2022-07-22 ENCOUNTER — Ambulatory Visit
Admission: RE | Admit: 2022-07-22 | Discharge: 2022-07-22 | Disposition: A | Discharge status: Home / Self Care | Payer: Medicare PPO | Source: Ambulatory Visit | Attending: Radiation Oncology | Admitting: Radiation Oncology

## 2022-07-22 VITALS — BP 113/70 | HR 50 | Temp 97.0°F | Resp 18 | Ht 64.0 in | Wt 168.5 lb

## 2022-07-22 DIAGNOSIS — Z923 Personal history of irradiation: Secondary | ICD-10-CM | POA: Diagnosis not present

## 2022-07-22 DIAGNOSIS — C50411 Malignant neoplasm of upper-outer quadrant of right female breast: Secondary | ICD-10-CM | POA: Insufficient documentation

## 2022-07-22 DIAGNOSIS — Z170 Estrogen receptor positive status [ER+]: Secondary | ICD-10-CM | POA: Diagnosis not present

## 2022-07-22 HISTORY — DX: Personal history of irradiation: Z92.3

## 2022-07-22 NOTE — Progress Notes (Signed)
Jo Garcia is here today for follow up post radiation to the breast.   Breast Side:RIght   They completed their radiation on: 06/22/22   Does the patient complain of any of the following: Post radiation skin issues: Improving.  Breast Tenderness: Yes, only if breast is touched.  Breast Swelling: No Lymphadema: No Range of Motion limitations: No Fatigue post radiation: Yes, comes and goes.  Appetite good/fair/poor: Good  Additional comments if applicable:   BP 123XX123 (BP Location: Left Arm, Patient Position: Sitting)   Pulse (!) 50   Temp (!) 97 F (36.1 C) (Temporal)   Resp 18   Ht '5\' 4"'$  (1.626 m)   Wt 168 lb 8 oz (76.4 kg)   SpO2 99%   BMI 28.92 kg/m

## 2022-08-16 ENCOUNTER — Ambulatory Visit: Payer: Medicare PPO | Attending: General Surgery

## 2022-08-16 VITALS — Wt 169.5 lb

## 2022-08-16 DIAGNOSIS — Z483 Aftercare following surgery for neoplasm: Secondary | ICD-10-CM | POA: Insufficient documentation

## 2022-08-16 NOTE — Therapy (Signed)
OUTPATIENT PHYSICAL THERAPY [facility]ZO SCREENING NOTE   Patient Name: Jo Garcia MRN: [id number] DOB:09-16-1955, 67 y.o., female Today's Date: 08/16/2022  PCP: Gaspar Garbe, MD REFERRING PROVIDER: Griselda Miner, MD   PT End of Session - 08/16/22 1023     Visit Number 2   # unchanged due to screen only   PT Start Time 1021    PT Stop Time 1025    PT Time Calculation (min) 4 min    Activity Tolerance Patient tolerated treatment well    Behavior During Therapy [facility] for tasks assessed/performed             Past Medical History:  Diagnosis Date   Anemia    Breast cancer (HCC)    Chronic kidney disease    uric acid kidney stones   Family history of breast cancer 03/24/2022   Family history of colon cancer 03/24/2022   Family history of prostate cancer 03/24/2022   GERD (gastroesophageal reflux disease)    past hx - uses ranitadine OTC prn only    Gout    History of radiation therapy    Right breast- 05/25/22-06/22/22- Dr. Antony Blackbird   Hyperlipidemia    Hypertension    Lumbar spine pain    Neuromuscular disorder (HCC)    hiatal hernia   Osteoarthritis    thumbs, big toe left foot, top both feet   PONV (postoperative nausea and vomiting)    Thyroid disease    Past Surgical History:  Procedure Laterality Date   ABLATION     BREAST BIOPSY  04/16/2022   [place] RT RADIOACTIVE SEED LOC 04/16/2022 [facility]MOGR[facility]   BREAST LUMPECTOMY WITH RADIOACTIVE SEED AND SENTINEL LYMPH NODE BIOPSY Right 04/19/2022   Procedure: RIGHT BREAST LUMPECTOMY WITH RADIOACTIVE SEED AND SENTINEL LYMPH NODE BIOPSY;  Surgeon: Griselda Miner, MD;  Location: [facility];  Service: General;  Laterality: Right;   COLONOSCOPY  2018   KN-MAC-suprep(exc)-tics   DILATION AND CURETTAGE OF UTERUS     no anesthesia during this procedure   EYE SURGERY Bilateral 12/2017   eyelid surgery    SHOULDER SURGERY Right    repair of tear    Patient Active Problem List   Diagnosis Date Noted    Genetic testing 04/06/2022   Family history of breast cancer 03/24/2022   Family history of prostate cancer 03/24/2022   Family history of colon cancer 03/24/2022   Malignant neoplasm of upper-outer quadrant of right breast in female, estrogen receptor positive 03/22/2022   Medial epicondylitis of both elbows 01/18/2020   Dyslipidemia 11/04/2016   Osteoarthritis of foot 04/30/2016   History of hypertension 04/30/2016   History of renal calculi 04/30/2016   Vitamin D deficiency 04/30/2016   History of hypothyroidism 04/30/2016   Primary osteoarthritis of both hands 04/30/2016   Primary osteoarthritis of both knees 04/30/2016   Idiopathic chronic gout of multiple sites without tophus 04/30/2016    REFERRING DIAG: right breast cancer at risk for lymphedema  THERAPY DIAG:  Aftercare following surgery for neoplasm  PERTINENT HISTORY: Patient was diagnosed on 02/22/2022 with right grade 2 invasive ductal carcinoma breast cancer. It measures 1.2 cm and is located in the upper inner quadrant. It is ER/PR positive and HER2 negative with a Ki67 of 20%. She had a Rt lumpectomy and SLNB on 04/19/22 with 4 negative nodes removed.  Will be having radiation   PRECAUTIONS: right UE Lymphedema risk, None  SUBJECTIVE: Pt returns for her 3 month L-Dex screen.  PAIN:  Are you having pain? No  SOZO SCREENING: Patient was assessed today using the SOZO machine to determine the lymphedema index score. This was compared to her baseline score. It was determined that she is within the recommended range when compared to her baseline and no further action is needed at this time. She will continue SOZO screenings. These are done every 3 months for 2 years post operatively followed by every 6 months for 2 years, and then annually.   L-DEX FLOWSHEETS - 08/16/22 1000       L-DEX LYMPHEDEMA SCREENING   Measurement Type Unilateral    L-DEX MEASUREMENT EXTREMITY Upper Extremity    POSITION  Standing     DOMINANT SIDE Right    At Risk Side Right    BASELINE SCORE (UNILATERAL) -0.7    L-DEX SCORE (UNILATERAL) 0.1    VALUE CHANGE (UNILAT) 0.8               Hermenia BersRosenberger, Anitra Doxtater Ann, PTA 08/16/2022, 10:27 AM

## 2022-09-08 DIAGNOSIS — R739 Hyperglycemia, unspecified: Secondary | ICD-10-CM | POA: Diagnosis not present

## 2022-09-08 DIAGNOSIS — E039 Hypothyroidism, unspecified: Secondary | ICD-10-CM | POA: Diagnosis not present

## 2022-09-08 DIAGNOSIS — E7800 Pure hypercholesterolemia, unspecified: Secondary | ICD-10-CM | POA: Diagnosis not present

## 2022-09-08 DIAGNOSIS — K219 Gastro-esophageal reflux disease without esophagitis: Secondary | ICD-10-CM | POA: Diagnosis not present

## 2022-09-08 DIAGNOSIS — I10 Essential (primary) hypertension: Secondary | ICD-10-CM | POA: Diagnosis not present

## 2022-09-08 DIAGNOSIS — M109 Gout, unspecified: Secondary | ICD-10-CM | POA: Diagnosis not present

## 2022-09-15 DIAGNOSIS — R739 Hyperglycemia, unspecified: Secondary | ICD-10-CM | POA: Diagnosis not present

## 2022-09-15 DIAGNOSIS — M1990 Unspecified osteoarthritis, unspecified site: Secondary | ICD-10-CM | POA: Diagnosis not present

## 2022-09-15 DIAGNOSIS — E039 Hypothyroidism, unspecified: Secondary | ICD-10-CM | POA: Diagnosis not present

## 2022-09-15 DIAGNOSIS — R82998 Other abnormal findings in urine: Secondary | ICD-10-CM | POA: Diagnosis not present

## 2022-09-15 DIAGNOSIS — I10 Essential (primary) hypertension: Secondary | ICD-10-CM | POA: Diagnosis not present

## 2022-09-15 DIAGNOSIS — C50911 Malignant neoplasm of unspecified site of right female breast: Secondary | ICD-10-CM | POA: Diagnosis not present

## 2022-09-15 DIAGNOSIS — Z0000 Encounter for general adult medical examination without abnormal findings: Secondary | ICD-10-CM | POA: Diagnosis not present

## 2022-09-15 DIAGNOSIS — E663 Overweight: Secondary | ICD-10-CM | POA: Diagnosis not present

## 2022-09-15 DIAGNOSIS — R718 Other abnormality of red blood cells: Secondary | ICD-10-CM | POA: Diagnosis not present

## 2022-09-16 ENCOUNTER — Encounter: Payer: Self-pay | Admitting: Adult Health

## 2022-09-16 ENCOUNTER — Inpatient Hospital Stay: Payer: Medicare PPO | Attending: Hematology and Oncology | Admitting: Adult Health

## 2022-09-16 ENCOUNTER — Inpatient Hospital Stay: Payer: Medicare PPO

## 2022-09-16 ENCOUNTER — Other Ambulatory Visit: Payer: Self-pay

## 2022-09-16 ENCOUNTER — Telehealth: Payer: Self-pay | Admitting: Adult Health

## 2022-09-16 VITALS — BP 113/68 | HR 53 | Temp 98.1°F | Resp 16 | Wt 169.0 lb

## 2022-09-16 DIAGNOSIS — Z79811 Long term (current) use of aromatase inhibitors: Secondary | ICD-10-CM | POA: Diagnosis not present

## 2022-09-16 DIAGNOSIS — Z923 Personal history of irradiation: Secondary | ICD-10-CM | POA: Diagnosis not present

## 2022-09-16 DIAGNOSIS — Z170 Estrogen receptor positive status [ER+]: Secondary | ICD-10-CM | POA: Diagnosis not present

## 2022-09-16 DIAGNOSIS — E559 Vitamin D deficiency, unspecified: Secondary | ICD-10-CM | POA: Insufficient documentation

## 2022-09-16 DIAGNOSIS — C50411 Malignant neoplasm of upper-outer quadrant of right female breast: Secondary | ICD-10-CM | POA: Insufficient documentation

## 2022-09-16 DIAGNOSIS — Z79899 Other long term (current) drug therapy: Secondary | ICD-10-CM | POA: Diagnosis not present

## 2022-09-16 DIAGNOSIS — M8580 Other specified disorders of bone density and structure, unspecified site: Secondary | ICD-10-CM | POA: Diagnosis not present

## 2022-09-16 LAB — VITAMIN D 25 HYDROXY (VIT D DEFICIENCY, FRACTURES): Vit D, 25-Hydroxy: 34.84 ng/mL (ref 30–100)

## 2022-09-16 NOTE — Progress Notes (Addendum)
SURVIVORSHIP VISIT:   BRIEF ONCOLOGIC HISTORY:  Oncology History  Malignant neoplasm of upper-outer quadrant of right breast in female, estrogen receptor positive (HCC)  03/12/2022 Initial Diagnosis   Screening mammogram detected right breast asymmetry at 12 o'clock position by ultrasound measured 1.2 cm, axilla negative, biopsy revealed grade 2 IDC ER 95% PR 90% HER2 equivalent FISH negative ratio 1.17, Ki-67 20%   03/24/2022 Cancer Staging   Staging form: Breast, AJCC 8th Edition - Clinical stage from 03/24/2022: Stage IA (cT1, cN0, cM0, G2, ER+, PR+, HER2-) - Signed by Serena Croissant, MD on 03/24/2022 Stage prefix: Initial diagnosis Histologic grading system: 3 grade system Laterality: Right Staged by: Pathologist and managing physician Stage used in treatment planning: Yes National guidelines used in treatment planning: Yes Type of national guideline used in treatment planning: NCCN   04/03/2022 Genetic Testing   Negative genetics--[facility] CustomNext-Cancer +RNAinsight Panel.  Report date is 04/03/2022.   The CustomNext-Cancer+RNAinsight panel offered by [facility] includes sequencing and rearrangement analysis for the following 47 genes:  APC, ATM, AXIN2, BARD1, BMPR1A, BRCA1, BRCA2, BRIP1, CDH1, CDK4, CDKN2A, CHEK2, DICER1, EPCAM, GREM1, HOXB13, MEN1, MLH1, MSH2, MSH3, MSH6, MUTYH, NBN, NF1, NF2, NTHL1, PALB2, PMS2, POLD1, POLE, PTEN, RAD51C, RAD51D, RECQL, RET, SDHA, SDHAF2, SDHB, SDHC, SDHD, SMAD4, SMARCA4, STK11, TP53, TSC1, TSC2, and VHL.  RNA data is routinely analyzed for use in variant interpretation for all genes.   04/19/2022 Surgery   Right lumpectomy: Grade 2 IDC 1.5 cm, margins negative, 0/4 lymph nodes negative, ER 95%, PR 90%, Ki-67 20%, HER2 negative   04/19/2022 Oncotype testing   8/3%   05/25/2022 - 06/22/2022 Radiation Therapy   Site Technique Total Dose (Gy) Dose per Fx (Gy) Completed Fx Beam Energies  Breast, Right: Breast_R 3D 40.05/40.05 2.67 15/15 6XFFF,  10XFFF  Breast, Right: Breast_R_Bst specialPort 12/12 2 6/6 12E, 15E     06/2022 -  Anti-estrogen oral therapy   Anastrozole x 5-7 years     INTERVAL HISTORY:  Ms. Moralis to review her survivorship care plan detailing her treatment course for breast cancer, as well as monitoring long-term side effects of that treatment, education regarding health maintenance, screening, and overall wellness and health promotion.     Overall, Ms. Shonka reports feeling quite well.  She is taking anastrozole daily with good tolerance.  She has some slight warmth and tenderness in her right breast but is applying cream to this daily.  She cannot recall when her last bone density testing was completed and whether it was with her OB/GYN or with Dr. Wylene Simmer.  She is taking a calcium/magnesium/zinc/vitamin D supplement daily.  She does have a vitamin D deficiency history and has not had this level rechecked recently.  REVIEW OF SYSTEMS:  Review of Systems  Constitutional:  Negative for appetite change, chills, fatigue, fever and unexpected weight change.  HENT:   Negative for hearing loss, lump/mass and trouble swallowing.   Eyes:  Negative for eye problems and icterus.  Respiratory:  Negative for chest tightness, cough and shortness of breath.   Cardiovascular:  Negative for chest pain, leg swelling and palpitations.  Gastrointestinal:  Negative for abdominal distention, abdominal pain, constipation, diarrhea, nausea and vomiting.  Endocrine: Negative for hot flashes.  Genitourinary:  Negative for difficulty urinating.   Musculoskeletal:  Negative for arthralgias.  Skin:  Negative for itching and rash.  Neurological:  Negative for dizziness, extremity weakness, headaches and numbness.  Hematological:  Negative for adenopathy. Does not bruise/bleed easily.  Psychiatric/Behavioral:  Negative for  depression. The patient is not nervous/anxious.    Breast: Denies any new nodularity, masses, tenderness,  nipple changes, or nipple discharge.       PAST MEDICAL/SURGICAL HISTORY:  Past Medical History:  Diagnosis Date   Anemia    Breast cancer (HCC)    Chronic kidney disease    uric acid kidney stones   Family history of breast cancer 03/24/2022   Family history of colon cancer 03/24/2022   Family history of prostate cancer 03/24/2022   GERD (gastroesophageal reflux disease)    past hx - uses ranitadine OTC prn only    Gout    History of radiation therapy    Right breast- 05/25/22-06/22/22- Dr. Antony Blackbird   Hyperlipidemia    Hypertension    Lumbar spine pain    Neuromuscular disorder (HCC)    hiatal hernia   Osteoarthritis    thumbs, big toe left foot, top both feet   PONV (postoperative nausea and vomiting)    Thyroid disease    Past Surgical History:  Procedure Laterality Date   ABLATION     BREAST BIOPSY  04/16/2022   [place] RT RADIOACTIVE SEED LOC 04/16/2022 [facility]AMMOGRAP[facility]   BREAST LUMPECTOMY WITH RADIOACTIVE SEED AND SENTINEL LYMPH NODE BIOPSY Right 04/19/2022   Procedure: RIGHT BREAST LUMPECTOMY WITH RADIOACTIVE SEED AND SENTINEL LYMPH NODE BIOPSY;  Surgeon: Chevis Pretty III, MD;  Location: [facility];  Service: General;  Laterality: Right;   COLONOSCOPY  2018   KN-MAC-suprep(exc)-tics   DILATION AND CURETTAGE OF UTERUS     no anesthesia during this procedure   EYE SURGERY Bilateral 12/2017   eyelid surgery    SHOULDER SURGERY Right    repair of tear      ALLERGIES:  Allergies  Allergen Reactions   Penicillins Hives and Rash   Radiaplexrx [Skin Protectants, Misc.] Rash   Sulfa Antibiotics Hives and Rash   Bactrim [Sulfamethoxazole-Trimethoprim] Hives and Rash   Codeine Nausea And Vomiting     CURRENT MEDICATIONS:  Outpatient Encounter Medications as of 09/16/2022  Medication Sig Note   acetaminophen (TYLENOL) 500 MG tablet Take 1,500 mg by mouth as needed. Alternates with Ibuprofen    allopurinol (ZYLOPRIM) 300 MG tablet Take 300 mg by  mouth daily.    anastrozole (ARIMIDEX) 1 MG tablet Take 1 tablet (1 mg total) by mouth daily.    atorvastatin (LIPITOR) 10 MG tablet Take 10 mg by mouth daily.    benazepril (LOTENSIN) 20 MG tablet Take 20 mg by mouth daily. Alternating days with Benazepril-HCTZ    benazepril-hydrochlorthiazide (LOTENSIN HCT) 20-12.5 MG tablet Take by mouth.    diclofenac sodium (VOLTAREN) 1 % GEL Apply 4 g topically 4 (four) times daily as needed (arthritis). 05/11/2016: Advised patient to use with PO diclofenac or topical diclofenac.  Avoid combined use.  Patient voiced understanding.     Ibuprofen 200 MG CAPS Take 400 mg by mouth as needed. Alternates with Tylenol    levothyroxine (SYNTHROID, LEVOTHROID) 75 MCG tablet Take 75 mcg by mouth daily before breakfast.    metoprolol succinate (TOPROL-XL) 50 MG 24 hr tablet Take 50 mg by mouth daily. Take with or immediately following a meal.    No facility-administered encounter medications on file as of 09/16/2022.     ONCOLOGIC FAMILY HISTORY:  Family History  Problem Relation Age of Onset   Rheum arthritis Mother    Tuberculosis Mother        "spinal tuberculosis"?   Heart attack Father  Breast cancer Maternal Aunt        dx 67s   Prostate cancer Maternal Uncle        mets; d. 22s   Rheum arthritis Daughter    Hypertension Daughter    Healthy Son    Colon cancer Cousin        maternal cousins x4; one dx before age 35; others dx after age 1   Colon polyps Neg Hx    Esophageal cancer Neg Hx    Rectal cancer Neg Hx    Stomach cancer Neg Hx     SOCIAL HISTORY:  Social History   Socioeconomic History   Marital status: Married    Spouse name: Acupuncturist   Number of children: 2   Years of education: Not on file   Highest education level: Master's degree (e.g., MA, MS, MEng, MEd, MSW, MBA)  Occupational History   Not on file  Tobacco Use   Smoking status: Never    Passive exposure: Past   Smokeless tobacco: Never  Vaping Use   Vaping Use: Never  used  Substance and Sexual Activity   Alcohol use: Never   Drug use: Never   Sexual activity: Not on file  Other Topics Concern   Not on file  Social History Narrative   Lives with husband   Right handed   Caffeine: occasionally tea or coke    Social Determinants of Health   Financial Resource Strain: Low Risk  (03/24/2022)   Overall Financial Resource Strain (CARDIA)    Difficulty of Paying Living Expenses: Not very hard  Food Insecurity: No Food Insecurity (03/24/2022)   Hunger Vital Sign    Worried About Running Out of Food in the Last Year: Never true    Ran Out of Food in the Last Year: Never true  Transportation Needs: No Transportation Needs (03/24/2022)   PRAPARE - Administrator, Civil Service (Medical): No    Lack of Transportation (Non-Medical): No  Physical Activity: Not on file  Stress: Not on file  Social Connections: Not on file  Intimate Partner Violence: Not on file     OBSERVATIONS/OBJECTIVE:  BP 113/68 (BP Location: Left Arm, Patient Position: Sitting)   Pulse (!) 53   Temp 98.1 F (36.7 C) (Temporal)   Resp 16   Wt 169 lb (76.7 kg)   SpO2 100%   BMI 29.01 kg/m  GENERAL: Patient is a well appearing female in no acute distress HEENT:  Sclerae anicteric.  Oropharynx clear and moist. No ulcerations or evidence of oropharyngeal candidiasis. Neck is supple.  NODES:  No cervical, supraclavicular, or axillary lymphadenopathy palpated.  BREAST EXAM: Right breast status postlumpectomy and radiation no sign of local recurrence left breast is benign. LUNGS:  Clear to auscultation bilaterally.  No wheezes or rhonchi. HEART:  Regular rate and rhythm. No murmur appreciated. ABDOMEN:  Soft, nontender.  Positive, normoactive bowel sounds. No organomegaly palpated. MSK:  No focal spinal tenderness to palpation. Full range of motion bilaterally in the upper extremities. EXTREMITIES:  No peripheral edema.   SKIN:  Clear with no obvious rashes or skin  changes. No nail dyscrasia. NEURO:  Nonfocal. Well oriented.  Appropriate affect.   LABORATORY DATA:  None for this visit.  DIAGNOSTIC IMAGING:  None for this visit.      ASSESSMENT AND PLAN:  Ms.. Kutter is a pleasant 67 y.o. female with Stage 1A right breast invasive ductal carcinoma, ER+/PR+/HER2-, diagnosed in December 2023, treated with lumpectomy, adjuvant radiation  therapy, and anti-estrogen therapy with anastrozole beginning in February 2024.  She presents to the Survivorship Clinic for our initial meeting and routine follow-up post-completion of treatment for breast cancer.    1. Stage 1A right breast cancer:  Ms. Talamantes is continuing to recover from definitive treatment for breast cancer. She will follow-up with her medical oncologist, Dr. Pamelia Hoit in 6 months with history and physical exam per surveillance protocol.  She will continue her anti-estrogen therapy with anastrozole. Thus far, she is tolerating the anastrozole well, with minimal side effects. She was instructed to make Dr. Pamelia Hoit or myself aware if she begins to experience any worsening side effects of the medication and I could see her back in clinic to help manage those side effects, as needed.   Her mammogram is due October 2024; orders placed today. Today, a comprehensive survivorship care plan and treatment summary was reviewed with the patient today detailing her breast cancer diagnosis, treatment course, potential late/long-term effects of treatment, appropriate follow-up care with recommendations for the future, and patient education resources.  A copy of this summary, along with a letter will be sent to the patient's primary care provider via mail/fax/In Basket message after today's visit.    2.  Vitamin D deficiency: We will recheck this level today.  Continue her current supplementation that is included in her calcium supplement.  3. Bone health:  Given Ms. Kilgallon's age/history of breast cancer and her  current treatment regimen including anti-estrogen therapy with anastrozole, she is at risk for bone demineralization.  I asked my nurse to reach out to both Dr. Wylene Simmer and Dr. Senaida Ores to ascertain when and where her most recent bone density testing was completed.  We will let her know once we have the results with the date and time in front of [place].  She was given education on specific activities to promote bone health.  4. Cancer screening:  Due to Ms. Steinruck's history and her age, she should receive screening for skin cancers, colon cancer, and gynecologic cancers.  The information and recommendations are listed on the patient's comprehensive care plan/treatment summary and were reviewed in detail with the patient.    5. Health maintenance and wellness promotion: Ms. Helf was encouraged to consume 5-7 servings of fruits and vegetables per day. We reviewed the "Nutrition Rainbow" handout.  She was also encouraged to engage in moderate to vigorous exercise for 30 minutes per day most days of the week.  She was instructed to limit her alcohol consumption and continue to abstain from tobacco use.     6. Support services/counseling: It is not uncommon for this period of the patient's cancer care trajectory to be one of many emotions and stressors.  She was given information regarding our available services and encouraged to contact me with any questions or for help enrolling in any of our support group/programs.    Follow up instructions:    -Return to cancer center in 6 months for follow-up with Dr. Pamelia Hoit -Mammogram due in October 2024\ -Bone density testing due in April 2025 with Dr. Wylene Simmer (4/23 DEXA shows osteopenia at -1.2) -She is welcome to return back to the Survivorship Clinic at any time; no additional follow-up needed at this time.  -Consider referral back to survivorship as a long-term survivor for continued surveillance  The patient was provided an opportunity to ask  questions and all were answered. The patient agreed with the plan and demonstrated an understanding of the instructions.   Total encounter time:45  minutes*in face-to-face visit time, chart review, lab review, care coordination, order entry, and documentation of the encounter time.    Lillard Anes, NP 09/16/22 10:48 AM Medical Oncology and Hematology [facility] [address] [place], [place] [address] Tel. [phone]    Fax. [phone]  *Total Encounter Time as defined by the Centers for Medicare and Medicaid Services includes, in addition to the face-to-face time of a patient visit (documented in the note above) non-face-to-face time: obtaining and reviewing outside history, ordering and reviewing medications, tests or procedures, care coordination (communications with other health care professionals or caregivers) and documentation in the medical record.

## 2022-09-16 NOTE — Telephone Encounter (Signed)
Scheduled appointment per 5/9 los. Patient is aware of the made appointment.

## 2022-09-20 ENCOUNTER — Encounter: Payer: Self-pay | Admitting: Hematology and Oncology

## 2022-09-22 ENCOUNTER — Telehealth: Payer: Self-pay | Admitting: *Deleted

## 2022-09-22 NOTE — Telephone Encounter (Signed)
-----   Message from Loa Socks, NP sent at 09/20/2022  8:51 AM EDT ----- Not sure if I have sent something about her Vitamin d or not.  She could take additional vitamin d3 1000 IU daily ----- Message ----- From: Interface, Lab In [place] Sent: 09/16/2022   5:27 PM EDT To: Loa Socks, NP

## 2022-09-22 NOTE — Telephone Encounter (Signed)
Per Lindsey,NP, attempted to call pt several times. Vmail was left with message below. Advised to office for any concerns

## 2022-11-09 DIAGNOSIS — Z170 Estrogen receptor positive status [ER+]: Secondary | ICD-10-CM | POA: Diagnosis not present

## 2022-11-09 DIAGNOSIS — C50211 Malignant neoplasm of upper-inner quadrant of right female breast: Secondary | ICD-10-CM | POA: Diagnosis not present

## 2022-11-15 ENCOUNTER — Ambulatory Visit: Payer: Medicare PPO | Attending: General Surgery

## 2022-11-15 VITALS — Wt 170.1 lb

## 2022-11-15 DIAGNOSIS — Z483 Aftercare following surgery for neoplasm: Secondary | ICD-10-CM | POA: Insufficient documentation

## 2022-11-15 NOTE — Therapy (Signed)
OUTPATIENT PHYSICAL THERAPY SOZO SCREENING NOTE   Patient Name: Jo Garcia MRN: [id number] DOB:[birth year], 67 y.o., female Today's Date: 11/15/2022  PCP: Gaspar Garbe, MD REFERRING PROVIDER: Griselda Miner, MD   PT End of Session - 11/15/22 0830     Visit Number 2   # unchanged due to screen only   PT Start Time 0828    PT Stop Time 0832    PT Time Calculation (min) 4 min    Activity Tolerance Patient tolerated treatment well    Behavior During Therapy [facility] for tasks assessed/performed             Past Medical History:  Diagnosis Date   Anemia    Breast cancer (HCC)    Chronic kidney disease    uric acid kidney stones   Family history of breast cancer 03/24/2022   Family history of colon cancer 03/24/2022   Family history of prostate cancer 03/24/2022   GERD (gastroesophageal reflux disease)    past hx - uses ranitadine OTC prn only    Gout    History of radiation therapy    Right breast- 05/25/22-06/22/22- Dr. Antony Blackbird   Hyperlipidemia    Hypertension    Lumbar spine pain    Neuromuscular disorder (HCC)    hiatal hernia   Osteoarthritis    thumbs, big toe left foot, top both feet   PONV (postoperative nausea and vomiting)    Thyroid disease    Past Surgical History:  Procedure Laterality Date   ABLATION     BREAST BIOPSY  04/16/2022   [place] RT RADIOACTIVE SEED LOC 04/16/2022 [facility]AMMO[facility]   BREAST LUMPECTOMY WITH RADIOACTIVE SEED AND SENTINEL LYMPH NODE BIOPSY Right 04/19/2022   Procedure: RIGHT BREAST LUMPECTOMY WITH RADIOACTIVE SEED AND SENTINEL LYMPH NODE BIOPSY;  Surgeon: Griselda Miner, MD;  Location: [facility];  Service: General;  Laterality: Right;   COLONOSCOPY  2018   KN-MAC-suprep(exc)-tics   DILATION AND CURETTAGE OF UTERUS     no anesthesia during this procedure   EYE SURGERY Bilateral 12/2017   eyelid surgery    SHOULDER SURGERY Right    repair of tear    Patient Active Problem List   Diagnosis Date Noted    Genetic testing 04/06/2022   Family history of breast cancer 03/24/2022   Family history of prostate cancer 03/24/2022   Family history of colon cancer 03/24/2022   Malignant neoplasm of upper-outer quadrant of right breast in female, estrogen receptor positive (HCC) 03/22/2022   Medial epicondylitis of both elbows 01/18/2020   Dyslipidemia 11/04/2016   Osteoarthritis of foot 04/30/2016   History of hypertension 04/30/2016   History of renal calculi 04/30/2016   Vitamin D deficiency 04/30/2016   History of hypothyroidism 04/30/2016   Primary osteoarthritis of both hands 04/30/2016   Primary osteoarthritis of both knees 04/30/2016   Idiopathic chronic gout of multiple sites without tophus 04/30/2016    REFERRING DIAG: right breast cancer at risk for lymphedema  THERAPY DIAG:  Aftercare following surgery for neoplasm  PERTINENT HISTORY: Patient was diagnosed on 02/22/2022 with right grade 2 invasive ductal carcinoma breast cancer. It measures 1.2 cm and is located in the upper inner quadrant. It is ER/PR positive and HER2 negative with a Ki67 of 20%. She had a Rt lumpectomy and SLNB on 04/19/22 with 4 negative nodes removed.  Will be having radiation   PRECAUTIONS: right UE Lymphedema risk, None  SUBJECTIVE: Pt returns for her 3 month L-Dex  screen.   PAIN:  Are you having pain? No  SOZO SCREENING: Patient was assessed today using the SOZO machine to determine the lymphedema index score. This was compared to her baseline score. It was determined that she is within the recommended range when compared to her baseline and no further action is needed at this time. She will continue SOZO screenings. These are done every 3 months for 2 years post operatively followed by every 6 months for 2 years, and then annually.   L-DEX FLOWSHEETS - 11/15/22 0800       L-DEX LYMPHEDEMA SCREENING   Measurement Type Unilateral    L-DEX MEASUREMENT EXTREMITY Upper Extremity    POSITION  Standing     DOMINANT SIDE Right    At Risk Side Right    BASELINE SCORE (UNILATERAL) -0.7    L-DEX SCORE (UNILATERAL) 0    VALUE CHANGE (UNILAT) 0.7               Hermenia Bers, PTA 11/15/2022, 8:31 AM

## 2022-12-07 DIAGNOSIS — L821 Other seborrheic keratosis: Secondary | ICD-10-CM | POA: Diagnosis not present

## 2022-12-07 DIAGNOSIS — L570 Actinic keratosis: Secondary | ICD-10-CM | POA: Diagnosis not present

## 2022-12-07 DIAGNOSIS — D692 Other nonthrombocytopenic purpura: Secondary | ICD-10-CM | POA: Diagnosis not present

## 2022-12-07 DIAGNOSIS — D235 Other benign neoplasm of skin of trunk: Secondary | ICD-10-CM | POA: Diagnosis not present

## 2022-12-07 DIAGNOSIS — D1801 Hemangioma of skin and subcutaneous tissue: Secondary | ICD-10-CM | POA: Diagnosis not present

## 2022-12-07 DIAGNOSIS — L819 Disorder of pigmentation, unspecified: Secondary | ICD-10-CM | POA: Diagnosis not present

## 2023-01-02 DIAGNOSIS — U071 COVID-19: Secondary | ICD-10-CM

## 2023-01-02 HISTORY — DX: COVID-19: U07.1

## 2023-01-11 NOTE — Progress Notes (Signed)
Office Visit Note  Patient: Jo Garcia             Date of Birth: [birth year]           MRN: [id number]             PCP: Gaspar Garbe, MD Referring: Gaspar Garbe, MD Visit Date: 01/20/2023 Occupation: @GUAROCC @  Subjective:  Pain in multiple joints  History of Present Illness: Jo Garcia is a 66 y.o. female with history of osteoarthritis, gout and degenerative disc disease.  She has not had any gout flares.  She has been taking allopurinol 300 mg p.o. daily on a regular basis.  Patient states that she developed COVID-19 virus infection on January 02, 2023.  She was treated with Paxlovid.  She also had COVID-19 vaccine on January 15, 2023.  She has no side effects from the vaccine.  She had no complications from the COVID-19 virus infection.  She continues to have pain and stiffness in her bilateral hands, knee joints and her feet.  She has not noticed any joint swelling.  The lower back pain is manageable.  She did not get the DEXA scan yet.    Activities of Daily Living:  Patient reports morning stiffness for several hours.   Patient Denies nocturnal pain.  Difficulty dressing/grooming: Denies Difficulty climbing stairs: Denies Difficulty getting out of chair: Denies Difficulty using hands for taps, buttons, cutlery, and/or writing: Reports  Review of Systems  Constitutional:  Positive for fatigue.  HENT:  Negative for mouth sores and mouth dryness.   Eyes:  Negative for dryness.  Respiratory:  Negative for shortness of breath.   Cardiovascular:  Negative for chest pain and palpitations.  Gastrointestinal:  Negative for blood in stool, constipation and diarrhea.  Endocrine: Negative for increased urination.  Genitourinary:  Negative for involuntary urination.  Musculoskeletal:  Positive for joint pain, joint pain and morning stiffness. Negative for gait problem, joint swelling, myalgias, muscle weakness, muscle tenderness and myalgias.  Skin:  Negative  for color change, rash, hair loss and sensitivity to sunlight.  Allergic/Immunologic: Negative for susceptible to infections.  Neurological:  Negative for dizziness and headaches.  Hematological:  Negative for swollen glands.  Psychiatric/Behavioral:  Negative for depressed mood and sleep disturbance. The patient is not nervous/anxious.     PMFS History:  Patient Active Problem List   Diagnosis Date Noted   Genetic testing 04/06/2022   Family history of breast cancer 03/24/2022   Family history of prostate cancer 03/24/2022   Family history of colon cancer 03/24/2022   Malignant neoplasm of upper-outer quadrant of right breast in female, estrogen receptor positive (HCC) 03/22/2022   Medial epicondylitis of both elbows 01/18/2020   Dyslipidemia 11/04/2016   Osteoarthritis of foot 04/30/2016   History of hypertension 04/30/2016   History of renal calculi 04/30/2016   Vitamin D deficiency 04/30/2016   History of hypothyroidism 04/30/2016   Primary osteoarthritis of both hands 04/30/2016   Primary osteoarthritis of both knees 04/30/2016   Idiopathic chronic gout of multiple sites without tophus 04/30/2016    Past Medical History:  Diagnosis Date   Anemia    Breast cancer (HCC)    Chronic kidney disease    uric acid kidney stones   COVID-19 01/02/2023   Family history of breast cancer 03/24/2022   Family history of colon cancer 03/24/2022   Family history of prostate cancer 03/24/2022   GERD (gastroesophageal reflux disease)    past hx - uses ranitadine  OTC prn only    Gout    History of radiation therapy    Right breast- 05/25/22-06/22/22- Dr. Antony Blackbird   Hyperlipidemia    Hypertension    Lumbar spine pain    Neuromuscular disorder (HCC)    hiatal hernia   Osteoarthritis    thumbs, big toe left foot, top both feet   PONV (postoperative nausea and vomiting)    Thyroid disease     Family History  Problem Relation Age of Onset   Rheum arthritis Mother    Tuberculosis  Mother        "spinal tuberculosis"?   Heart attack Father    Breast cancer Maternal Aunt        dx 69s   Prostate cancer Maternal Uncle        mets; d. 62s   Rheum arthritis Daughter    Hypertension Daughter    Healthy Son    Colon cancer Cousin        maternal cousins x4; one dx before age 42; others dx after age 20   Colon polyps Neg Hx    Esophageal cancer Neg Hx    Rectal cancer Neg Hx    Stomach cancer Neg Hx    Past Surgical History:  Procedure Laterality Date   ABLATION     BREAST BIOPSY  04/16/2022   [place] RT RADIOACTIVE SEED LOC 04/16/2022 [facility]MOGR[facility]   BREAST LUMPECTOMY WITH RADIOACTIVE SEED AND SENTINEL LYMPH NODE BIOPSY Right 04/19/2022   Procedure: RIGHT BREAST LUMPECTOMY WITH RADIOACTIVE SEED AND SENTINEL LYMPH NODE BIOPSY;  Surgeon: Griselda Miner, MD;  Location: [facility];  Service: General;  Laterality: Right;   COLONOSCOPY  2018   KN-MAC-suprep(exc)-tics   DILATION AND CURETTAGE OF UTERUS     no anesthesia during this procedure   EYE SURGERY Bilateral 12/2017   eyelid surgery    SHOULDER SURGERY Right    repair of tear    Social History   Social History Narrative   Lives with husband   Right handed   Caffeine: occasionally tea or coke    Immunization History  Administered Date(s) Administered   PFIZER(Purple Top)SARS-COV-2 Vaccination 07/25/2019, 08/15/2019, 02/11/2020, 01/15/2023     Objective: Vital Signs: BP 115/78 (BP Location: Left Arm, Patient Position: Sitting, Cuff Size: Normal)   Pulse (!) 53   Resp 16   Ht 5\' 4"  (1.626 m)   Wt 166 lb 12.8 oz (75.7 kg)   BMI 28.63 kg/m    Physical Exam Vitals and nursing note reviewed.  Constitutional:      Appearance: She is well-developed.  HENT:     Head: Normocephalic and atraumatic.  Eyes:     Conjunctiva/sclera: Conjunctivae normal.  Cardiovascular:     Rate and Rhythm: Normal rate and regular rhythm.     Heart sounds: Normal heart sounds.  Pulmonary:     Effort:  Pulmonary effort is normal.     Breath sounds: Normal breath sounds.  Abdominal:     General: Bowel sounds are normal.     Palpations: Abdomen is soft.  Musculoskeletal:     Cervical back: Normal range of motion.  Lymphadenopathy:     Cervical: No cervical adenopathy.  Skin:    General: Skin is warm and dry.     Capillary Refill: Capillary refill takes less than 2 seconds.  Neurological:     Mental Status: She is alert and oriented to person, place, and time.  Psychiatric:  Behavior: Behavior normal.      Musculoskeletal Exam: She had good range of motion of the cervical spine.  She had good range of motion of her lumbar spine without tenderness.  Shoulder joints, elbow joints, wrist joints in good range of motion.  She had bilateral CMC PIP and DIP thickening.  Hip joints and knee joints in good range of motion without any warmth swelling or effusion.  She had bilateral hammertoes.  There was no tenderness over ankles or MTPs.  CDAI Exam: CDAI Score: -- Patient Global: --; Provider Global: -- Swollen: --; Tender: -- Joint Exam 01/20/2023   No joint exam has been documented for this visit   There is currently no information documented on the homunculus. Go to the Rheumatology activity and complete the homunculus joint exam.  Investigation: No additional findings.  Imaging: No results found.  Recent Labs: Lab Results  Component Value Date   WBC 6.6 03/24/2022   HGB 14.2 03/24/2022   PLT 158 03/24/2022   NA 139 04/13/2022   K 3.7 04/13/2022   CL 102 04/13/2022   CO2 29 04/13/2022   GLUCOSE 92 04/13/2022   BUN 20 04/13/2022   CREATININE 1.21 (H) 04/13/2022   BILITOT 0.7 03/24/2022   ALKPHOS 69 03/24/2022   AST 23 03/24/2022   ALT 22 03/24/2022   PROT 7.4 03/24/2022   ALBUMIN 4.1 03/24/2022   CALCIUM 9.2 04/13/2022   GFRAA 52 (L) 07/18/2020    Speciality Comments: No specialty comments available.  Procedures:  No procedures performed Allergies:  Penicillins; Radiaplexrx [skin protectants, misc.]; Sulfa antibiotics; Bactrim [sulfamethoxazole-trimethoprim]; and Codeine   Assessment / Plan:     Visit Diagnoses: Idiopathic chronic gout of multiple sites without tophus -patient denies having any gout flares.  She has been on allopurinol 300 mg p.o. daily by Dr. Wylene Simmer and has been getting labs monitored by them.  I do not have any recent labs available.  Uric acid: 4.0 on 01/19/2022.  Medication monitoring encounter - Allopurinol 300 mg p.o. daily.  Primary osteoarthritis of both hands-she continues to have pain and stiffness in her bilateral hands.  Bilateral PIP and DIP thickening was noted.  Joint protection muscle strengthening was discussed.  Primary osteoarthritis of both knees-she is to have some discomfort in her knee joints.  No warmth swelling or effusion was noted.  X-rays in the past showed moderate chondromalacia patella.  Lower extremity muscle strength exercises were discussed.  Primary osteoarthritis of both feet-she has discomfort in her feet.  She has bilateral hammertoes.  She has been using metatarsal pads.  Proper fitting shoes were advised.  DDD (degenerative disc disease), lumbar - L4-L5 spondylolisthesis, mild to moderate spinal stenosis at L4-5 and facet joint arthropathy.  Patient denies having any recent episodes of lower back pain.  She states she had discomfort in her lower back after prolonged driving at 1 time.  Other medical problems are listed as follows:  Malignant neoplasm of upper-outer quadrant of right breast in female, estrogen receptor positive (HCC) - diagnosed 03/2022, s/p lumpectomy, LN resection, RTX, she is on Anasrrozole.  Osteoporosis screening - pending.  Patient will schedule through her PCP.  History of vitamin D deficiency  History of hypertension  History of renal calculi  Dyslipidemia  History of hypothyroidism  COVID-19 virus infection - 01/02/23 treated with Paxlovid.  She also  had COVID-19 vaccine in September 7th.  Orders: No orders of the defined types were placed in this encounter.  No orders of the defined  types were placed in this encounter.   Follow-Up Instructions: Return in about 6 months (around 07/20/2023) for Osteoarthritis, Gout.   Pollyann Savoy, MD  Note - This record has been created using Animal nutritionist.  Chart creation errors have been sought, but may not always  have been located. Such creation errors do not reflect on  the standard of medical care.

## 2023-01-13 DIAGNOSIS — H2513 Age-related nuclear cataract, bilateral: Secondary | ICD-10-CM | POA: Diagnosis not present

## 2023-01-13 DIAGNOSIS — H5213 Myopia, bilateral: Secondary | ICD-10-CM | POA: Diagnosis not present

## 2023-01-20 ENCOUNTER — Encounter: Payer: Self-pay | Admitting: Rheumatology

## 2023-01-20 ENCOUNTER — Ambulatory Visit: Payer: Medicare PPO | Attending: Rheumatology | Admitting: Rheumatology

## 2023-01-20 VITALS — BP 115/78 | HR 53 | Resp 16 | Ht 64.0 in | Wt 166.8 lb

## 2023-01-20 DIAGNOSIS — Z13820 Encounter for screening for osteoporosis: Secondary | ICD-10-CM | POA: Diagnosis not present

## 2023-01-20 DIAGNOSIS — M5136 Other intervertebral disc degeneration, lumbar region: Secondary | ICD-10-CM | POA: Diagnosis not present

## 2023-01-20 DIAGNOSIS — M170 Bilateral primary osteoarthritis of knee: Secondary | ICD-10-CM | POA: Diagnosis not present

## 2023-01-20 DIAGNOSIS — Z8639 Personal history of other endocrine, nutritional and metabolic disease: Secondary | ICD-10-CM | POA: Diagnosis not present

## 2023-01-20 DIAGNOSIS — M1A09X0 Idiopathic chronic gout, multiple sites, without tophus (tophi): Secondary | ICD-10-CM

## 2023-01-20 DIAGNOSIS — Z5181 Encounter for therapeutic drug level monitoring: Secondary | ICD-10-CM | POA: Diagnosis not present

## 2023-01-20 DIAGNOSIS — M19041 Primary osteoarthritis, right hand: Secondary | ICD-10-CM | POA: Diagnosis not present

## 2023-01-20 DIAGNOSIS — Z8679 Personal history of other diseases of the circulatory system: Secondary | ICD-10-CM

## 2023-01-20 DIAGNOSIS — E785 Hyperlipidemia, unspecified: Secondary | ICD-10-CM

## 2023-01-20 DIAGNOSIS — Z170 Estrogen receptor positive status [ER+]: Secondary | ICD-10-CM

## 2023-01-20 DIAGNOSIS — U071 COVID-19: Secondary | ICD-10-CM

## 2023-01-20 DIAGNOSIS — C50411 Malignant neoplasm of upper-outer quadrant of right female breast: Secondary | ICD-10-CM | POA: Diagnosis not present

## 2023-01-20 DIAGNOSIS — M19071 Primary osteoarthritis, right ankle and foot: Secondary | ICD-10-CM | POA: Diagnosis not present

## 2023-01-20 DIAGNOSIS — Z87442 Personal history of urinary calculi: Secondary | ICD-10-CM

## 2023-01-20 DIAGNOSIS — M19042 Primary osteoarthritis, left hand: Secondary | ICD-10-CM

## 2023-01-20 DIAGNOSIS — M19072 Primary osteoarthritis, left ankle and foot: Secondary | ICD-10-CM

## 2023-02-12 DIAGNOSIS — Z23 Encounter for immunization: Secondary | ICD-10-CM | POA: Diagnosis not present

## 2023-02-21 ENCOUNTER — Ambulatory Visit: Payer: Medicare PPO | Attending: General Surgery

## 2023-02-21 VITALS — Wt 167.0 lb

## 2023-02-21 DIAGNOSIS — Z483 Aftercare following surgery for neoplasm: Secondary | ICD-10-CM | POA: Insufficient documentation

## 2023-02-21 NOTE — Therapy (Signed)
OUTPATIENT PHYSICAL THERAPY SOZO SCREENING NOTE   Patient Name: Jo Garcia MRN: [id number] DOB:[birth year], 67 y.o., female Today's Date: 02/21/2023  PCP: Gaspar Garbe, MD REFERRING PROVIDER: Griselda Miner, MD   PT End of Session - 02/21/23 [phone]     Visit Number 2   # unchanged due to screen only   PT Start Time 0838    PT Stop Time 0842    PT Time Calculation (min) 4 min    Activity Tolerance Patient tolerated treatment well    Behavior During Therapy [facility] for tasks assessed/performed             Past Medical History:  Diagnosis Date   Anemia    Breast cancer (HCC)    Chronic kidney disease    uric acid kidney stones   COVID-19 01/02/2023   Family history of breast cancer 03/24/2022   Family history of colon cancer 03/24/2022   Family history of prostate cancer 03/24/2022   GERD (gastroesophageal reflux disease)    past hx - uses ranitadine OTC prn only    Gout    History of radiation therapy    Right breast- 05/25/22-06/22/22- Dr. Antony Blackbird   Hyperlipidemia    Hypertension    Lumbar spine pain    Neuromuscular disorder (HCC)    hiatal hernia   Osteoarthritis    thumbs, big toe left foot, top both feet   PONV (postoperative nausea and vomiting)    Thyroid disease    Past Surgical History:  Procedure Laterality Date   ABLATION     BREAST BIOPSY  04/16/2022   [place] RT RADIOACTIVE SEED LOC 04/16/2022 [facility]AMMOGRAP[facility]   BREAST LUMPECTOMY WITH RADIOACTIVE SEED AND SENTINEL LYMPH NODE BIOPSY Right 04/19/2022   Procedure: RIGHT BREAST LUMPECTOMY WITH RADIOACTIVE SEED AND SENTINEL LYMPH NODE BIOPSY;  Surgeon: Griselda Miner, MD;  Location: [facility];  Service: General;  Laterality: Right;   COLONOSCOPY  2018   KN-MAC-suprep(exc)-tics   DILATION AND CURETTAGE OF UTERUS     no anesthesia during this procedure   EYE SURGERY Bilateral 12/2017   eyelid surgery    SHOULDER SURGERY Right    repair of tear    Patient Active Problem List    Diagnosis Date Noted   Genetic testing 04/06/2022   Family history of breast cancer 03/24/2022   Family history of prostate cancer 03/24/2022   Family history of colon cancer 03/24/2022   Malignant neoplasm of upper-outer quadrant of right breast in female, estrogen receptor positive (HCC) 03/22/2022   Medial epicondylitis of both elbows 01/18/2020   Dyslipidemia 11/04/2016   Osteoarthritis of foot 04/30/2016   History of hypertension 04/30/2016   History of renal calculi 04/30/2016   Vitamin D deficiency 04/30/2016   History of hypothyroidism 04/30/2016   Primary osteoarthritis of both hands 04/30/2016   Primary osteoarthritis of both knees 04/30/2016   Idiopathic chronic gout of multiple sites without tophus 04/30/2016    REFERRING DIAG: right breast cancer at risk for lymphedema  THERAPY DIAG:  Aftercare following surgery for neoplasm  PERTINENT HISTORY: Patient was diagnosed on 02/22/2022 with right grade 2 invasive ductal carcinoma breast cancer. It measures 1.2 cm and is located in the upper inner quadrant. It is ER/PR positive and HER2 negative with a Ki67 of 20%. She had a Rt lumpectomy and SLNB on 04/19/22 with 4 negative nodes removed.  Will be having radiation   PRECAUTIONS: right UE Lymphedema risk, None  SUBJECTIVE: Pt returns for  her 3 month L-Dex screen.   PAIN:  Are you having pain? No  SOZO SCREENING: Patient was assessed today using the SOZO machine to determine the lymphedema index score. This was compared to her baseline score. It was determined that she is within the recommended range when compared to her baseline and no further action is needed at this time. She will continue SOZO screenings. These are done every 3 months for 2 years post operatively followed by every 6 months for 2 years, and then annually.   L-DEX FLOWSHEETS - 02/21/23 0800       L-DEX LYMPHEDEMA SCREENING   Measurement Type Unilateral    L-DEX MEASUREMENT EXTREMITY Upper Extremity     POSITION  Standing    DOMINANT SIDE Right    At Risk Side Right    BASELINE SCORE (UNILATERAL) -0.7    L-DEX SCORE (UNILATERAL) 1.6    VALUE CHANGE (UNILAT) 2.3               Hermenia Bers, PTA 02/21/2023, 8:43 AM

## 2023-02-24 ENCOUNTER — Ambulatory Visit
Admission: RE | Admit: 2023-02-24 | Discharge: 2023-02-24 | Disposition: A | Discharge status: Home / Self Care | Payer: Medicare PPO | Source: Ambulatory Visit | Attending: Adult Health | Admitting: Adult Health

## 2023-02-24 DIAGNOSIS — Z01419 Encounter for gynecological examination (general) (routine) without abnormal findings: Secondary | ICD-10-CM | POA: Diagnosis not present

## 2023-02-24 DIAGNOSIS — Z170 Estrogen receptor positive status [ER+]: Secondary | ICD-10-CM | POA: Diagnosis not present

## 2023-02-24 DIAGNOSIS — C50411 Malignant neoplasm of upper-outer quadrant of right female breast: Secondary | ICD-10-CM | POA: Diagnosis not present

## 2023-02-24 DIAGNOSIS — Z853 Personal history of malignant neoplasm of breast: Secondary | ICD-10-CM | POA: Diagnosis not present

## 2023-02-24 DIAGNOSIS — C50011 Malignant neoplasm of nipple and areola, right female breast: Secondary | ICD-10-CM | POA: Diagnosis not present

## 2023-02-24 HISTORY — DX: Personal history of irradiation: Z92.3

## 2023-02-25 ENCOUNTER — Encounter: Payer: Self-pay | Admitting: Rheumatology

## 2023-03-21 ENCOUNTER — Inpatient Hospital Stay: Payer: Medicare PPO | Attending: Hematology and Oncology | Admitting: Hematology and Oncology

## 2023-03-21 VITALS — BP 115/58 | HR 54 | Temp 97.8°F | Resp 18 | Ht 64.0 in | Wt 165.0 lb

## 2023-03-21 DIAGNOSIS — C50411 Malignant neoplasm of upper-outer quadrant of right female breast: Secondary | ICD-10-CM | POA: Diagnosis not present

## 2023-03-21 DIAGNOSIS — Z170 Estrogen receptor positive status [ER+]: Secondary | ICD-10-CM | POA: Diagnosis not present

## 2023-03-21 DIAGNOSIS — Z79811 Long term (current) use of aromatase inhibitors: Secondary | ICD-10-CM | POA: Insufficient documentation

## 2023-03-21 DIAGNOSIS — M8588 Other specified disorders of bone density and structure, other site: Secondary | ICD-10-CM | POA: Insufficient documentation

## 2023-03-21 DIAGNOSIS — Z923 Personal history of irradiation: Secondary | ICD-10-CM | POA: Diagnosis not present

## 2023-03-21 NOTE — Assessment & Plan Note (Addendum)
04/19/2022:Right lumpectomy: Grade 2 IDC 1.5 cm, margins negative, 0/4 lymph nodes negative, ER 95%, PR 90%, Ki-67 20%, HER2 negative    Treatment plan: Oncotype DX score 8 (ROR 3%) Adjuvant radiation therapy 05/26/2022-06/22/2022 Adjuvant antiestrogen therapy with anastrozole started February 2024 Day after her surgery.  Patient's husband had a cerebellar stroke.  He is doing extremely well ------------------------------------------------------------------------------------------------------------------------------- Anastrozole toxicities: Tolerating anastrozole extremely well.  Does not have any hot flashes.  She does have joint stiffness but it has been even prior to starting anastrozole therapy.  Lifestyle: Patient does Pilates and walks every day.  She also takes care of her yard work as well.  Breast cancer surveillance: Breast exam: 03/21/2023: Benign Mammogram 02/24/2023: Benign breast density category C Bone density April 2024: T-score -2: Osteopenia: Takes calcium and vitamin D.  Return to clinic in 1 year for follow-up

## 2023-03-21 NOTE — Progress Notes (Signed)
Patient Care Team: [name], Adelfa Koh, MD as PCP - General (Internal Medicine) Serena Croissant, MD as Consulting Physician (Hematology and Oncology) Antony Blackbird, MD as Consulting Physician (Radiation Oncology) Griselda Miner, MD as Consulting Physician (General Surgery) Huel Cote, MD as Consulting Physician (Obstetrics and Gynecology)  DIAGNOSIS:  Encounter Diagnosis  Name Primary?   Malignant neoplasm of upper-outer quadrant of right breast in female, estrogen receptor positive (HCC) Yes    SUMMARY OF ONCOLOGIC HISTORY: Oncology History  Malignant neoplasm of upper-outer quadrant of right breast in female, estrogen receptor positive (HCC)  03/12/2022 Initial Diagnosis   Screening mammogram detected right breast asymmetry at 12 o'clock position by ultrasound measured 1.2 cm, axilla negative, biopsy revealed grade 2 IDC ER 95% PR 90% HER2 equivalent FISH negative ratio 1.17, Ki-67 20%   03/24/2022 Cancer Staging   Staging form: Breast, AJCC 8th Edition - Clinical stage from 03/24/2022: Stage IA (cT1, cN0, cM0, G2, ER+, PR+, HER2-) - Signed by Serena Croissant, MD on 03/24/2022 Stage prefix: Initial diagnosis Histologic grading system: 3 grade system Laterality: Right Staged by: Pathologist and managing physician Stage used in treatment planning: Yes National guidelines used in treatment planning: Yes Type of national guideline used in treatment planning: NCCN   04/03/2022 Genetic Testing   Negative genetics--[facility] CustomNext-Cancer +RNAinsight Panel.  Report date is 04/03/2022.   The CustomNext-Cancer+RNAinsight panel offered by [facility] includes sequencing and rearrangement analysis for the following 47 genes:  APC, ATM, AXIN2, BARD1, BMPR1A, BRCA1, BRCA2, BRIP1, CDH1, CDK4, CDKN2A, CHEK2, DICER1, EPCAM, GREM1, HOXB13, MEN1, MLH1, MSH2, MSH3, MSH6, MUTYH, NBN, NF1, NF2, NTHL1, PALB2, PMS2, POLD1, POLE, PTEN, RAD51C, RAD51D, RECQL, RET, SDHA, SDHAF2, SDHB, SDHC, SDHD,  SMAD4, SMARCA4, STK11, TP53, TSC1, TSC2, and VHL.  RNA data is routinely analyzed for use in variant interpretation for all genes.   04/19/2022 Surgery   Right lumpectomy: Grade 2 IDC 1.5 cm, margins negative, 0/4 lymph nodes negative, ER 95%, PR 90%, Ki-67 20%, HER2 negative   04/19/2022 Oncotype testing   8/3%   05/25/2022 - 06/22/2022 Radiation Therapy   Site Technique Total Dose (Gy) Dose per Fx (Gy) Completed Fx Beam Energies  Breast, Right: Breast_R 3D 40.05/40.05 2.67 15/15 6XFFF, 10XFFF  Breast, Right: Breast_R_Bst specialPort 12/12 2 6/6 12E, 15E     06/2022 -  Anti-estrogen oral therapy   Anastrozole x 5-7 years     CHIEF COMPLIANT: Follow-up on anastrozole therapy  HISTORY OF PRESENT ILLNESS:   History of Present Illness   Jo Garcia, a patient with a history of breast cancer, has been on hormone therapy for approximately nine months. She reports no noticeable difference since starting the therapy and continues to take it daily without fail. She describes her energy levels as variable, depending on the day and her physical activities. She denies experiencing hot flashes.  She experiences joint stiffness, particularly in her hands, knees, and the tops of her feet. The stiffness is most noticeable in the morning but tends to ease up with movement. She sometimes uses hot water to alleviate the stiffness. She has been experiencing these symptoms even before starting hormone therapy and does not report any worsening. To support her joint health, she takes a collagen supplement.  She also has a history of osteopenia, particularly in her spine. She takes a calcium supplement that includes vitamin D3, magnesium, and zinc. She is physically active, engaging in regular walking, Pilates, and yard work. She reports occasional pain in her lower back and knee, which she manages  with heat and ice.         ALLERGIES:  is allergic to penicillins; radiaplexrx [skin protectants, misc.]; sulfa  antibiotics; bactrim [sulfamethoxazole-trimethoprim]; and codeine.  MEDICATIONS:  Current Outpatient Medications  Medication Sig Dispense Refill   allopurinol (ZYLOPRIM) 300 MG tablet Take 300 mg by mouth daily.     anastrozole (ARIMIDEX) 1 MG tablet Take 1 tablet (1 mg total) by mouth daily. 90 tablet 3   atorvastatin (LIPITOR) 10 MG tablet Take 10 mg by mouth daily.     benazepril (LOTENSIN) 20 MG tablet Take 20 mg by mouth daily. Alternating days with Benazepril-HCTZ     benazepril-hydrochlorthiazide (LOTENSIN HCT) 20-12.5 MG tablet Take by mouth.     diclofenac sodium (VOLTAREN) 1 % GEL Apply 4 g topically 4 (four) times daily as needed (arthritis).     levothyroxine (SYNTHROID, LEVOTHROID) 75 MCG tablet Take 75 mcg by mouth daily before breakfast.     metoprolol succinate (TOPROL-XL) 50 MG 24 hr tablet Take 50 mg by mouth daily. Take with or immediately following a meal.     No current facility-administered medications for this visit.    PHYSICAL EXAMINATION: ECOG PERFORMANCE STATUS: 1 - Symptomatic but completely ambulatory  Vitals:   03/21/23 0914  BP: (!) 115/58  Pulse: (!) 54  Resp: 18  Temp: 97.8 F (36.6 C)  SpO2: 100%   Filed Weights   03/21/23 0914  Weight: 165 lb (74.8 kg)      LABORATORY DATA:  I have reviewed the data as listed    Latest Ref Rng & Units 04/13/2022    3:00 PM 03/24/2022    8:16 AM 01/19/2022   11:41 AM  CMP  Glucose 70 - 99 mg/dL 92  409  91   BUN 8 - 23 mg/dL 20  25  18    Creatinine 0.44 - 1.00 mg/dL 8.11  9.14  7.82   Sodium 135 - 145 mmol/L 139  142  143   Potassium 3.5 - 5.1 mmol/L 3.7  4.2  4.6   Chloride 98 - 111 mmol/L 102  109  106   CO2 22 - 32 mmol/L 29  29  31    Calcium 8.9 - 10.3 mg/dL 9.2  9.2  9.8   Total Protein 6.5 - 8.1 g/dL  7.4  7.0   Total Bilirubin 0.3 - 1.2 mg/dL  0.7  0.5   Alkaline Phos 38 - 126 U/L  69    AST 15 - 41 U/L  23  21   ALT 0 - 44 U/L  22  16     Lab Results  Component Value Date   WBC 6.6  03/24/2022   HGB 14.2 03/24/2022   HCT 42.7 03/24/2022   MCV 91.6 03/24/2022   PLT 158 03/24/2022   NEUTROABS 4.2 03/24/2022    ASSESSMENT & PLAN:  Malignant neoplasm of upper-outer quadrant of right breast in female, estrogen receptor positive (HCC) 04/19/2022:Right lumpectomy: Grade 2 IDC 1.5 cm, margins negative, 0/4 lymph nodes negative, ER 95%, PR 90%, Ki-67 20%, HER2 negative    Treatment plan: Oncotype DX score 8 (ROR 3%) Adjuvant radiation therapy 05/26/2022-06/22/2022 Adjuvant antiestrogen therapy with anastrozole started February 2024 Day after her surgery.  Patient's husband had a cerebellar stroke.  He is doing extremely well ------------------------------------------------------------------------------------------------------------------------------- Anastrozole toxicities: Tolerating anastrozole extremely well.  Does not have any hot flashes.  She does have joint stiffness but it has been even prior to starting anastrozole therapy.  Lifestyle: Patient does Pilates  and walks every day.  She also takes care of her yard work as well.  Breast cancer surveillance: Mammogram 02/24/2023: Benign breast density category C Bone density April 2024: T-score -2: Osteopenia: Takes calcium and vitamin D.  General Health Maintenance -Continue with regular physical activity, including Pilates, walking, and yard work. -Consider utilizing insurance-covered gym memberships if available. -Next appointment in one year.          No orders of the defined types were placed in this encounter.  The patient has a good understanding of the overall plan. she agrees with it. she will call with any problems that may develop before the next visit here. Total time spent: 30 mins including face to face time and time spent for planning, charting and co-ordination of care   Tamsen Meek, MD 03/21/23

## 2023-05-10 DIAGNOSIS — C50211 Malignant neoplasm of upper-inner quadrant of right female breast: Secondary | ICD-10-CM | POA: Diagnosis not present

## 2023-05-10 DIAGNOSIS — Z170 Estrogen receptor positive status [ER+]: Secondary | ICD-10-CM | POA: Diagnosis not present

## 2023-05-23 ENCOUNTER — Ambulatory Visit: Payer: Medicare PPO | Attending: General Surgery

## 2023-05-23 VITALS — Wt 166.0 lb

## 2023-05-23 DIAGNOSIS — Z483 Aftercare following surgery for neoplasm: Secondary | ICD-10-CM | POA: Insufficient documentation

## 2023-05-23 NOTE — Therapy (Signed)
 OUTPATIENT PHYSICAL THERAPY SOZO SCREENING NOTE   Patient Name: Jo Garcia MRN: [id number] DOB:06/19/1955, 68 y.o., female Today's Date: 05/23/2023  PCP: Vernadine Charlie ORN, MD REFERRING PROVIDER: Curvin Deward MOULD, MD   PT End of Session - 05/23/23 [phone]     Visit Number 2   # unchanged due to screen only   PT Start Time 0839    PT Stop Time 0844    PT Time Calculation (min) 5 min    Activity Tolerance Patient tolerated treatment well    Behavior During Therapy [facility] for tasks assessed/performed             Past Medical History:  Diagnosis Date   Anemia    Breast cancer (HCC)    Chronic kidney disease    uric acid kidney stones   COVID-19 01/02/2023   Family history of breast cancer 03/24/2022   Family history of colon cancer 03/24/2022   Family history of prostate cancer 03/24/2022   GERD (gastroesophageal reflux disease)    past hx - uses ranitadine OTC prn only    Gout    History of radiation therapy    Right breast- 05/25/22-06/22/22- Dr. Lynwood Nasuti   Hyperlipidemia    Hypertension    Lumbar spine pain    Neuromuscular disorder (HCC)    hiatal hernia   Osteoarthritis    thumbs, big toe left foot, top both feet   Personal history of radiation therapy    PONV (postoperative nausea and vomiting)    Thyroid  disease    Past Surgical History:  Procedure Laterality Date   ABLATION     BREAST BIOPSY  04/16/2022   US  RT RADIOACTIVE SEED LOC 04/16/2022 [facility]   BREAST LUMPECTOMY Right 04/2022   BREAST LUMPECTOMY WITH RADIOACTIVE SEED AND SENTINEL LYMPH NODE BIOPSY Right 04/19/2022   Procedure: RIGHT BREAST LUMPECTOMY WITH RADIOACTIVE SEED AND SENTINEL LYMPH NODE BIOPSY;  Surgeon: Curvin Deward MOULD, MD;  Location: [facility];  Service: General;  Laterality: Right;   COLONOSCOPY  2018   KN-MAC-suprep(exc)-tics   DILATION AND CURETTAGE OF UTERUS     no anesthesia during this procedure   EYE SURGERY Bilateral 12/2017   eyelid surgery     SHOULDER SURGERY Right    repair of tear    Patient Active Problem List   Diagnosis Date Noted   Genetic testing 04/06/2022   Family history of breast cancer 03/24/2022   Family history of prostate cancer 03/24/2022   Family history of colon cancer 03/24/2022   Malignant neoplasm of upper-outer quadrant of right breast in female, estrogen receptor positive (HCC) 03/22/2022   Medial epicondylitis of both elbows 01/18/2020   Dyslipidemia 11/04/2016   Osteoarthritis of foot 04/30/2016   History of hypertension 04/30/2016   History of renal calculi 04/30/2016   Vitamin D  deficiency 04/30/2016   History of hypothyroidism 04/30/2016   Primary osteoarthritis of both hands 04/30/2016   Primary osteoarthritis of both knees 04/30/2016   Idiopathic chronic gout of multiple sites without tophus 04/30/2016    REFERRING DIAG: right breast cancer at risk for lymphedema  THERAPY DIAG:  Aftercare following surgery for neoplasm  PERTINENT HISTORY: Patient was diagnosed on 02/22/2022 with right grade 2 invasive ductal carcinoma breast cancer. It measures 1.2 cm and is located in the upper inner quadrant. It is ER/PR positive and HER2 negative with a Ki67 of 20%. She had a Rt lumpectomy and SLNB on 04/19/22 with 4 negative nodes removed.  Will be having  radiation   PRECAUTIONS: right UE Lymphedema risk, None  SUBJECTIVE: Pt returns for her 3 month L-Dex screen.   PAIN:  Are you having pain? No  SOZO SCREENING: Patient was assessed today using the SOZO machine to determine the lymphedema index score. This was compared to her baseline score. It was determined that she is within the recommended range when compared to her baseline and no further action is needed at this time. She will continue SOZO screenings. These are done every 3 months for 2 years post operatively followed by every 6 months for 2 years, and then annually.   L-DEX FLOWSHEETS - 05/23/23 0800       L-DEX LYMPHEDEMA SCREENING    Measurement Type Unilateral    L-DEX MEASUREMENT EXTREMITY Upper Extremity    POSITION  Standing    DOMINANT SIDE Right    At Risk Side Right    BASELINE SCORE (UNILATERAL) -0.7    L-DEX SCORE (UNILATERAL) -2.5    VALUE CHANGE (UNILAT) -1.8               Aden Berwyn Caldron, PTA 05/23/2023, 8:43 AM

## 2023-06-07 ENCOUNTER — Other Ambulatory Visit: Payer: Self-pay | Admitting: Hematology and Oncology

## 2023-08-22 ENCOUNTER — Ambulatory Visit: Payer: Medicare PPO | Attending: General Surgery

## 2023-08-22 VITALS — Wt 163.5 lb

## 2023-08-22 DIAGNOSIS — Z483 Aftercare following surgery for neoplasm: Secondary | ICD-10-CM | POA: Insufficient documentation

## 2023-08-22 NOTE — Therapy (Signed)
 OUTPATIENT PHYSICAL THERAPY [facility] SCREENING NOTE   Patient Name: Jo Garcia MRN: [id number] DOB:Jan 19, 1956, 68 y.o., female Today's Date: 08/22/2023  PCP: Gaspar Garbe, MD REFERRING PROVIDER: Griselda Miner, MD   PT End of Session - 08/22/23 [phone]     Visit Number 2   # unchanged due to screen only   PT Start Time 0900    PT Stop Time 0906    PT Time Calculation (min) 6 min    Activity Tolerance Patient tolerated treatment well    Behavior During Therapy [facility] for tasks assessed/performed             Past Medical History:  Diagnosis Date   Anemia    Breast cancer (HCC)    Chronic kidney disease    uric acid kidney stones   COVID-19 01/02/2023   Family history of breast cancer 03/24/2022   Family history of colon cancer 03/24/2022   Family history of prostate cancer 03/24/2022   GERD (gastroesophageal reflux disease)    past hx - uses ranitadine OTC prn only    Gout    History of radiation therapy    Right breast- 05/25/22-06/22/22- Dr. Antony Blackbird   Hyperlipidemia    Hypertension    Lumbar spine pain    Neuromuscular disorder (HCC)    hiatal hernia   Osteoarthritis    thumbs, big toe left foot, top both feet   Personal history of radiation therapy    PONV (postoperative nausea and vomiting)    Thyroid disease    Past Surgical History:  Procedure Laterality Date   ABLATION     BREAST BIOPSY  04/16/2022   [place] RT RADIOACTIVE SEED LOC 04/16/2022 [facility]   BREAST LUMPECTOMY Right 04/2022   BREAST LUMPECTOMY WITH RADIOACTIVE SEED AND SENTINEL LYMPH NODE BIOPSY Right 04/19/2022   Procedure: RIGHT BREAST LUMPECTOMY WITH RADIOACTIVE SEED AND SENTINEL LYMPH NODE BIOPSY;  Surgeon: Griselda Miner, MD;  Location: [facility];  Service: General;  Laterality: Right;   COLONOSCOPY  2018   KN-MAC-suprep(exc)-tics   DILATION AND CURETTAGE OF UTERUS     no anesthesia during this procedure   EYE SURGERY Bilateral 12/2017   eyelid surgery     SHOULDER SURGERY Right    repair of tear    Patient Active Problem List   Diagnosis Date Noted   Genetic testing 04/06/2022   Family history of breast cancer 03/24/2022   Family history of prostate cancer 03/24/2022   Family history of colon cancer 03/24/2022   Malignant neoplasm of upper-outer quadrant of right breast in female, estrogen receptor positive (HCC) 03/22/2022   Medial epicondylitis of both elbows 01/18/2020   Dyslipidemia 11/04/2016   Osteoarthritis of foot 04/30/2016   History of hypertension 04/30/2016   History of renal calculi 04/30/2016   Vitamin D deficiency 04/30/2016   History of hypothyroidism 04/30/2016   Primary osteoarthritis of both hands 04/30/2016   Primary osteoarthritis of both knees 04/30/2016   Idiopathic chronic gout of multiple sites without tophus 04/30/2016    REFERRING DIAG: right breast cancer at risk for lymphedema  THERAPY DIAG:  Aftercare following surgery for neoplasm  PERTINENT HISTORY: Patient was diagnosed on 02/22/2022 with right grade 2 invasive ductal carcinoma breast cancer. It measures 1.2 cm and is located in the upper inner quadrant. It is ER/PR positive and HER2 negative with a Ki67 of 20%. She had a Rt lumpectomy and SLNB on 04/19/22 with 4 negative nodes removed.  Will be having  radiation   PRECAUTIONS: right UE Lymphedema risk, None  SUBJECTIVE: Pt returns for her 3 month L-Dex screen.   PAIN:  Are you having pain? No  SOZO SCREENING: Patient was assessed today using the SOZO machine to determine the lymphedema index score. This was compared to her baseline score. It was determined that she is within the recommended range when compared to her baseline and no further action is needed at this time. She will continue SOZO screenings. These are done every 3 months for 2 years post operatively followed by every 6 months for 2 years, and then annually.   L-DEX FLOWSHEETS - 08/22/23 0900       L-DEX LYMPHEDEMA SCREENING    Measurement Type Unilateral    L-DEX MEASUREMENT EXTREMITY Upper Extremity    POSITION  Standing    DOMINANT SIDE Right    At Risk Side Right    BASELINE SCORE (UNILATERAL) -0.7    L-DEX SCORE (UNILATERAL) -0.7    VALUE CHANGE (UNILAT) 0               Denyce Flank, PTA 08/22/2023, 9:08 AM

## 2023-09-05 NOTE — Progress Notes (Signed)
 Office Visit Note  Patient: Jo Garcia             Date of Birth: [birth year]           MRN: [id number]             PCP: Suzzanne Estrin, MD Referring: Suzzanne Estrin, MD Visit Date: 09/19/2023 Occupation: @GUAROCC @  Subjective:  Patient reports pain in left thumb, right 3rd digit trigger finger and tenderness on the top of both feet when an extra blanket covers them at nighttime.    History of Present Illness: Jo Garcia is a 68 y.o. female with osteoarthritis, gout and degenerative disc disease.  He returns today after her last visit in September 2024.  She states she has been doing gardening recently and has been experiencing discomfort in her bilateral thumb.  She is also noticing some triggering of the right middle finger.  She has discomfort in her back off and on.  She also has been experiencing discomfort on the top of her both feet when she covers her feet with the blanket at night.  None of the other joints are painful or swollen.  She has not had a gout flare.  She has been taking allopurinol 300 mg daily without any interruption.  She states she developed upper respiratory tract infection about a month ago and is still gradually recovering from it.  She had a cough which is gradually improving.    Activities of Daily Living:  Patient reports morning stiffness for a few minutes.   Patient Reports nocturnal pain.  Difficulty dressing/grooming: Reports Difficulty climbing stairs: Denies Difficulty getting out of chair: Denies Difficulty using hands for taps, buttons, cutlery, and/or writing: Reports  Review of Systems  Constitutional:  Positive for fatigue.  HENT:  Negative for mouth sores and mouth dryness.   Eyes:  Negative for dryness.  Respiratory:  Negative for shortness of breath.   Cardiovascular:  Negative for chest pain and palpitations.  Gastrointestinal:  Negative for blood in stool, constipation and diarrhea.  Endocrine: Negative for  increased urination.  Genitourinary:  Negative for involuntary urination.  Musculoskeletal:  Positive for joint pain, joint pain, joint swelling and morning stiffness. Negative for gait problem, myalgias, muscle weakness, muscle tenderness and myalgias.  Skin:  Positive for sensitivity to sunlight. Negative for color change, rash and hair loss.  Allergic/Immunologic: Negative for susceptible to infections.  Neurological:  Negative for dizziness, numbness and headaches.  Hematological:  Negative for swollen glands.  Psychiatric/Behavioral:  Negative for depressed mood and sleep disturbance. The patient is not nervous/anxious.     PMFS History:  Patient Active Problem List   Diagnosis Date Noted   Genetic testing 04/06/2022   Family history of breast cancer 03/24/2022   Family history of prostate cancer 03/24/2022   Family history of colon cancer 03/24/2022   Malignant neoplasm of upper-outer quadrant of right breast in female, estrogen receptor positive (HCC) 03/22/2022   Medial epicondylitis of both elbows 01/18/2020   Dyslipidemia 11/04/2016   Osteoarthritis of foot 04/30/2016   History of hypertension 04/30/2016   History of renal calculi 04/30/2016   Vitamin D  deficiency 04/30/2016   History of hypothyroidism 04/30/2016   Primary osteoarthritis of both hands 04/30/2016   Primary osteoarthritis of both knees 04/30/2016   Idiopathic chronic gout of multiple sites without tophus 04/30/2016    Past Medical History:  Diagnosis Date   Anemia    Breast cancer (HCC)    Chronic kidney  disease    uric acid kidney stones   COVID-19 01/02/2023   Family history of breast cancer 03/24/2022   Family history of colon cancer 03/24/2022   Family history of prostate cancer 03/24/2022   GERD (gastroesophageal reflux disease)    past hx - uses ranitadine OTC prn only    Gout    History of radiation therapy    Right breast- 05/25/22-06/22/22- Dr. Retta Caster   Hyperlipidemia     Hypertension    Lumbar spine pain    Neuromuscular disorder (HCC)    hiatal hernia   Osteoarthritis    thumbs, big toe left foot, top both feet   Personal history of radiation therapy    PONV (postoperative nausea and vomiting)    Thyroid disease     Family History  Problem Relation Age of Onset   Rheum arthritis Mother    Tuberculosis Mother        "spinal tuberculosis"?   Heart attack Father    Breast cancer Maternal Aunt        dx 62s   Prostate cancer Maternal Uncle        mets; d. 33s   Rheum arthritis Daughter    Hypertension Daughter    Healthy Son    Colon cancer Cousin        maternal cousins x4; one dx before age 23; others dx after age 63   Colon polyps Neg Hx    Esophageal cancer Neg Hx    Rectal cancer Neg Hx    Stomach cancer Neg Hx    Past Surgical History:  Procedure Laterality Date   ABLATION     BREAST BIOPSY  04/16/2022   US  RT RADIOACTIVE SEED LOC 04/16/2022 [facility]   BREAST LUMPECTOMY Right 04/2022   BREAST LUMPECTOMY WITH RADIOACTIVE SEED AND SENTINEL LYMPH NODE BIOPSY Right 04/19/2022   Procedure: RIGHT BREAST LUMPECTOMY WITH RADIOACTIVE SEED AND SENTINEL LYMPH NODE BIOPSY;  Surgeon: Caralyn Chandler, MD;  Location: [facility];  Service: General;  Laterality: Right;   COLONOSCOPY  2018   KN-MAC-suprep(exc)-tics   DILATION AND CURETTAGE OF UTERUS     no anesthesia during this procedure   EYE SURGERY Bilateral 12/2017   eyelid surgery    SHOULDER SURGERY Right    repair of tear    Social History   Social History Narrative   Lives with husband   Right handed   Caffeine: occasionally tea or coke    Immunization History  Administered Date(s) Administered   PFIZER(Purple Top)SARS-COV-2 Vaccination 07/25/2019, 08/15/2019, 02/11/2020, 01/15/2023     Objective: Vital Signs: BP 116/75 (BP Location: Left Arm, Patient Position: Sitting, Cuff Size: Normal)   Pulse (!) 52   Resp 16   Ht 5\' 4"  (1.626 m)   Wt 164 lb 12.8 oz  (74.8 kg)   BMI 28.29 kg/m    Physical Exam Vitals and nursing note reviewed.  Constitutional:      Appearance: She is well-developed.  HENT:     Head: Normocephalic and atraumatic.  Eyes:     Conjunctiva/sclera: Conjunctivae normal.  Cardiovascular:     Rate and Rhythm: Normal rate and regular rhythm.     Heart sounds: Normal heart sounds.  Pulmonary:     Effort: Pulmonary effort is normal.     Breath sounds: Normal breath sounds.  Abdominal:     General: Bowel sounds are normal.     Palpations: Abdomen is soft.  Musculoskeletal:     Cervical  back: Normal range of motion.  Lymphadenopathy:     Cervical: No cervical adenopathy.  Skin:    General: Skin is warm and dry.     Capillary Refill: Capillary refill takes less than 2 seconds.  Neurological:     Mental Status: She is alert and oriented to person, place, and time.  Psychiatric:        Behavior: Behavior normal.      Musculoskeletal Exam: Cervical spine was in good range of motion.  She had mild thoracic kyphosis.  She had no discomfort with range of motion of the lumbar spine.  She has some tenderness over right gluteal region.  Shoulders, elbows, wrist joints, MCPs PIPs and DIPs with good range of motion.  She had bilateral CMP PIP and DIP thickening.  She had thickening of the right third flexor tendon without triggering.  Hip joints with good range of motion.  Knee joints in good range of motion without any warmth swelling or effusion.  She had bilateral dorsal spurs.  There was no MTP PIP or DIP thickening.  CDAI Exam: CDAI Score: -- Patient Global: --; Provider Global: -- Swollen: --; Tender: -- Joint Exam 09/19/2023   No joint exam has been documented for this visit   There is currently no information documented on the homunculus. Go to the Rheumatology activity and complete the homunculus joint exam.  Investigation: No additional findings.  Imaging: No results found.  Recent Labs: Lab Results   Component Value Date   WBC 6.6 03/24/2022   HGB 14.2 03/24/2022   PLT 158 03/24/2022   NA 139 04/13/2022   K 3.7 04/13/2022   CL 102 04/13/2022   CO2 29 04/13/2022   GLUCOSE 92 04/13/2022   BUN 20 04/13/2022   CREATININE 1.21 (H) 04/13/2022   BILITOT 0.7 03/24/2022   ALKPHOS 69 03/24/2022   AST 23 03/24/2022   ALT 22 03/24/2022   PROT 7.4 03/24/2022   ALBUMIN 4.1 03/24/2022   CALCIUM 9.2 04/13/2022   GFRAA 52 (L) 07/18/2020    Speciality Comments: No specialty comments available.  Procedures:  No procedures performed Allergies: Penicillins; Radiaplexrx [skin protectants, misc.]; Sulfa antibiotics; Bactrim [sulfamethoxazole-trimethoprim]; and Codeine   Assessment / Plan:     Visit Diagnoses: Idiopathic chronic gout of multiple sites without tophus -patient denies having a gout flare.  She has been taking allopurinol 300 mg p.o. daily by Dr. [name] and has been getting labs monitored by them.  Patient states her labs were drawn last week.  She will have the labs forwarded to us .  Primary osteoarthritis of both hands-she complains of ongoing pain and discomfort in her hands especially over the [facility] joints.  Joint protection muscle strengthening was discussed.  A handout on hand exercises was provided.  Trigger finger, right middle finger -she has intermittent triggering.  Use of topical Voltaren  gel was discussed.  I also advised to have a cortisone injection in the future if her symptoms are more persistent.  Primary osteoarthritis of both knees -she has intermittent discomfort in her right knee joint.  She has good range of motion without any warmth swelling or effusion.  X-rays in the past showed moderate chondromalacia patella.  Primary osteoarthritis of both feet-she had dorsal spurs.  Proper fitting shoes with arch support were discussed.  No synovitis was noted.  Spondylosis of lumbar spine - L4-L5 spondylolisthesis, mild to moderate spinal stenosis at L4-5 and facet joint  arthropathy.  She has intermittent discomfort in her lower back.  Patient states she has been participating in jury duty.  She states prolonged sitting causes exacerbation of her lower back discomfort and left gluteal pain which she describes in the piriformis region.  A handout on back exercises was given.  She has been also going to Pilates classes.  Malignant neoplasm of upper-outer quadrant of right breast in female, estrogen receptor positive (HCC) - diagnosed 03/2022, s/p lumpectomy, LN resection, RTX, she is on Anasrrozole.  Osteoporosis screening-DEXA scan was in April 2023.  Previous DEXA scan showed mild osteopenia.  She is planning to have repeat DEXA scan through Dr. Antionette Kirks office.  History of hypertension-blood pressure was normal today at 116/75.  Other medical problems are listed as follows:  Dyslipidemia  History of vitamin D  deficiency  History of renal calculi  History of hypothyroidism  Orders: No orders of the defined types were placed in this encounter.  No orders of the defined types were placed in this encounter.    Follow-Up Instructions: Return in about 6 months (around 03/21/2024) for Osteoarthritis, Gout.   Nicholas Bari, MD  Note - This record has been created using Animal nutritionist.  Chart creation errors have been sought, but may not always  have been located. Such creation errors do not reflect on  the standard of medical care.

## 2023-09-14 DIAGNOSIS — M8580 Other specified disorders of bone density and structure, unspecified site: Secondary | ICD-10-CM | POA: Diagnosis not present

## 2023-09-14 DIAGNOSIS — I10 Essential (primary) hypertension: Secondary | ICD-10-CM | POA: Diagnosis not present

## 2023-09-14 DIAGNOSIS — E7800 Pure hypercholesterolemia, unspecified: Secondary | ICD-10-CM | POA: Diagnosis not present

## 2023-09-14 DIAGNOSIS — M109 Gout, unspecified: Secondary | ICD-10-CM | POA: Diagnosis not present

## 2023-09-14 DIAGNOSIS — Z1212 Encounter for screening for malignant neoplasm of rectum: Secondary | ICD-10-CM | POA: Diagnosis not present

## 2023-09-14 DIAGNOSIS — R739 Hyperglycemia, unspecified: Secondary | ICD-10-CM | POA: Diagnosis not present

## 2023-09-14 DIAGNOSIS — E039 Hypothyroidism, unspecified: Secondary | ICD-10-CM | POA: Diagnosis not present

## 2023-09-19 ENCOUNTER — Ambulatory Visit: Payer: Medicare PPO | Attending: Rheumatology | Admitting: Rheumatology

## 2023-09-19 ENCOUNTER — Telehealth: Payer: Self-pay | Admitting: Rheumatology

## 2023-09-19 ENCOUNTER — Encounter: Payer: Self-pay | Admitting: Rheumatology

## 2023-09-19 VITALS — BP 116/75 | HR 52 | Resp 16 | Ht 64.0 in | Wt 164.8 lb

## 2023-09-19 DIAGNOSIS — M1A09X0 Idiopathic chronic gout, multiple sites, without tophus (tophi): Secondary | ICD-10-CM | POA: Diagnosis not present

## 2023-09-19 DIAGNOSIS — E785 Hyperlipidemia, unspecified: Secondary | ICD-10-CM

## 2023-09-19 DIAGNOSIS — M47816 Spondylosis without myelopathy or radiculopathy, lumbar region: Secondary | ICD-10-CM | POA: Diagnosis not present

## 2023-09-19 DIAGNOSIS — M65331 Trigger finger, right middle finger: Secondary | ICD-10-CM

## 2023-09-19 DIAGNOSIS — M19072 Primary osteoarthritis, left ankle and foot: Secondary | ICD-10-CM

## 2023-09-19 DIAGNOSIS — Z8679 Personal history of other diseases of the circulatory system: Secondary | ICD-10-CM | POA: Diagnosis not present

## 2023-09-19 DIAGNOSIS — Z13820 Encounter for screening for osteoporosis: Secondary | ICD-10-CM | POA: Diagnosis not present

## 2023-09-19 DIAGNOSIS — C50411 Malignant neoplasm of upper-outer quadrant of right female breast: Secondary | ICD-10-CM

## 2023-09-19 DIAGNOSIS — M19042 Primary osteoarthritis, left hand: Secondary | ICD-10-CM

## 2023-09-19 DIAGNOSIS — Z5181 Encounter for therapeutic drug level monitoring: Secondary | ICD-10-CM

## 2023-09-19 DIAGNOSIS — M19041 Primary osteoarthritis, right hand: Secondary | ICD-10-CM | POA: Diagnosis not present

## 2023-09-19 DIAGNOSIS — U071 COVID-19: Secondary | ICD-10-CM

## 2023-09-19 DIAGNOSIS — M170 Bilateral primary osteoarthritis of knee: Secondary | ICD-10-CM | POA: Diagnosis not present

## 2023-09-19 DIAGNOSIS — M19071 Primary osteoarthritis, right ankle and foot: Secondary | ICD-10-CM

## 2023-09-19 DIAGNOSIS — Z170 Estrogen receptor positive status [ER+]: Secondary | ICD-10-CM

## 2023-09-19 DIAGNOSIS — Z8639 Personal history of other endocrine, nutritional and metabolic disease: Secondary | ICD-10-CM

## 2023-09-19 DIAGNOSIS — Z87442 Personal history of urinary calculi: Secondary | ICD-10-CM

## 2023-09-19 NOTE — Telephone Encounter (Signed)
 Pt stated she forgot to ask a question for Dr. Alvira Josephs about some of her medications. Pt is requesting a call back at [phone]

## 2023-09-19 NOTE — Telephone Encounter (Signed)
 Reached out to the patient. She states she is on Anastrozole . She states you mentioned there was another medication that might not be as "irritating to her hands". She would like to know your opinion if she should stay on this medication or talk to Dr. Gudena about changing the medication when she sees him in November. Please advise.

## 2023-09-19 NOTE — Patient Instructions (Addendum)
 Please get repeat DEXA scan at Dr. Antionette Kirks office.  Please get following up through your PCP: CBC with differential, CMP with GFR, uric acid and forward results to us .  Low Back Sprain or Strain Rehab Ask your health care provider which exercises are safe for you. Do exercises exactly as told by your health care provider and adjust them as directed. It is normal to feel mild stretching, pulling, tightness, or discomfort as you do these exercises. Stop right away if you feel sudden pain or your pain gets worse. Do not begin these exercises until told by your health care provider. Stretching and range-of-motion exercises These exercises warm up your muscles and joints and improve the movement and flexibility of your back. These exercises also help to relieve pain, numbness, and tingling. Lumbar rotation  Lie on your back on a firm bed or the floor with your knees bent. Straighten your arms out to your sides so each arm forms a 90-degree angle (right angle) with a side of your body. Slowly move (rotate) both of your knees to one side of your body until you feel a stretch in your lower back (lumbar). Try not to let your shoulders lift off the floor. Hold this position for __________ seconds. Tense your abdominal muscles and slowly move your knees back to the starting position. Repeat this exercise on the other side of your body. Repeat __________ times. Complete this exercise __________ times a day. Single knee to chest  Lie on your back on a firm bed or the floor with both legs straight. Bend one of your knees. Use your hands to move your knee up toward your chest until you feel a gentle stretch in your lower back and buttock. Hold your leg in this position by holding on to the front of your knee. Keep your other leg as straight as possible. Hold this position for __________ seconds. Slowly return to the starting position. Repeat with your other leg. Repeat __________ times. Complete this  exercise __________ times a day. Prone extension on elbows  Lie on your abdomen on a firm bed or the floor (prone position). Prop yourself up on your elbows. Use your arms to help lift your chest up until you feel a gentle stretch in your abdomen and your lower back. This will place some of your body weight on your elbows. If this is uncomfortable, try stacking pillows under your chest. Your hips should stay down, against the surface that you are lying on. Keep your hip and back muscles relaxed. Hold this position for __________ seconds. Slowly relax your upper body and return to the starting position. Repeat __________ times. Complete this exercise __________ times a day. Strengthening exercises These exercises build strength and endurance in your back. Endurance is the ability to use your muscles for a long time, even after they get tired. Pelvic tilt This exercise strengthens the muscles that lie deep in the abdomen. Lie on your back on a firm bed or the floor with your legs extended. Bend your knees so they are pointing toward the ceiling and your feet are flat on the floor. Tighten your lower abdominal muscles to press your lower back against the floor. This motion will tilt your pelvis so your tailbone points up toward the ceiling instead of pointing to your feet or the floor. To help with this exercise, you may place a small towel under your lower back and try to push your back into the towel. Hold this position for __________  seconds. Let your muscles relax completely before you repeat this exercise. Repeat __________ times. Complete this exercise __________ times a day. Alternating arm and leg raises  Get on your hands and knees on a firm surface. If you are on a hard floor, you may want to use padding, such as an exercise mat, to cushion your knees. Line up your arms and legs. Your hands should be directly below your shoulders, and your knees should be directly below your  hips. Lift your left leg behind you. At the same time, raise your right arm and straighten it in front of you. Do not lift your leg higher than your hip. Do not lift your arm higher than your shoulder. Keep your abdominal and back muscles tight. Keep your hips facing the ground. Do not arch your back. Keep your balance carefully, and do not hold your breath. Hold this position for __________ seconds. Slowly return to the starting position. Repeat with your right leg and your left arm. Repeat __________ times. Complete this exercise __________ times a day. Abdominal set with straight leg raise  Lie on your back on a firm bed or the floor. Bend one of your knees and keep your other leg straight. Tense your abdominal muscles and lift your straight leg up, 4-6 inches (10-15 cm) off the ground. Keep your abdominal muscles tight and hold this position for __________ seconds. Do not hold your breath. Do not arch your back. Keep it flat against the ground. Keep your abdominal muscles tense as you slowly lower your leg back to the starting position. Repeat with your other leg. Repeat __________ times. Complete this exercise __________ times a day. Single leg lower with bent knees Lie on your back on a firm bed or the floor. Tense your abdominal muscles and lift your feet off the floor, one foot at a time, so your knees and hips are bent in 90-degree angles (right angles). Your knees should be over your hips and your lower legs should be parallel to the floor. Keeping your abdominal muscles tense and your knee bent, slowly lower one of your legs so your toe touches the ground. Lift your leg back up to return to the starting position. Do not hold your breath. Do not let your back arch. Keep your back flat against the ground. Repeat with your other leg. Repeat __________ times. Complete this exercise __________ times a day. Posture and body mechanics Good posture and healthy body mechanics can  help to relieve stress in your body's tissues and joints. Body mechanics refers to the movements and positions of your body while you do your daily activities. Posture is part of body mechanics. Good posture means: Your spine is in its natural S-curve position (neutral). Your shoulders are pulled back slightly. Your head is not tipped forward (neutral). Follow these guidelines to improve your posture and body mechanics in your everyday activities. Standing  When standing, keep your spine neutral and your feet about hip-width apart. Keep a slight bend in your knees. Your ears, shoulders, and hips should line up. When you do a task in which you stand in one place for a long time, place one foot up on a stable object that is 2-4 inches (5-10 cm) high, such as a footstool. This helps keep your spine neutral. Sitting  When sitting, keep your spine neutral and keep your feet flat on the floor. Use a footrest, if necessary, and keep your thighs parallel to the floor. Avoid rounding your shoulders,  and avoid tilting your head forward. When working at a desk or a computer, keep your desk at a height where your hands are slightly lower than your elbows. Slide your chair under your desk so you are close enough to maintain good posture. When working at a computer, place your monitor at a height where you are looking straight ahead and you do not have to tilt your head forward or downward to look at the screen. Resting When lying down and resting, avoid positions that are most painful for you. If you have pain with activities such as sitting, bending, stooping, or squatting, lie in a position in which your body does not bend very much. For example, avoid curling up on your side with your arms and knees near your chest (fetal position). If you have pain with activities such as standing for a long time or reaching with your arms, lie with your spine in a neutral position and bend your knees slightly. Try the  following positions: Lying on your side with a pillow between your knees. Lying on your back with a pillow under your knees. Lifting  When lifting objects, keep your feet at least shoulder-width apart and tighten your abdominal muscles. Bend your knees and hips and keep your spine neutral. It is important to lift using the strength of your legs, not your back. Do not lock your knees straight out. Always ask for help to lift heavy or awkward objects. This information is not intended to replace advice given to you by your health care provider. Make sure you discuss any questions you have with your health care provider. Document Revised: 08/30/2022 Document Reviewed: 07/14/2020 Elsevier Patient Education  2024 [employer] Hand Exercises Hand exercises can be helpful for almost anyone. They can strengthen your hands and improve flexibility and movement. The exercises can also increase blood flow to the hands. These results can make your work and daily tasks easier for you. Hand exercises can be especially helpful for people who have joint pain from arthritis or nerve damage from using their hands over and over. These exercises can also help people who injure a hand. Exercises Most of these hand exercises are gentle stretching and motion exercises. It is usually safe to do them often throughout the day. Warming up your hands before exercise may help reduce stiffness. You can do this with gentle massage or by placing your hands in warm water for 10-15 minutes. It is normal to feel some stretching, pulling, tightness, or mild discomfort when you begin new exercises. In time, this will improve. Remember to always be careful and stop right away if you feel sudden, very bad pain or your pain gets worse. You want to get better and be safe. Ask your health care provider which exercises are safe for you. Do exercises exactly as told by your provider and adjust them as told. Do not begin these exercises until  told by your provider. Knuckle bend or "claw" fist  Stand or sit with your arm, hand, and all five fingers pointed straight up. Make sure to keep your wrist straight. Gently bend your fingers down toward your palm until the tips of your fingers are touching your palm. Keep your big knuckle straight and only bend the small knuckles in your fingers. Hold this position for 10 seconds. Straighten your fingers back to your starting position. Repeat this exercise 5-10 times with each hand. Full finger fist  Stand or sit with your arm, hand, and all five  fingers pointed straight up. Make sure to keep your wrist straight. Gently bend your fingers into your palm until the tips of your fingers are touching the middle of your palm. Hold this position for 10 seconds. Extend your fingers back to your starting position, stretching every joint fully. Repeat this exercise 5-10 times with each hand. Straight fist  Stand or sit with your arm, hand, and all five fingers pointed straight up. Make sure to keep your wrist straight. Gently bend your fingers at the big knuckle, where your fingers meet your hand, and at the middle knuckle. Keep the knuckle at the tips of your fingers straight and try to touch the bottom of your palm. Hold this position for 10 seconds. Extend your fingers back to your starting position, stretching every joint fully. Repeat this exercise 5-10 times with each hand. Tabletop  Stand or sit with your arm, hand, and all five fingers pointed straight up. Make sure to keep your wrist straight. Gently bend your fingers at the big knuckle, where your fingers meet your hand, as far down as you can. Keep the small knuckles in your fingers straight. Think of forming a tabletop with your fingers. Hold this position for 10 seconds. Extend your fingers back to your starting position, stretching every joint fully. Repeat this exercise 5-10 times with each hand. Finger spread  Place your hand  flat on a table with your palm facing down. Make sure your wrist stays straight. Spread your fingers and thumb apart from each other as far as you can until you feel a gentle stretch. Hold this position for 10 seconds. Bring your fingers and thumb tight together again. Hold this position for 10 seconds. Repeat this exercise 5-10 times with each hand. Making circles  Stand or sit with your arm, hand, and all five fingers pointed straight up. Make sure to keep your wrist straight. Make a circle by touching the tip of your thumb to the tip of your index finger. Hold for 10 seconds. Then open your hand wide. Repeat this motion with your thumb and each of your fingers. Repeat this exercise 5-10 times with each hand. Thumb motion  Sit with your forearm resting on a table and your wrist straight. Your thumb should be facing up toward the ceiling. Keep your fingers relaxed as you move your thumb. Lift your thumb up as high as you can toward the ceiling. Hold for 10 seconds. Bend your thumb across your palm as far as you can, reaching the tip of your thumb for the small finger (pinkie) side of your palm. Hold for 10 seconds. Repeat this exercise 5-10 times with each hand. Grip strengthening  Hold a stress ball or other soft ball in the middle of your hand. Slowly increase the pressure, squeezing the ball as much as you can without causing pain. Think of bringing the tips of your fingers into the middle of your palm. All of your finger joints should bend when doing this exercise. Hold your squeeze for 10 seconds, then relax. Repeat this exercise 5-10 times with each hand. Contact a health care provider if: Your hand pain or discomfort gets much worse when you do an exercise. Your hand pain or discomfort does not improve within 2 hours after you exercise. If you have either of these problems, stop doing these exercises right away. Do not do them again unless your provider says that you can. Get help  right away if: You develop sudden, severe hand pain or  swelling. If this happens, stop doing these exercises right away. Do not do them again unless your provider says that you can. This information is not intended to replace advice given to you by your health care provider. Make sure you discuss any questions you have with your health care provider. Document Revised: 05/11/2022 Document Reviewed: 05/11/2022 Elsevier Patient Education  2024 [employer].

## 2023-09-19 NOTE — Telephone Encounter (Signed)
 Anastrozole  usually causes generalized achiness.  It would not cause pain specific to her hands.

## 2023-09-19 NOTE — Telephone Encounter (Signed)
Attempted to contact the patient. Unable to leave a message, voicemail not set up.

## 2023-09-20 NOTE — Telephone Encounter (Signed)
 Patient advised the pain is most likely coming from osteoarthritis. Patient expressed understanding.

## 2023-09-20 NOTE — Telephone Encounter (Signed)
 The pain is most likely coming from osteoarthritis.

## 2023-09-20 NOTE — Telephone Encounter (Signed)
 Patient advised Anastrozole  usually causes generalized achiness.  It would not cause pain specific to her hands. Patient states she is just having pain in her hands and the tops of her feet only.

## 2023-09-20 NOTE — Telephone Encounter (Signed)
 Attempted to contact the patient and left message for patient to call the office.

## 2023-09-21 DIAGNOSIS — Z87442 Personal history of urinary calculi: Secondary | ICD-10-CM | POA: Diagnosis not present

## 2023-09-21 DIAGNOSIS — E663 Overweight: Secondary | ICD-10-CM | POA: Diagnosis not present

## 2023-09-21 DIAGNOSIS — M8580 Other specified disorders of bone density and structure, unspecified site: Secondary | ICD-10-CM | POA: Diagnosis not present

## 2023-09-21 DIAGNOSIS — R82998 Other abnormal findings in urine: Secondary | ICD-10-CM | POA: Diagnosis not present

## 2023-09-21 DIAGNOSIS — M109 Gout, unspecified: Secondary | ICD-10-CM | POA: Diagnosis not present

## 2023-09-21 DIAGNOSIS — E7800 Pure hypercholesterolemia, unspecified: Secondary | ICD-10-CM | POA: Diagnosis not present

## 2023-09-21 DIAGNOSIS — I10 Essential (primary) hypertension: Secondary | ICD-10-CM | POA: Diagnosis not present

## 2023-09-21 DIAGNOSIS — Z1331 Encounter for screening for depression: Secondary | ICD-10-CM | POA: Diagnosis not present

## 2023-09-21 DIAGNOSIS — Z1339 Encounter for screening examination for other mental health and behavioral disorders: Secondary | ICD-10-CM | POA: Diagnosis not present

## 2023-09-21 DIAGNOSIS — Z0000 Encounter for general adult medical examination without abnormal findings: Secondary | ICD-10-CM | POA: Diagnosis not present

## 2023-09-21 DIAGNOSIS — E039 Hypothyroidism, unspecified: Secondary | ICD-10-CM | POA: Diagnosis not present

## 2023-09-21 DIAGNOSIS — D696 Thrombocytopenia, unspecified: Secondary | ICD-10-CM | POA: Diagnosis not present

## 2023-09-21 DIAGNOSIS — C50911 Malignant neoplasm of unspecified site of right female breast: Secondary | ICD-10-CM | POA: Diagnosis not present

## 2023-10-04 DIAGNOSIS — M8589 Other specified disorders of bone density and structure, multiple sites: Secondary | ICD-10-CM | POA: Diagnosis not present

## 2023-10-04 LAB — HM DEXA SCAN

## 2023-10-07 ENCOUNTER — Telehealth: Payer: Self-pay

## 2023-10-07 NOTE — Telephone Encounter (Signed)
 Received DEXA results from [facility]  Date of Scan: 10/04/2023  Lowest T-score:-1.5  BMD:0.880  Lowest site measured:AP Spine   DX: Osteopenia  Significant changes in BMD and site measured (5% and above):N/A  Current Regimen:Calcium and Vitamin D   Recommendation:Continue calcium and vitamin d  as well as resistive exercises  Reviewed by:Jacinta Martinis  Next Appointment: 03/21/24   Patient expressed verbal understanding

## 2023-11-21 ENCOUNTER — Ambulatory Visit: Attending: General Surgery

## 2023-11-21 VITALS — Wt 165.0 lb

## 2023-11-21 DIAGNOSIS — Z483 Aftercare following surgery for neoplasm: Secondary | ICD-10-CM | POA: Insufficient documentation

## 2023-11-21 NOTE — Therapy (Signed)
 OUTPATIENT PHYSICAL THERAPY SOZO SCREENING NOTE   Patient Name: Jo Garcia MRN: [id number] DOB:07-07-1955, 68 y.o., female Today's Date: 11/21/2023  PCP: Vernadine Charlie ORN, MD REFERRING PROVIDER: Curvin Deward MOULD, MD   PT End of Session - 11/21/23 [phone]     Visit Number 2   # unchanged due to screen only   PT Start Time 0835    PT Stop Time 0839    PT Time Calculation (min) 4 min    Activity Tolerance Patient tolerated treatment well    Behavior During Therapy [facility] for tasks assessed/performed          Past Medical History:  Diagnosis Date   Anemia    Breast cancer (HCC)    Chronic kidney disease    uric acid kidney stones   COVID-19 01/02/2023   Family history of breast cancer 03/24/2022   Family history of colon cancer 03/24/2022   Family history of prostate cancer 03/24/2022   GERD (gastroesophageal reflux disease)    past hx - uses ranitadine OTC prn only    Gout    History of radiation therapy    Right breast- 05/25/22-06/22/22- Dr. Lynwood Nasuti   Hyperlipidemia    Hypertension    Lumbar spine pain    Neuromuscular disorder (HCC)    hiatal hernia   Osteoarthritis    thumbs, big toe left foot, top both feet   Personal history of radiation therapy    PONV (postoperative nausea and vomiting)    Thyroid  disease    Past Surgical History:  Procedure Laterality Date   ABLATION     BREAST BIOPSY  04/16/2022   US  RT RADIOACTIVE SEED LOC 04/16/2022 [facility]   BREAST LUMPECTOMY Right 04/2022   BREAST LUMPECTOMY WITH RADIOACTIVE SEED AND SENTINEL LYMPH NODE BIOPSY Right 04/19/2022   Procedure: RIGHT BREAST LUMPECTOMY WITH RADIOACTIVE SEED AND SENTINEL LYMPH NODE BIOPSY;  Surgeon: Curvin Deward MOULD, MD;  Location: [facility];  Service: General;  Laterality: Right;   COLONOSCOPY  2018   KN-MAC-suprep(exc)-tics   DILATION AND CURETTAGE OF UTERUS     no anesthesia during this procedure   EYE SURGERY Bilateral 12/2017   eyelid surgery     SHOULDER SURGERY Right    repair of tear    Patient Active Problem List   Diagnosis Date Noted   Genetic testing 04/06/2022   Family history of breast cancer 03/24/2022   Family history of prostate cancer 03/24/2022   Family history of colon cancer 03/24/2022   Malignant neoplasm of upper-outer quadrant of right breast in female, estrogen receptor positive (HCC) 03/22/2022   Medial epicondylitis of both elbows 01/18/2020   Dyslipidemia 11/04/2016   Osteoarthritis of foot 04/30/2016   History of hypertension 04/30/2016   History of renal calculi 04/30/2016   Vitamin D  deficiency 04/30/2016   History of hypothyroidism 04/30/2016   Primary osteoarthritis of both hands 04/30/2016   Primary osteoarthritis of both knees 04/30/2016   Idiopathic chronic gout of multiple sites without tophus 04/30/2016    REFERRING DIAG: right breast cancer at risk for lymphedema  THERAPY DIAG: Aftercare following surgery for neoplasm  PERTINENT HISTORY: Patient was diagnosed on 02/22/2022 with right grade 2 invasive ductal carcinoma breast cancer. It measures 1.2 cm and is located in the upper inner quadrant. It is ER/PR positive and HER2 negative with a Ki67 of 20%. She had a Rt lumpectomy and SLNB on 04/19/22 with 4 negative nodes removed.  Will be having radiation   PRECAUTIONS:  right UE Lymphedema risk, None  SUBJECTIVE: Pt returns for her 3 month L-Dex screen.   PAIN:  Are you having pain? No  SOZO SCREENING: Patient was assessed today using the SOZO machine to determine the lymphedema index score. This was compared to her baseline score. It was determined that she is within the recommended range when compared to her baseline and no further action is needed at this time. She will continue SOZO screenings. These are done every 3 months for 2 years post operatively followed by every 6 months for 2 years, and then annually.   L-DEX FLOWSHEETS - 11/21/23 0800       L-DEX LYMPHEDEMA SCREENING    Measurement Type Unilateral    L-DEX MEASUREMENT EXTREMITY Upper Extremity    POSITION  Standing    DOMINANT SIDE Right    At Risk Side Right    BASELINE SCORE (UNILATERAL) -0.7    L-DEX SCORE (UNILATERAL) 1.7    VALUE CHANGE (UNILAT) 2.4            Aden Berwyn Caldron, PTA 11/21/2023, 8:37 AM

## 2023-12-13 DIAGNOSIS — H6992 Unspecified Eustachian tube disorder, left ear: Secondary | ICD-10-CM | POA: Diagnosis not present

## 2023-12-13 DIAGNOSIS — H6122 Impacted cerumen, left ear: Secondary | ICD-10-CM | POA: Diagnosis not present

## 2023-12-13 DIAGNOSIS — Z0110 Encounter for examination of ears and hearing without abnormal findings: Secondary | ICD-10-CM | POA: Diagnosis not present

## 2024-01-12 ENCOUNTER — Other Ambulatory Visit: Payer: Self-pay | Admitting: Obstetrics and Gynecology

## 2024-01-12 DIAGNOSIS — Z853 Personal history of malignant neoplasm of breast: Secondary | ICD-10-CM

## 2024-01-19 ENCOUNTER — Encounter

## 2024-02-11 DIAGNOSIS — Z23 Encounter for immunization: Secondary | ICD-10-CM | POA: Diagnosis not present

## 2024-02-20 ENCOUNTER — Ambulatory Visit: Attending: General Surgery

## 2024-02-20 VITALS — Wt 167.4 lb

## 2024-02-20 DIAGNOSIS — Z483 Aftercare following surgery for neoplasm: Secondary | ICD-10-CM | POA: Insufficient documentation

## 2024-02-20 NOTE — Therapy (Signed)
 OUTPATIENT PHYSICAL THERAPY SOZO SCREENING NOTE   Patient Name: Jo Garcia MRN: [id number] DOB:13-May-1955, 68 y.o., female Today's Date: 02/20/2024  PCP: Vernadine Charlie ORN, MD REFERRING PROVIDER: Curvin Deward MOULD, MD   PT End of Session - 02/20/24 0824     Visit Number 2    PT Start Time 9176    PT Stop Time 0827    PT Time Calculation (min) 4 min    Activity Tolerance Patient tolerated treatment well    Behavior During Therapy [facility] for tasks assessed/performed          Past Medical History:  Diagnosis Date   Anemia    Breast cancer (HCC)    Chronic kidney disease    uric acid kidney stones   COVID-19 01/02/2023   Family history of breast cancer 03/24/2022   Family history of colon cancer 03/24/2022   Family history of prostate cancer 03/24/2022   GERD (gastroesophageal reflux disease)    past hx - uses ranitadine OTC prn only    Gout    History of radiation therapy    Right breast- 05/25/22-06/22/22- Dr. Lynwood Nasuti   Hyperlipidemia    Hypertension    Lumbar spine pain    Neuromuscular disorder (HCC)    hiatal hernia   Osteoarthritis    thumbs, big toe left foot, top both feet   Personal history of radiation therapy    PONV (postoperative nausea and vomiting)    Thyroid  disease    Past Surgical History:  Procedure Laterality Date   ABLATION     BREAST BIOPSY  04/16/2022   US  RT RADIOACTIVE SEED LOC 04/16/2022 [facility]MO[facility]   BREAST LUMPECTOMY Right 04/2022   BREAST LUMPECTOMY WITH RADIOACTIVE SEED AND SENTINEL LYMPH NODE BIOPSY Right 04/19/2022   Procedure: RIGHT BREAST LUMPECTOMY WITH RADIOACTIVE SEED AND SENTINEL LYMPH NODE BIOPSY;  Surgeon: Curvin Deward MOULD, MD;  Location: [facility];  Service: General;  Laterality: Right;   COLONOSCOPY  2018   KN-MAC-suprep(exc)-tics   DILATION AND CURETTAGE OF UTERUS     no anesthesia during this procedure   EYE SURGERY Bilateral 12/2017   eyelid surgery    SHOULDER SURGERY Right    repair  of tear    Patient Active Problem List   Diagnosis Date Noted   Genetic testing 04/06/2022   Family history of breast cancer 03/24/2022   Family history of prostate cancer 03/24/2022   Family history of colon cancer 03/24/2022   Malignant neoplasm of upper-outer quadrant of right breast in female, estrogen receptor positive (HCC) 03/22/2022   Medial epicondylitis of both elbows 01/18/2020   Dyslipidemia 11/04/2016   Osteoarthritis of foot 04/30/2016   History of hypertension 04/30/2016   History of renal calculi 04/30/2016   Vitamin D  deficiency 04/30/2016   History of hypothyroidism 04/30/2016   Primary osteoarthritis of both hands 04/30/2016   Primary osteoarthritis of both knees 04/30/2016   Idiopathic chronic gout of multiple sites without tophus 04/30/2016    REFERRING DIAG: right breast cancer at risk for lymphedema  THERAPY DIAG: Aftercare following surgery for neoplasm  PERTINENT HISTORY: Patient was diagnosed on 02/22/2022 with right grade 2 invasive ductal carcinoma breast cancer. It measures 1.2 cm and is located in the upper inner quadrant. It is ER/PR positive and HER2 negative with a Ki67 of 20%. She had a Rt lumpectomy and SLNB on 04/19/22 with 4 negative nodes removed.  Will be having radiation   PRECAUTIONS: right UE Lymphedema risk, None  SUBJECTIVE:  Pt returns for her last 3 month L-Dex screen.   PAIN:  Are you having pain? No  SOZO SCREENING: Patient was assessed today using the SOZO machine to determine the lymphedema index score. This was compared to her baseline score. It was determined that she is within the recommended range when compared to her baseline and no further action is needed at this time. She will continue SOZO screenings. These are done every 3 months for 2 years post operatively followed by every 6 months for 2 years, and then annually.   L-DEX FLOWSHEETS - 02/20/24 0800       L-DEX LYMPHEDEMA SCREENING   Measurement Type Unilateral     L-DEX MEASUREMENT EXTREMITY Upper Extremity    POSITION  Standing    DOMINANT SIDE Right    At Risk Side Right    BASELINE SCORE (UNILATERAL) -0.7    L-DEX SCORE (UNILATERAL) -1.6    VALUE CHANGE (UNILAT) -0.9            Aden Berwyn Caldron, PTA 02/20/2024, 8:26 AM

## 2024-02-27 ENCOUNTER — Ambulatory Visit
Admission: RE | Admit: 2024-02-27 | Discharge: 2024-02-27 | Disposition: A | Discharge status: Home / Self Care | Source: Ambulatory Visit | Attending: Obstetrics and Gynecology | Admitting: Obstetrics and Gynecology

## 2024-02-27 DIAGNOSIS — Z853 Personal history of malignant neoplasm of breast: Secondary | ICD-10-CM

## 2024-02-27 DIAGNOSIS — R928 Other abnormal and inconclusive findings on diagnostic imaging of breast: Secondary | ICD-10-CM | POA: Diagnosis not present

## 2024-02-28 DIAGNOSIS — Z01419 Encounter for gynecological examination (general) (routine) without abnormal findings: Secondary | ICD-10-CM | POA: Diagnosis not present

## 2024-03-07 NOTE — Progress Notes (Signed)
 Office Visit Note  Patient: Jo Garcia             Date of Birth: [birth year]           MRN: [id number]             PCP: Vernadine Charlie ORN, MD Referring: Vernadine Charlie ORN, MD Visit Date: 03/21/2024 Occupation: TEACHER  Subjective:  Both hands  History of Present Illness: [name] is a 68 y.o. female with gout, osteoarthritis, degenerative disc disease and osteopenia.  She returns today after her last visit in May 2025.  She has not had a gout flare and has been taking allopurinol 300 mg daily.  She continues to have pain and stiffness in her bilateral hands.  She has intermittent triggering of her of the right index finger and middle finger.  She states her knee joints are not as painful.  She has intermittent discomfort in her feet with the weather change.  She states her lower back pain has improved since she has been doing some neck stretching exercises and Pilates.  She had a DEXA scan repeated by Dr. Vernadine this year it was stable.    Activities of Daily Living:  Patient reports morning stiffness for all day.   Patient Reports nocturnal pain.  Difficulty dressing/grooming: Reports Difficulty climbing stairs: Denies Difficulty getting out of chair: Denies Difficulty using hands for taps, buttons, cutlery, and/or writing: Reports  Review of Systems  Constitutional:  Positive for fatigue.  HENT:  Negative for mouth sores and mouth dryness.   Eyes:  Positive for dryness.  Respiratory:  Negative for shortness of breath.   Cardiovascular:  Negative for chest pain and palpitations.  Gastrointestinal:  Negative for blood in stool, constipation and diarrhea.  Endocrine: Negative for increased urination.  Genitourinary:  Negative for involuntary urination.  Musculoskeletal:  Positive for joint pain, joint pain, muscle weakness and morning stiffness. Negative for gait problem, joint swelling, myalgias, muscle tenderness and myalgias.  Skin:  Positive for sensitivity  to sunlight. Negative for color change, rash and hair loss.  Allergic/Immunologic: Negative for susceptible to infections.  Neurological:  Negative for dizziness and headaches.  Hematological:  Negative for swollen glands.  Psychiatric/Behavioral:  Negative for depressed mood and sleep disturbance. The patient is not nervous/anxious.     PMFS History:  Patient Active Problem List   Diagnosis Date Noted   Genetic testing 04/06/2022   Family history of breast cancer 03/24/2022   Family history of prostate cancer 03/24/2022   Family history of colon cancer 03/24/2022   Malignant neoplasm of upper-outer quadrant of right breast in female, estrogen receptor positive (HCC) 03/22/2022   Medial epicondylitis of both elbows 01/18/2020   Dyslipidemia 11/04/2016   Osteoarthritis of foot 04/30/2016   History of hypertension 04/30/2016   History of renal calculi 04/30/2016   Vitamin D  deficiency 04/30/2016   History of hypothyroidism 04/30/2016   Primary osteoarthritis of both hands 04/30/2016   Primary osteoarthritis of both knees 04/30/2016   Idiopathic chronic gout of multiple sites without tophus 04/30/2016    Past Medical History:  Diagnosis Date   Anemia    Breast cancer (HCC)    Chronic kidney disease    uric acid kidney stones   COVID-19 01/02/2023   Family history of breast cancer 03/24/2022   Family history of colon cancer 03/24/2022   Family history of prostate cancer 03/24/2022   GERD (gastroesophageal reflux disease)    past hx - uses ranitadine OTC  prn only    Gout    History of radiation therapy    Right breast- 05/25/22-06/22/22- Dr. Lynwood Nasuti   Hyperlipidemia    Hypertension    Lumbar spine pain    Neuromuscular disorder (HCC)    hiatal hernia   Osteoarthritis    thumbs, big toe left foot, top both feet   Personal history of radiation therapy    PONV (postoperative nausea and vomiting)    Thyroid  disease     Family History  Problem Relation Age of Onset    Rheum arthritis Mother    Tuberculosis Mother        spinal tuberculosis?   Heart attack Father    Breast cancer Maternal Aunt        dx 58s   Prostate cancer Maternal Uncle        mets; d. 70s   Rheum arthritis Daughter    Hypertension Daughter    Healthy Son    Colon cancer Cousin        maternal cousins x4; one dx before age 54; others dx after age 51   Colon polyps Neg Hx    Esophageal cancer Neg Hx    Rectal cancer Neg Hx    Stomach cancer Neg Hx    Past Surgical History:  Procedure Laterality Date   ABLATION     BREAST BIOPSY  04/16/2022   US  RT RADIOACTIVE SEED LOC 04/16/2022 [facility]   BREAST LUMPECTOMY Right 04/2022   BREAST LUMPECTOMY WITH RADIOACTIVE SEED AND SENTINEL LYMPH NODE BIOPSY Right 04/19/2022   Procedure: RIGHT BREAST LUMPECTOMY WITH RADIOACTIVE SEED AND SENTINEL LYMPH NODE BIOPSY;  Surgeon: Curvin Deward MOULD, MD;  Location: [facility];  Service: General;  Laterality: Right;   COLONOSCOPY  2018   KN-MAC-suprep(exc)-tics   DILATION AND CURETTAGE OF UTERUS     no anesthesia during this procedure   EYE SURGERY Bilateral 12/2017   eyelid surgery    SHOULDER SURGERY Right    repair of tear    Social History   Tobacco Use   Smoking status: Never    Passive exposure: Past   Smokeless tobacco: Never  Vaping Use   Vaping status: Never Used  Substance Use Topics   Alcohol  use: Never   Drug use: Never   Social History   Social History Narrative   Lives with husband   Right handed   Caffeine: occasionally tea or coke      Immunization History  Administered Date(s) Administered   PFIZER(Purple Top)SARS-COV-2 Vaccination 07/25/2019, 08/15/2019, 02/11/2020, 01/15/2023     Objective: Vital Signs: BP 113/74   Pulse (!) 52   Temp 97.9 F (36.6 C)   Resp 15   Ht 5' 4.5 (1.638 m)   Wt 166 lb (75.3 kg)   BMI 28.05 kg/m    Physical Exam Vitals and nursing note reviewed.  Constitutional:      Appearance: She is  well-developed.  HENT:     Head: Normocephalic and atraumatic.  Eyes:     Conjunctiva/sclera: Conjunctivae normal.  Cardiovascular:     Rate and Rhythm: Normal rate and regular rhythm.     Heart sounds: Normal heart sounds.  Pulmonary:     Effort: Pulmonary effort is normal.     Breath sounds: Normal breath sounds.  Abdominal:     General: Bowel sounds are normal.     Palpations: Abdomen is soft.  Musculoskeletal:     Cervical back: Normal range of motion.  Lymphadenopathy:  Cervical: No cervical adenopathy.  Skin:    General: Skin is warm and dry.     Capillary Refill: Capillary refill takes less than 2 seconds.  Neurological:     Mental Status: She is alert and oriented to person, place, and time.  Psychiatric:        Behavior: Behavior normal.      Musculoskeletal Exam: She had limited lateral rotation of her cervical spine.  She had full flexion and extension without discomfort.  She had good mobility in thoracic and lumbar spine with some thoracic kyphosis.  There was no SI joint tenderness.  Shoulder joints, elbow joints, wrist joints, MCPs, PIPs and DIPs were in good range of motion with no synovitis.  Bilateral PIP and DIP thickening was noted.  Mucinous cyst was noted over the right middle finger distal phalanx.  She had thickening of most of her flexor tendons.  She had some limitation with range of motion of her hip joints without discomfort.  Knee joints were in good range of motion without any warmth swelling or effusion.  There was no tenderness over ankles or MTPs.  Dorsal spurs were noted.  CDAI Exam: CDAI Score: -- Patient Global: --; Provider Global: -- Swollen: --; Tender: -- Joint Exam 03/21/2024   No joint exam has been documented for this visit   There is currently no information documented on the homunculus. Go to the Rheumatology activity and complete the homunculus joint exam.  Investigation: No additional findings.  Imaging: MM 3D DIAGNOSTIC  MAMMOGRAM BILATERAL BREAST Result Date: 02/27/2024 CLINICAL DATA:  68 year old female with history right breast lumpectomy in December 2023 for IDC. EXAM: DIGITAL DIAGNOSTIC BILATERAL MAMMOGRAM WITH TOMOSYNTHESIS AND CAD TECHNIQUE: Bilateral digital diagnostic mammography and breast tomosynthesis was performed. The images were evaluated with computer-aided detection. COMPARISON:  Previous exam(s). ACR Breast Density Category c: The breasts are heterogeneously dense, which may obscure small masses. FINDINGS: Sequela of right breast lumpectomy. No findings suspicious for malignancy in either breast. IMPRESSION: No mammographic evidence of malignancy. RECOMMENDATION: Bilateral diagnostic mammogram in 1 year. I have discussed the findings and recommendations with the patient. If applicable, a reminder letter will be sent to the patient regarding the next appointment. BI-RADS CATEGORY  2: Benign. Electronically Signed   By: Curtistine Noble   On: 02/27/2024 11:28    Recent Labs: Lab Results  Component Value Date   WBC 6.6 03/24/2022   HGB 14.2 03/24/2022   PLT 158 03/24/2022   NA 139 04/13/2022   K 3.7 04/13/2022   CL 102 04/13/2022   CO2 29 04/13/2022   GLUCOSE 92 04/13/2022   BUN 20 04/13/2022   CREATININE 1.21 (H) 04/13/2022   BILITOT 0.7 03/24/2022   ALKPHOS 69 03/24/2022   AST 23 03/24/2022   ALT 22 03/24/2022   PROT 7.4 03/24/2022   ALBUMIN 4.1 03/24/2022   CALCIUM 9.2 04/13/2022   GFRAA 52 (L) 07/18/2020    Speciality Comments: No specialty comments available.  Procedures:  No procedures performed Allergies: Penicillins; Radiaplexrx [skin protectants, misc.]; Sulfa antibiotics; Bactrim [sulfamethoxazole-trimethoprim]; and Codeine   Assessment / Plan:     Visit Diagnoses: Idiopathic chronic gout of multiple sites without tophus -she is on allopurinol 300 mg p.o. daily by Dr. [name].  Patient denies having a gout flare.  She states labs are monitored by Dr. Eura office which  are in the desirable range.  Primary osteoarthritis of both hands-she has severe osteoarthritis of her hands with bilateral PIP and DIP thickening.  Joint protection muscle strengthening was discussed.  A handout on hand exercises was provided.  Trigger finger, right middle finger-she has intermittent triggering of the right middle finger which is not very symptomatic currently.  She had thickening of most of her flexor tendons.  Primary osteoarthritis of both knees-no warmth swelling or effusion was noted.  Primary osteoarthritis of both feet-she continues to have some discomfort in her feet.  Bilateral dorsal spurs were noted.  Proper fitting shoes were advised.  Spondylosis of lumbar spine -she has intermittent discomfort in her lower back.  She has been going to Pilates and doing stretching exercises which helped.  L4-L5 spondylolisthesis, mild to moderate spinal stenosis at L4-5 and facet joint arthropathy.  A handout on core strength exercises was given.  Malignant neoplasm of upper-outer quadrant of right breast in female, estrogen receptor positive (HCC) - diagnosed 03/2022, s/p lumpectomy, LN resection, RTX, she is on Anasrrozole.  Osteoporosis screening - DEXA scan 10/04/2023 BMD 0.880, T-score -1.5 lumbar spine.  I reviewed DEXA scan with the patient.  Calcium rich diet and vitamin D  was discussed.  Need for regular exercise was emphasized.  History of hypertension-blood pressure was normal at 113/74 on benazepril HCTZ and benazepril.  History of renal calculi  History of vitamin D  deficiency-she takes vitamin D  on a regular basis.  Dyslipidemia-she is on Lipitor 10 mg p.o. daily.  History of hypothyroidism  Orders: No orders of the defined types were placed in this encounter.  No orders of the defined types were placed in this encounter.    Follow-Up Instructions: Return in about 6 months (around 09/18/2024) for Osteoarthritis, Osteoporosis.   Maya Nash,  MD  Note - This record has been created using Animal nutritionist.  Chart creation errors have been sought, but may not always  have been located. Such creation errors do not reflect on  the standard of medical care.

## 2024-03-19 NOTE — Assessment & Plan Note (Signed)
 04/19/2022:Right lumpectomy: Grade 2 IDC 1.5 cm, margins negative, 0/4 lymph nodes negative, ER 95%, PR 90%, Ki-67 20%, HER2 negative    Treatment plan: Oncotype DX score 8 (ROR 3%) Adjuvant radiation therapy 05/26/2022-06/22/2022 Adjuvant antiestrogen therapy with anastrozole  started February 2024 Day after her surgery.  Patient's husband had a cerebellar stroke.  He is doing extremely well ------------------------------------------------------------------------------------------------------------------------------- Anastrozole  toxicities: Tolerating anastrozole  extremely well.  Does not have any hot flashes.  She does have joint stiffness but it has been even prior to starting anastrozole  therapy.   Lifestyle: Patient does Pilates and walks every day.  She also takes care of her yard work as well.   Breast cancer surveillance: Mammogram 02/27/2024: Benign breast density category C Bone density April 2024: T-score -2: Osteopenia: Takes calcium and vitamin D . Breast exam 03/20/2024: Benign   General Health Maintenance -Continue with regular physical activity, including Pilates, walking, and yard work. -Consider utilizing insurance-covered gym memberships if available. -Next appointment in one year.

## 2024-03-20 ENCOUNTER — Inpatient Hospital Stay: Payer: Medicare PPO | Attending: Hematology and Oncology | Admitting: Hematology and Oncology

## 2024-03-20 VITALS — BP 100/70 | HR 53 | Temp 98.3°F | Resp 18 | Ht 65.5 in | Wt 167.4 lb

## 2024-03-20 DIAGNOSIS — Z170 Estrogen receptor positive status [ER+]: Secondary | ICD-10-CM | POA: Diagnosis not present

## 2024-03-20 DIAGNOSIS — Z79899 Other long term (current) drug therapy: Secondary | ICD-10-CM | POA: Diagnosis not present

## 2024-03-20 DIAGNOSIS — Z923 Personal history of irradiation: Secondary | ICD-10-CM | POA: Diagnosis not present

## 2024-03-20 DIAGNOSIS — C50411 Malignant neoplasm of upper-outer quadrant of right female breast: Secondary | ICD-10-CM | POA: Diagnosis not present

## 2024-03-20 NOTE — Progress Notes (Signed)
 Patient Care Team: [name], Charlie ORN, MD as PCP - General (Internal Medicine) Odean Potts, MD as Consulting Physician (Hematology and Oncology) Shannon Agent, MD as Consulting Physician (Radiation Oncology) Curvin Deward MOULD, MD as Consulting Physician (General Surgery) Estelle Service, MD as Consulting Physician (Obstetrics and Gynecology)  DIAGNOSIS:  Encounter Diagnosis  Name Primary?   Malignant neoplasm of upper-outer quadrant of right breast in female, estrogen receptor positive (HCC) Yes    SUMMARY OF ONCOLOGIC HISTORY: Oncology History  Malignant neoplasm of upper-outer quadrant of right breast in female, estrogen receptor positive (HCC)  03/12/2022 Initial Diagnosis   Screening mammogram detected right breast asymmetry at 12 o'clock position by ultrasound measured 1.2 cm, axilla negative, biopsy revealed grade 2 IDC ER 95% PR 90% HER2 equivalent FISH negative ratio 1.17, Ki-67 20%   03/24/2022 Cancer Staging   Staging form: Breast, AJCC 8th Edition - Clinical stage from 03/24/2022: Stage IA (cT1, cN0, cM0, G2, ER+, PR+, HER2-) - Signed by Odean Potts, MD on 03/24/2022 Stage prefix: Initial diagnosis Histologic grading system: 3 grade system Laterality: Right Staged by: Pathologist and managing physician Stage used in treatment planning: Yes National guidelines used in treatment planning: Yes Type of national guideline used in treatment planning: NCCN   04/03/2022 Genetic Testing   Negative genetics--[facility] CustomNext-Cancer +RNAinsight Panel.  Report date is 04/03/2022.   The CustomNext-Cancer+RNAinsight panel offered by [facility] includes sequencing and rearrangement analysis for the following 47 genes:  APC, ATM, AXIN2, BARD1, BMPR1A, BRCA1, BRCA2, BRIP1, CDH1, CDK4, CDKN2A, CHEK2, DICER1, EPCAM, GREM1, HOXB13, MEN1, MLH1, MSH2, MSH3, MSH6, MUTYH, NBN, NF1, NF2, NTHL1, PALB2, PMS2, POLD1, POLE, PTEN, RAD51C, RAD51D, RECQL, RET, SDHA, SDHAF2, SDHB, SDHC, SDHD,  SMAD4, SMARCA4, STK11, TP53, TSC1, TSC2, and VHL.  RNA data is routinely analyzed for use in variant interpretation for all genes.   04/19/2022 Surgery   Right lumpectomy: Grade 2 IDC 1.5 cm, margins negative, 0/4 lymph nodes negative, ER 95%, PR 90%, Ki-67 20%, HER2 negative   04/19/2022 Oncotype testing   8/3%   05/25/2022 - 06/22/2022 Radiation Therapy   Site Technique Total Dose (Gy) Dose per Fx (Gy) Completed Fx Beam Energies  Breast, Right: Breast_R 3D 40.05/40.05 2.67 15/15 6XFFF, 10XFFF  Breast, Right: Breast_R_Bst specialPort 12/12 2 6/6 12E, 15E     06/2022 -  Anti-estrogen oral therapy   Anastrozole  x 5-7 years     CHIEF COMPLIANT: Surveillance of breast cancer on anastrozole  therapy  HISTORY OF PRESENT ILLNESS: History of Present Illness Jo Garcia is a 68 year old female with breast cancer who presents for follow-up regarding her ongoing treatment with anastrozole .  She has been on anastrozole  for eight months and experiences stiffness in her hands, particularly in the morning, affecting fine dexterity. She has difficulty with activities requiring fine motor skills, such as needlework, but can perform most daily activities. Occasional hot flashes occur, especially when stressed, but are manageable. Her last bone density scan in May 2025 showed a T-score of -1.5. She takes vitamin D  and calcium supplements daily due to lactose intolerance, although she can tolerate hard cheeses.     ALLERGIES:  is allergic to penicillins; radiaplexrx [skin protectants, misc.]; sulfa antibiotics; bactrim [sulfamethoxazole-trimethoprim]; and codeine.  MEDICATIONS:  Current Outpatient Medications  Medication Sig Dispense Refill   allopurinol (ZYLOPRIM) 300 MG tablet Take 300 mg by mouth daily.     anastrozole  (ARIMIDEX ) 1 MG tablet TAKE 1 TABLET(1 MG) BY MOUTH DAILY 90 tablet 3   atorvastatin (LIPITOR) 10 MG  tablet Take 10 mg by mouth daily.     benazepril (LOTENSIN) 20 MG tablet  Take 20 mg by mouth daily. Alternating days with Benazepril-HCTZ     benazepril-hydrochlorthiazide (LOTENSIN HCT) 20-12.5 MG tablet Take by mouth.     diclofenac  sodium (VOLTAREN ) 1 % GEL Apply 4 g topically 4 (four) times daily as needed (arthritis).     levothyroxine (SYNTHROID, LEVOTHROID) 75 MCG tablet Take 75 mcg by mouth daily before breakfast.     metoprolol succinate (TOPROL-XL) 50 MG 24 hr tablet Take 50 mg by mouth daily. Take with or immediately following a meal.     No current facility-administered medications for this visit.    PHYSICAL EXAMINATION: ECOG PERFORMANCE STATUS: 1 - Symptomatic but completely ambulatory  Vitals:   03/20/24 0958  BP: 100/70  Pulse: (!) 53  Resp: 18  Temp: 98.3 F (36.8 C)  SpO2: 99%   Filed Weights   03/20/24 0958  Weight: 167 lb 6.4 oz (75.9 kg)    LABORATORY DATA:  I have reviewed the data as listed    Latest Ref Rng & Units 04/13/2022    3:00 PM 03/24/2022    8:16 AM 01/19/2022   11:41 AM  CMP  Glucose 70 - 99 mg/dL 92  895  91   BUN 8 - 23 mg/dL 20  25  18    Creatinine 0.44 - 1.00 mg/dL 8.78  8.77  8.79   Sodium 135 - 145 mmol/L 139  142  143   Potassium 3.5 - 5.1 mmol/L 3.7  4.2  4.6   Chloride 98 - 111 mmol/L 102  109  106   CO2 22 - 32 mmol/L 29  29  31    Calcium 8.9 - 10.3 mg/dL 9.2  9.2  9.8   Total Protein 6.5 - 8.1 g/dL  7.4  7.0   Total Bilirubin 0.3 - 1.2 mg/dL  0.7  0.5   Alkaline Phos 38 - 126 U/L  69    AST 15 - 41 U/L  23  21   ALT 0 - 44 U/L  22  16     Lab Results  Component Value Date   WBC 6.6 03/24/2022   HGB 14.2 03/24/2022   HCT 42.7 03/24/2022   MCV 91.6 03/24/2022   PLT 158 03/24/2022   NEUTROABS 4.2 03/24/2022    ASSESSMENT & PLAN:  Malignant neoplasm of upper-outer quadrant of right breast in female, estrogen receptor positive (HCC) 04/19/2022:Right lumpectomy: Grade 2 IDC 1.5 cm, margins negative, 0/4 lymph nodes negative, ER 95%, PR 90%, Ki-67 20%, HER2 negative    Treatment  plan: Oncotype DX score 8 (ROR 3%) Adjuvant radiation therapy 05/26/2022-06/22/2022 Adjuvant antiestrogen therapy with anastrozole  started February 2024 Day after her surgery.  Patient's husband had a cerebellar stroke.  He is doing extremely well ------------------------------------------------------------------------------------------------------------------------------- Anastrozole  toxicities: Tolerating anastrozole  extremely well.  Does not have any hot flashes.  She does have joint stiffness but it has been even prior to starting anastrozole  therapy.   Lifestyle: Patient does Pilates and walks every day.  She also takes care of her yard work as well.   Breast cancer surveillance: Mammogram 02/27/2024: Benign breast density category C Bone density April 2024: T-score -2: Osteopenia: Takes calcium and vitamin D . Breast exam 03/20/2024: Benign Recommend guardant reveal for MRD testing   General Health Maintenance -Continue with regular physical activity, including Pilates, walking, and yard work. -Next appointment in one year.    No orders of the defined types  were placed in this encounter.  The patient has a good understanding of the overall plan. she agrees with it. she will call with any problems that may develop before the next visit here.  I personally spent a total of 30 minutes in the care of the patient today including preparing to see the patient, getting/reviewing separately obtained history, performing a medically appropriate exam/evaluation, counseling and educating, placing orders, referring and communicating with other health care professionals, documenting clinical information in the EHR, independently interpreting results, communicating results, and coordinating care.   Viinay K Tarik Teixeira, MD 03/20/24

## 2024-03-21 ENCOUNTER — Telehealth: Payer: Self-pay

## 2024-03-21 ENCOUNTER — Ambulatory Visit: Attending: Rheumatology | Admitting: Rheumatology

## 2024-03-21 ENCOUNTER — Encounter: Payer: Self-pay | Admitting: Rheumatology

## 2024-03-21 VITALS — BP 113/74 | HR 52 | Temp 97.9°F | Resp 15 | Ht 64.5 in | Wt 166.0 lb

## 2024-03-21 DIAGNOSIS — Z13820 Encounter for screening for osteoporosis: Secondary | ICD-10-CM | POA: Diagnosis not present

## 2024-03-21 DIAGNOSIS — M65331 Trigger finger, right middle finger: Secondary | ICD-10-CM | POA: Diagnosis not present

## 2024-03-21 DIAGNOSIS — M19071 Primary osteoarthritis, right ankle and foot: Secondary | ICD-10-CM

## 2024-03-21 DIAGNOSIS — M19041 Primary osteoarthritis, right hand: Secondary | ICD-10-CM

## 2024-03-21 DIAGNOSIS — M47816 Spondylosis without myelopathy or radiculopathy, lumbar region: Secondary | ICD-10-CM | POA: Diagnosis not present

## 2024-03-21 DIAGNOSIS — M1A09X0 Idiopathic chronic gout, multiple sites, without tophus (tophi): Secondary | ICD-10-CM

## 2024-03-21 DIAGNOSIS — C50411 Malignant neoplasm of upper-outer quadrant of right female breast: Secondary | ICD-10-CM | POA: Diagnosis not present

## 2024-03-21 DIAGNOSIS — Z8639 Personal history of other endocrine, nutritional and metabolic disease: Secondary | ICD-10-CM

## 2024-03-21 DIAGNOSIS — E785 Hyperlipidemia, unspecified: Secondary | ICD-10-CM

## 2024-03-21 DIAGNOSIS — Z8679 Personal history of other diseases of the circulatory system: Secondary | ICD-10-CM | POA: Diagnosis not present

## 2024-03-21 DIAGNOSIS — Z87442 Personal history of urinary calculi: Secondary | ICD-10-CM

## 2024-03-21 DIAGNOSIS — M170 Bilateral primary osteoarthritis of knee: Secondary | ICD-10-CM | POA: Diagnosis not present

## 2024-03-21 DIAGNOSIS — M19072 Primary osteoarthritis, left ankle and foot: Secondary | ICD-10-CM

## 2024-03-21 DIAGNOSIS — M19042 Primary osteoarthritis, left hand: Secondary | ICD-10-CM

## 2024-03-21 DIAGNOSIS — Z170 Estrogen receptor positive status [ER+]: Secondary | ICD-10-CM

## 2024-03-21 NOTE — Telephone Encounter (Signed)
 Per md orders placed for [facility] through their portal.

## 2024-03-21 NOTE — Patient Instructions (Addendum)
Low Back Sprain or Strain Rehab Ask your health care provider which exercises are safe for you. Do exercises exactly as told by your health care provider and adjust them as directed. It is normal to feel mild stretching, pulling, tightness, or discomfort as you do these exercises. Stop right away if you feel sudden pain or your pain gets worse. Do not begin these exercises until told by your health care provider. Stretching and range-of-motion exercises These exercises warm up your muscles and joints and improve the movement and flexibility of your back. These exercises also help to relieve pain, numbness, and tingling. Lumbar rotation  Lie on your back on a firm bed or the floor with your knees bent. Straighten your arms out to your sides so each arm forms a 90-degree angle (right angle) with a side of your body. Slowly move (rotate) both of your knees to one side of your body until you feel a stretch in your lower back (lumbar). Try not to let your shoulders lift off the floor. Hold this position for __________ seconds. Tense your abdominal muscles and slowly move your knees back to the starting position. Repeat this exercise on the other side of your body. Repeat __________ times. Complete this exercise __________ times a day. Single knee to chest  Lie on your back on a firm bed or the floor with both legs straight. Bend one of your knees. Use your hands to move your knee up toward your chest until you feel a gentle stretch in your lower back and buttock. Hold your leg in this position by holding on to the front of your knee. Keep your other leg as straight as possible. Hold this position for __________ seconds. Slowly return to the starting position. Repeat with your other leg. Repeat __________ times. Complete this exercise __________ times a day. Prone extension on elbows  Lie on your abdomen on a firm bed or the floor (prone position). Prop yourself up on your elbows. Use your arms  to help lift your chest up until you feel a gentle stretch in your abdomen and your lower back. This will place some of your body weight on your elbows. If this is uncomfortable, try stacking pillows under your chest. Your hips should stay down, against the surface that you are lying on. Keep your hip and back muscles relaxed. Hold this position for __________ seconds. Slowly relax your upper body and return to the starting position. Repeat __________ times. Complete this exercise __________ times a day. Strengthening exercises These exercises build strength and endurance in your back. Endurance is the ability to use your muscles for a long time, even after they get tired. Pelvic tilt This exercise strengthens the muscles that lie deep in the abdomen. Lie on your back on a firm bed or the floor with your legs extended. Bend your knees so they are pointing toward the ceiling and your feet are flat on the floor. Tighten your lower abdominal muscles to press your lower back against the floor. This motion will tilt your pelvis so your tailbone points up toward the ceiling instead of pointing to your feet or the floor. To help with this exercise, you may place a small towel under your lower back and try to push your back into the towel. Hold this position for __________ seconds. Let your muscles relax completely before you repeat this exercise. Repeat __________ times. Complete this exercise __________ times a day. Alternating arm and leg raises  Get on your hands  and knees on a firm surface. If you are on a hard floor, you may want to use padding, such as an exercise mat, to cushion your knees. Line up your arms and legs. Your hands should be directly below your shoulders, and your knees should be directly below your hips. Lift your left leg behind you. At the same time, raise your right arm and straighten it in front of you. Do not lift your leg higher than your hip. Do not lift your arm higher  than your shoulder. Keep your abdominal and back muscles tight. Keep your hips facing the ground. Do not arch your back. Keep your balance carefully, and do not hold your breath. Hold this position for __________ seconds. Slowly return to the starting position. Repeat with your right leg and your left arm. Repeat __________ times. Complete this exercise __________ times a day. Abdominal set with straight leg raise  Lie on your back on a firm bed or the floor. Bend one of your knees and keep your other leg straight. Tense your abdominal muscles and lift your straight leg up, 4-6 inches (10-15 cm) off the ground. Keep your abdominal muscles tight and hold this position for __________ seconds. Do not hold your breath. Do not arch your back. Keep it flat against the ground. Keep your abdominal muscles tense as you slowly lower your leg back to the starting position. Repeat with your other leg. Repeat __________ times. Complete this exercise __________ times a day. Single leg lower with bent knees Lie on your back on a firm bed or the floor. Tense your abdominal muscles and lift your feet off the floor, one foot at a time, so your knees and hips are bent in 90-degree angles (right angles). Your knees should be over your hips and your lower legs should be parallel to the floor. Keeping your abdominal muscles tense and your knee bent, slowly lower one of your legs so your toe touches the ground. Lift your leg back up to return to the starting position. Do not hold your breath. Do not let your back arch. Keep your back flat against the ground. Repeat with your other leg. Repeat __________ times. Complete this exercise __________ times a day. Posture and body mechanics Good posture and healthy body mechanics can help to relieve stress in your body's tissues and joints. Body mechanics refers to the movements and positions of your body while you do your daily activities. Posture is part of body  mechanics. Good posture means: Your spine is in its natural S-curve position (neutral). Your shoulders are pulled back slightly. Your head is not tipped forward (neutral). Follow these guidelines to improve your posture and body mechanics in your everyday activities. Standing  When standing, keep your spine neutral and your feet about hip-width apart. Keep a slight bend in your knees. Your ears, shoulders, and hips should line up. When you do a task in which you stand in one place for a long time, place one foot up on a stable object that is 2-4 inches (5-10 cm) high, such as a footstool. This helps keep your spine neutral. Sitting  When sitting, keep your spine neutral and keep your feet flat on the floor. Use a footrest, if necessary, and keep your thighs parallel to the floor. Avoid rounding your shoulders, and avoid tilting your head forward. When working at a desk or a computer, keep your desk at a height where your hands are slightly lower than your elbows. Slide your  chair under your desk so you are close enough to maintain good posture. When working at a computer, place your monitor at a height where you are looking straight ahead and you do not have to tilt your head forward or downward to look at the screen. Resting When lying down and resting, avoid positions that are most painful for you. If you have pain with activities such as sitting, bending, stooping, or squatting, lie in a position in which your body does not bend very much. For example, avoid curling up on your side with your arms and knees near your chest (fetal position). If you have pain with activities such as standing for a long time or reaching with your arms, lie with your spine in a neutral position and bend your knees slightly. Try the following positions: Lying on your side with a pillow between your knees. Lying on your back with a pillow under your knees. Lifting  When lifting objects, keep your feet at least  shoulder-width apart and tighten your abdominal muscles. Bend your knees and hips and keep your spine neutral. It is important to lift using the strength of your legs, not your back. Do not lock your knees straight out. Always ask for help to lift heavy or awkward objects. This information is not intended to replace advice given to you by your health care provider. Make sure you discuss any questions you have with your health care provider. Document Revised: 08/30/2022 Document Reviewed: 07/14/2020 [employer] Patient Education  2024 [employer]  Hand Exercises Hand exercises can be helpful for almost anyone. They can strengthen your hands and improve flexibility and movement. The exercises can also increase blood flow to the hands. These results can make your work and daily tasks easier for you. Hand exercises can be especially helpful for people who have joint pain from arthritis or nerve damage from using their hands over and over. These exercises can also help people who injure a hand. Exercises Most of these hand exercises are gentle stretching and motion exercises. It is usually safe to do them often throughout the day. Warming up your hands before exercise may help reduce stiffness. You can do this with gentle massage or by placing your hands in warm water for 10-15 minutes. It is normal to feel some stretching, pulling, tightness, or mild discomfort when you begin new exercises. In time, this will improve. Remember to always be careful and stop right away if you feel sudden, very bad pain or your pain gets worse. You want to get better and be safe. Ask your health care provider which exercises are safe for you. Do exercises exactly as told by your provider and adjust them as told. Do not begin these exercises until told by your provider. Knuckle bend or "claw" fist  Stand or sit with your arm, hand, and all five fingers pointed straight up. Make sure to keep your wrist straight. Gently bend  your fingers down toward your palm until the tips of your fingers are touching your palm. Keep your big knuckle straight and only bend the small knuckles in your fingers. Hold this position for 10 seconds. Straighten your fingers back to your starting position. Repeat this exercise 5-10 times with each hand. Full finger fist  Stand or sit with your arm, hand, and all five fingers pointed straight up. Make sure to keep your wrist straight. Gently bend your fingers into your palm until the tips of your fingers are touching the middle of your  palm. Hold this position for 10 seconds. Extend your fingers back to your starting position, stretching every joint fully. Repeat this exercise 5-10 times with each hand. Straight fist  Stand or sit with your arm, hand, and all five fingers pointed straight up. Make sure to keep your wrist straight. Gently bend your fingers at the big knuckle, where your fingers meet your hand, and at the middle knuckle. Keep the knuckle at the tips of your fingers straight and try to touch the bottom of your palm. Hold this position for 10 seconds. Extend your fingers back to your starting position, stretching every joint fully. Repeat this exercise 5-10 times with each hand. Tabletop  Stand or sit with your arm, hand, and all five fingers pointed straight up. Make sure to keep your wrist straight. Gently bend your fingers at the big knuckle, where your fingers meet your hand, as far down as you can. Keep the small knuckles in your fingers straight. Think of forming a tabletop with your fingers. Hold this position for 10 seconds. Extend your fingers back to your starting position, stretching every joint fully. Repeat this exercise 5-10 times with each hand. Finger spread  Place your hand flat on a table with your palm facing down. Make sure your wrist stays straight. Spread your fingers and thumb apart from each other as far as you can until you feel a gentle stretch.  Hold this position for 10 seconds. Bring your fingers and thumb tight together again. Hold this position for 10 seconds. Repeat this exercise 5-10 times with each hand. Making circles  Stand or sit with your arm, hand, and all five fingers pointed straight up. Make sure to keep your wrist straight. Make a circle by touching the tip of your thumb to the tip of your index finger. Hold for 10 seconds. Then open your hand wide. Repeat this motion with your thumb and each of your fingers. Repeat this exercise 5-10 times with each hand. Thumb motion  Sit with your forearm resting on a table and your wrist straight. Your thumb should be facing up toward the ceiling. Keep your fingers relaxed as you move your thumb. Lift your thumb up as high as you can toward the ceiling. Hold for 10 seconds. Bend your thumb across your palm as far as you can, reaching the tip of your thumb for the small finger (pinkie) side of your palm. Hold for 10 seconds. Repeat this exercise 5-10 times with each hand. Grip strengthening  Hold a stress ball or other soft ball in the middle of your hand. Slowly increase the pressure, squeezing the ball as much as you can without causing pain. Think of bringing the tips of your fingers into the middle of your palm. All of your finger joints should bend when doing this exercise. Hold your squeeze for 10 seconds, then relax. Repeat this exercise 5-10 times with each hand. Contact a health care provider if: Your hand pain or discomfort gets much worse when you do an exercise. Your hand pain or discomfort does not improve within 2 hours after you exercise. If you have either of these problems, stop doing these exercises right away. Do not do them again unless your provider says that you can. Get help right away if: You develop sudden, severe hand pain or swelling. If this happens, stop doing these exercises right away. Do not do them again unless your provider says that you  can. This information is not intended to replace advice  given to you by your health care provider. Make sure you discuss any questions you have with your health care provider. Document Revised: 05/11/2022 Document Reviewed: 05/11/2022 [employer] Patient Education  2024 [employer].

## 2024-06-01 ENCOUNTER — Other Ambulatory Visit: Payer: Self-pay | Admitting: Hematology and Oncology

## 2024-08-20 ENCOUNTER — Ambulatory Visit: Attending: General Surgery

## 2024-09-19 ENCOUNTER — Ambulatory Visit: Admitting: Rheumatology

## 2025-03-20 ENCOUNTER — Inpatient Hospital Stay: Admitting: Hematology and Oncology
